# Patient Record
Sex: Male | Born: 1945 | Race: White | Hispanic: No | Marital: Married | Smoking: Former smoker
Health system: Southern US, Community
[De-identification: ages and names within clinical notes are randomized; demographics above are authoritative.]

## PROBLEM LIST (undated history)

## (undated) DIAGNOSIS — F419 Anxiety disorder, unspecified: Secondary | ICD-10-CM

## (undated) DIAGNOSIS — I2109 ST elevation (STEMI) myocardial infarction involving other coronary artery of anterior wall: Secondary | ICD-10-CM

## (undated) DIAGNOSIS — J45909 Unspecified asthma, uncomplicated: Secondary | ICD-10-CM

## (undated) DIAGNOSIS — K759 Inflammatory liver disease, unspecified: Secondary | ICD-10-CM

## (undated) DIAGNOSIS — I509 Heart failure, unspecified: Secondary | ICD-10-CM

## (undated) DIAGNOSIS — I4891 Unspecified atrial fibrillation: Secondary | ICD-10-CM

## (undated) DIAGNOSIS — E7800 Pure hypercholesterolemia, unspecified: Secondary | ICD-10-CM

## (undated) DIAGNOSIS — I429 Cardiomyopathy, unspecified: Secondary | ICD-10-CM

## (undated) DIAGNOSIS — I472 Ventricular tachycardia, unspecified: Secondary | ICD-10-CM

## (undated) DIAGNOSIS — E119 Type 2 diabetes mellitus without complications: Secondary | ICD-10-CM

## (undated) DIAGNOSIS — Z95810 Presence of automatic (implantable) cardiac defibrillator: Secondary | ICD-10-CM

## (undated) DIAGNOSIS — D649 Anemia, unspecified: Secondary | ICD-10-CM

## (undated) DIAGNOSIS — I2510 Atherosclerotic heart disease of native coronary artery without angina pectoris: Secondary | ICD-10-CM

## (undated) DIAGNOSIS — I442 Atrioventricular block, complete: Secondary | ICD-10-CM

## (undated) DIAGNOSIS — Z87442 Personal history of urinary calculi: Secondary | ICD-10-CM

## (undated) DIAGNOSIS — J189 Pneumonia, unspecified organism: Secondary | ICD-10-CM

## (undated) DIAGNOSIS — I10 Essential (primary) hypertension: Secondary | ICD-10-CM

## (undated) DIAGNOSIS — K819 Cholecystitis, unspecified: Secondary | ICD-10-CM

## (undated) HISTORY — DX: ST elevation (STEMI) myocardial infarction involving other coronary artery of anterior wall: I21.09

## (undated) HISTORY — DX: Anxiety disorder, unspecified: F41.9

## (undated) HISTORY — PX: CATARACT EXTRACTION W/ INTRAOCULAR LENS  IMPLANT, BILATERAL: SHX1307

## (undated) HISTORY — PX: CARDIAC CATHETERIZATION: SHX172

## (undated) HISTORY — PX: BI-VENTRICULAR IMPLANTABLE CARDIOVERTER DEFIBRILLATOR  (CRT-D): SHX5747

## (undated) HISTORY — PX: EYE SURGERY: SHX253

## (undated) HISTORY — PX: CORONARY ARTERY BYPASS GRAFT: SHX141

## (undated) HISTORY — DX: Atherosclerotic heart disease of native coronary artery without angina pectoris: I25.10

---

## 1999-12-27 ENCOUNTER — Encounter: Payer: Self-pay | Admitting: Cardiology

## 1999-12-27 ENCOUNTER — Ambulatory Visit (HOSPITAL_COMMUNITY): Admission: RE | Admit: 1999-12-27 | Discharge: 1999-12-27 | Payer: Self-pay | Admitting: Cardiology

## 2002-10-15 ENCOUNTER — Encounter (INDEPENDENT_AMBULATORY_CARE_PROVIDER_SITE_OTHER): Payer: Self-pay | Admitting: Cardiology

## 2002-10-15 ENCOUNTER — Ambulatory Visit (HOSPITAL_COMMUNITY): Admission: RE | Admit: 2002-10-15 | Discharge: 2002-10-15 | Payer: Self-pay | Admitting: Cardiology

## 2002-10-18 ENCOUNTER — Ambulatory Visit (HOSPITAL_COMMUNITY): Admission: RE | Admit: 2002-10-18 | Discharge: 2002-10-18 | Payer: Self-pay | Admitting: Cardiology

## 2002-10-18 ENCOUNTER — Encounter: Payer: Self-pay | Admitting: Cardiology

## 2003-01-07 ENCOUNTER — Ambulatory Visit (HOSPITAL_COMMUNITY): Admission: RE | Admit: 2003-01-07 | Discharge: 2003-01-07 | Payer: Self-pay | Admitting: Cardiology

## 2003-09-08 ENCOUNTER — Ambulatory Visit (HOSPITAL_COMMUNITY): Admission: RE | Admit: 2003-09-08 | Discharge: 2003-09-08 | Payer: Self-pay | Admitting: Cardiology

## 2003-09-26 ENCOUNTER — Ambulatory Visit (HOSPITAL_COMMUNITY): Admission: RE | Admit: 2003-09-26 | Discharge: 2003-09-26 | Payer: Self-pay | Admitting: Internal Medicine

## 2003-10-05 ENCOUNTER — Encounter: Admission: RE | Admit: 2003-10-05 | Discharge: 2003-10-05 | Payer: Self-pay | Admitting: Gastroenterology

## 2003-10-26 ENCOUNTER — Inpatient Hospital Stay (HOSPITAL_COMMUNITY): Admission: RE | Admit: 2003-10-26 | Discharge: 2003-10-27 | Payer: Self-pay | Admitting: Internal Medicine

## 2004-06-13 ENCOUNTER — Encounter: Admission: RE | Admit: 2004-06-13 | Discharge: 2004-06-13 | Payer: Self-pay | Admitting: Cardiology

## 2004-12-12 ENCOUNTER — Ambulatory Visit: Payer: Self-pay | Admitting: Internal Medicine

## 2005-12-03 ENCOUNTER — Ambulatory Visit: Payer: Self-pay | Admitting: Internal Medicine

## 2006-05-29 ENCOUNTER — Ambulatory Visit: Payer: Self-pay

## 2006-09-02 ENCOUNTER — Encounter: Admission: RE | Admit: 2006-09-02 | Discharge: 2006-09-02 | Payer: Self-pay | Admitting: Cardiology

## 2007-01-12 ENCOUNTER — Ambulatory Visit (HOSPITAL_COMMUNITY): Admission: RE | Admit: 2007-01-12 | Discharge: 2007-01-12 | Payer: Self-pay | Admitting: Cardiology

## 2007-03-24 ENCOUNTER — Encounter: Admission: RE | Admit: 2007-03-24 | Discharge: 2007-03-24 | Payer: Self-pay | Admitting: Gastroenterology

## 2007-03-26 ENCOUNTER — Ambulatory Visit: Payer: Self-pay | Admitting: Internal Medicine

## 2008-03-03 ENCOUNTER — Ambulatory Visit: Payer: Self-pay | Admitting: Internal Medicine

## 2008-03-09 ENCOUNTER — Ambulatory Visit: Payer: Self-pay | Admitting: Internal Medicine

## 2008-03-09 ENCOUNTER — Ambulatory Visit (HOSPITAL_COMMUNITY): Admission: RE | Admit: 2008-03-09 | Discharge: 2008-03-09 | Payer: Self-pay | Admitting: Internal Medicine

## 2008-03-11 ENCOUNTER — Ambulatory Visit: Payer: Self-pay | Admitting: Internal Medicine

## 2008-03-28 ENCOUNTER — Ambulatory Visit: Payer: Self-pay

## 2008-07-19 ENCOUNTER — Encounter: Payer: Self-pay | Admitting: Internal Medicine

## 2008-12-05 ENCOUNTER — Encounter: Admission: RE | Admit: 2008-12-05 | Discharge: 2008-12-05 | Payer: Self-pay | Admitting: Gastroenterology

## 2009-05-17 ENCOUNTER — Encounter (INDEPENDENT_AMBULATORY_CARE_PROVIDER_SITE_OTHER): Payer: Self-pay | Admitting: *Deleted

## 2009-10-02 ENCOUNTER — Encounter: Admission: RE | Admit: 2009-10-02 | Discharge: 2009-10-02 | Payer: Self-pay | Admitting: Gastroenterology

## 2010-01-24 ENCOUNTER — Encounter (INDEPENDENT_AMBULATORY_CARE_PROVIDER_SITE_OTHER): Payer: Self-pay | Admitting: *Deleted

## 2010-02-06 ENCOUNTER — Encounter: Payer: Self-pay | Admitting: Internal Medicine

## 2010-03-27 ENCOUNTER — Ambulatory Visit: Payer: Self-pay | Admitting: Internal Medicine

## 2010-03-28 DIAGNOSIS — I4891 Unspecified atrial fibrillation: Secondary | ICD-10-CM | POA: Insufficient documentation

## 2010-03-28 DIAGNOSIS — I5022 Chronic systolic (congestive) heart failure: Secondary | ICD-10-CM

## 2010-03-28 DIAGNOSIS — I10 Essential (primary) hypertension: Secondary | ICD-10-CM

## 2010-07-08 ENCOUNTER — Encounter: Payer: Self-pay | Admitting: Gastroenterology

## 2010-07-16 ENCOUNTER — Other Ambulatory Visit: Payer: Self-pay | Admitting: Cardiology

## 2010-07-16 DIAGNOSIS — I2510 Atherosclerotic heart disease of native coronary artery without angina pectoris: Secondary | ICD-10-CM

## 2010-07-17 NOTE — Cardiovascular Report (Signed)
Summary: Office Visit   Office Visit   Imported By: Roderic Ovens 03/30/2010 11:43:38  _____________________________________________________________________  External Attachment:    Type:   Image     Comment:   External Document

## 2010-07-17 NOTE — Assessment & Plan Note (Signed)
Summary: past due pacer ck   Visit Type:  Follow-up   History of Present Illness: Spencer Shelton returns today for followup.  He is a pleasant middle aged man with a h/o DCM, CHF, HTN and PAF.  He is s/p BiV ICD.  He returns today for followup.  He denies c/p.  He has class 2 CHF.  He denies peripheral edema. No intercurrent ICD therapies.  Current Medications (verified): 1)  Ursodiol 300 Mg Caps (Ursodiol) .... Two Times A Day 2)  Warfarin Sodium 6 Mg Tabs (Warfarin Sodium) .... Use As Directed By Anticoagulation Clinic 3)  Zetia 10 Mg Tabs (Ezetimibe) .... Take One Tablet By Mouth Daily. 4)  Digoxin 0.25 Mg Tabs (Digoxin) .... Take One Tablet By Mouth Daily 5)  Aspirin 81 Mg Tbec (Aspirin) .... Take One Tablet By Mouth Daily 6)  Benazepril Hcl 40 Mg Tabs (Benazepril Hcl) .... 1/2 Tab Two Times A Day 7)  Furosemide 80 Mg Tabs (Furosemide) .... Take One Tablet By Mouth Daily. 8)  Metoprolol Succinate 50 Mg Xr24h-Tab (Metoprolol Succinate) .... Take 1/2 Tablet By Mouth Two Times A Day 9)  Fish Oil   Oil (Fish Oil) .... Once Daily 10)  Diovan 160 Mg Tabs (Valsartan) .... Take One Tablet By Mouth Daily 11)  Multivitamins   Tabs (Multiple Vitamin) .... Once Daily  Allergies: 1)  ! * All Statins 2)  ! * Penicillin 3)  ! * Sulfa 4)  ! * Tricor  Past History:  Past Medical History: Last updated: 04/26/2009  Medtronic InSync device  Implant of Medtronic CONCERTO CRT-D system  Review of Systems       The patient complains of dyspnea on exertion.  The patient denies chest pain, syncope, and peripheral edema.    Vital Signs:  Patient profile:   65 year old male Height:      76 inches Weight:      197 pounds BMI:     24.07 Pulse rate:   68 / minute BP sitting:   110 / 70  (left arm)  Vitals Entered By: Laurance Flatten CMA (March 27, 2010 3:22 PM)  Physical Exam  General:  Well developed, well nourished, in no acute distress.  HEENT: normal Neck: supple. No JVD. Carotids 2+  bilaterally no bruits Cor: RRR no rubs, gallops or murmur.PMI is enlarged and laterally displaced.  Well healed ICD incision. Lungs: CTA Ab: soft, nontender. nondistended. No HSM. Good bowel sounds Ext: warm. no cyanosis, clubbing or edema Neuro: alert and oriented. Grossly nonfocal. affect pleasant     ICD Specifications ICD Vendor:  Medtronic     ICD Model Number:  [id number]     ICD Serial Number:  [id number] ICD DOI:  03/09/2008     ICD Implanting MD:  Lewayne Bunting, MD  Lead 1:    Location: RA     DOI: 10/26/2003     Model #: [id number]     Serial #: [id number]     Status: active Lead 2:    Location: RV     DOI: 10/26/2003     Model #: [id number]     Serial #: [id number]     Status: active Lead 3:    Location: LV     DOI: 10/26/2003     Model #: [id number]     Serial #: [id number]     Status: active  Indications::  ICM, AFIB, CHF   ICD Follow Up Battery Voltage:  3.10 V     Charge  Time:  10.2 seconds     Underlying rhythm:  SR   ICD Device Measurements Atrium:  Amplitude: 1.1 mV, Impedance: 456 ohms,  Right Ventricle:  Amplitude: 13.3 mV, Impedance: 496 ohms, Threshold: 1.0 V at 0.4 msec Left Ventricle:  Impedance: 416 ohms, Threshold: 1.0 V at 0.4 msec  Episodes MS Episodes:  1     Percent Mode Switch:  100%     Shock:  0     ATP:  0     Nonsustained:  1     Atrial Therapies:  0 Ventricular Pacing:  97.8%  Brady Parameters Mode VVIR     Lower Rate Limit:  70     Upper Rate Limit 120  Tachy Zones VF:  >188     VT:  162-188     Next Remote Date:  06/28/2010     Next Cardiology Appt Due:  03/18/2011 Tech Comments:  OPTIVOL INCREASE 03-07-10 THRU 03-13-10.  1 NST EPISODE.  PT IN AF 100% OF TIME. + COUMADIN NORMAL DEVICE FUNCTION.  CHANGED MAX LEAD IMPEDANCES FOR LIA AND TURNED 1:1 SVT ON. CARELINK 06-28-10 AND ROV IN 12 MTHS W/GT. Vella Kohler  March 27, 2010 3:41 PM MD Comments:  AGree with above.  Impression & Recommendations:  Problem # 1:  CHRONIC SYSTOLIC HEART FAILURE  (ICD-428.22) His symptoms remain class 2.  Continue a low sodium diet and medications as below. His updated medication list for this problem includes:    Warfarin Sodium 6 Mg Tabs (Warfarin sodium) ..... Use as directed by anticoagulation clinic    Digoxin 0.25 Mg Tabs (Digoxin) .[name]... Take one tablet by mouth daily    Aspirin 81 Mg Tbec (Aspirin) .[name]... Take one tablet by mouth daily    Benazepril Hcl 40 Mg Tabs (Benazepril hcl) .[name]... 1/2 tab two times a day    Furosemide 80 Mg Tabs (Furosemide) .[name]... Take one tablet by mouth daily.    Metoprolol Succinate 50 Mg Xr24h-tab (Metoprolol succinate) .[name]... Take 1/2 tablet by mouth two times a day    Diovan 160 Mg Tabs (Valsartan) .[name]... Take one tablet by mouth daily  Problem # 2:  ESSENTIAL HYPERTENSION, BENIGN (ICD-401.1) His blood pressure is well controlled.  Continue meds as below and maintain a low sodium diet.  His updated medication list for this problem includes:    Aspirin 81 Mg Tbec (Aspirin) .[name]... Take one tablet by mouth daily    Benazepril Hcl 40 Mg Tabs (Benazepril hcl) .[name]... 1/2 tab two times a day    Furosemide 80 Mg Tabs (Furosemide) .[name]... Take one tablet by mouth daily.    Metoprolol Succinate 50 Mg Xr24h-tab (Metoprolol succinate) .[name]... Take 1/2 tablet by mouth two times a day    Diovan 160 Mg Tabs (Valsartan) .[name]... Take one tablet by mouth daily  Patient Instructions: 1)  Your physician wants you to follow-up in: 12 months with Dr Court Joy will receive a reminder letter in the mail two months in advance. If you don't receive a letter, please call our office to schedule the follow-up appointment. 2)  [facility] 06/28/10

## 2010-07-17 NOTE — Letter (Signed)
Summary: Device-Delinquent Check  [facility], Main Office  [address]   [place], [place] [address]   Phone: [phone]  Fax: [phone]     January 24, 2010 MRN: [id number]   LANORRIS KALISZ [address] [place], [place]  [address]   Dear Mr. MOSS,  According to our records, you have not had your implanted device checked in the recommended period of time.  We are unable to determine appropriate device function without checking your device on a regular basis.  Please call our office to schedule an appointment, with Dr. Ladona Ridgel, as soon as possible.  If you are having your device checked by another physician, please call us so that we may update our records.  Thank you,  Altha Harm, LPN  January 24, 2010 1:51 PM  [facility]

## 2010-07-17 NOTE — Cardiovascular Report (Signed)
Summary: Certified Letter Delivered - Appt. made & Kept it  Certified Letter Delivered - Appt. made & Kept it   Imported By: Debby Freiberg 04/20/2010 10:01:46  _____________________________________________________________________  External Attachment:    Type:   Image     Comment:   External Document

## 2010-07-21 ENCOUNTER — Encounter: Payer: Self-pay | Admitting: Cardiology

## 2010-07-23 ENCOUNTER — Other Ambulatory Visit: Payer: Self-pay | Admitting: Cardiology

## 2010-07-23 ENCOUNTER — Ambulatory Visit
Admission: RE | Admit: 2010-07-23 | Discharge: 2010-07-23 | Disposition: A | Discharge status: Home / Self Care | Payer: MEDICARE | Source: Ambulatory Visit | Attending: Cardiology | Admitting: Cardiology

## 2010-07-23 ENCOUNTER — Other Ambulatory Visit: Payer: Self-pay

## 2010-07-23 DIAGNOSIS — I714 Abdominal aortic aneurysm, without rupture, unspecified: Secondary | ICD-10-CM

## 2010-07-23 DIAGNOSIS — I2510 Atherosclerotic heart disease of native coronary artery without angina pectoris: Secondary | ICD-10-CM

## 2010-10-30 NOTE — Op Note (Signed)
NAMEPIERO, MUSTARD NO.:  [id number]   MEDICAL RECORD NO.:  [id number]          PATIENT TYPE:  OIB   LOCATION:  2899                         FACILITY:  [facility]   PHYSICIAN:  Doylene Canning. Ladona Ridgel, MD    DATE OF BIRTH:  August 21, 1945   DATE OF PROCEDURE:  03/09/2008  DATE OF DISCHARGE:                               OPERATIVE REPORT   PROCEDURE PERFORMED:  Removal of previously implanted biventricular  implantable cardioverter-defibrillator, which is elective replacement  indicator and insertion of a new device with defibrillation threshold  testing.   INTRODUCTION:  The patient is a 62-year male with a longstanding  cardiomyopathy, severe LV dysfunction, congestive heart failure, and now  permanent AFib.  He is status post BiV ICD insertion approximately 4-1/2  years ago and now referred for ICD generator removal.  Of note, the  patient does have a [phone] Sprint Fidelis lead; however, it has functioned  normally, and after careful consideration of the options, it was deemed  most appropriate to remove the generator and insert a new one without  placing the new defibrillator lead or rate sensing lead, as his current  lead has been working satisfactorily.   PROCEDURE:  After informed consent was obtained, the patient was taken  to the Diagnostic EP Lab in a fasting state.  After usual preparation  and draping, intravenous midazolam was given for sedation.  A 30 mL of  the lidocaine was infiltrated over the old infraclavicular insertion  site.  A 7-cm incision was carried out over this region.  Electrocautery  was utilized to dissect down to the fascial plane.  Pocket was entered  without difficulty and electrocautery was utilized to free up the  generator from the pocket itself.  The generator was removed without  difficulty.  The leads were freed up from their fibrous adhesions, which  were fairly minimal.  The leads were evaluated.  The R-waves measured 25  millivolts and  the threshold was 0.6 volts at 0.5 milliseconds.  Pacing  impedance was 536 ohms and multiple impedances were obtained, they were  stable.  With demonstration of satisfactory pacing impedances, the old  Medtronic biventricular ICD was removed and the new Medtronic Concerto  BiV ICD serial number [id number] was connected to the atrial RV and LV  leads and placed back in the subcutaneous pocket.  Generator was secured  with silk suture.  The pocket was irrigated with copious amounts of  gentamicin irrigation.  Defibrillation threshold testing was carried  out.   After the patient was more deeply sedated with fentanyl and Versed, VF  was induced with a T-wave shock.  A 15-joule shock was delivered, which  terminated the VF and restored AFib.  At this point, no additional  defibrillation threshold testing was carried out and the incision was  closed with 2-0 Vicryl followed by a layer of 3-0 Vicryl.  Benzoin was  painted on the skin.  A pressure dressing was applied, and the patient  was returned to his room in satisfactory condition.   COMPLICATIONS:  There were no immediate procedure complications.  RESULTS:  Successful removal of previously implanted Medtronic BiV ICD,  which reached ERI and followed by successful insertion of a new device  without immediate procedure complication.      Doylene Canning. Ladona Ridgel, MD  Electronically Signed     GWT/MEDQ  D:  03/09/2008  T:  03/09/2008  Job:  [id number]   cc:   Georga Hacking, M.D.

## 2010-10-30 NOTE — Discharge Summary (Signed)
NAMESANUEL, LADNIER NO.:  [id number]   MEDICAL RECORD NO.:  [id number]          PATIENT TYPE:  OIB   LOCATION:  2899                         FACILITY:  [facility]   PHYSICIAN:  Doylene Canning. Ladona Ridgel, MD    DATE OF BIRTH:  May 08, 1946   DATE OF ADMISSION:  03/09/2008  DATE OF DISCHARGE:  03/09/2008                               DISCHARGE SUMMARY   ALLERGIES:  The patient has allergies to ALL STATINS, also to  PENICILLIN, SULFA, and TRICOR.   FINAL DIAGNOSES:  1. Explant of existing Medtronic InSync device, which is at elective      replacement indicator.  2. Implant of Medtronic CONCERTO CRT-D system, Dr. Lewayne Bunting.      Defibrillator threshold study, less than or equal to 15 joules.   SECONDARY DIAGNOSES:  1. Coronary artery disease.      a.     Coronary artery bypass grafting in 1985.      b.     Redo coronary artery bypass grafting in 1995.  2. Original implant of biventricular implantable cardioverter-      defibrillator in May 1995.  3. New York Heart Association class II congestive heart failure.  4. Ejection fraction 20% in April 2005.  5. Diabetes.  6. Dyslipidemia.  7. Atrial fibrillation, chronic Coumadin therapy.   PROCEDURE:  On March 09, 2008, cardioverter-defibrillator change  with explantation of the existing Medtronic InSync device and implant of  a Medtronic Concerto, which has cardiac resynchronization capability,  Dr. Lewayne Bunting.  Defibrillator threshold study was done, less than or  equal to 15 joules.   BRIEF HISTORY:  Mr. Granja is a 65 year old male.  He has a history of  premature atherosclerotic coronary artery disease.  His original  coronary artery bypass graft surgery was in 1985 after his first heart  attack.  He had a second heart attack in 1995 and had a redo bypass  surgery at that time.  A BiV ICD was implanted at that time as well in  May 2005.   The patient has ischemic cardiomyopathy, ejection fraction is 20%.  He  has a history of atrial fibrillation.  His New York Heart Association  class advances to III when he is in atrial fibrillation.  The patient  has a history of diabetes and dyslipidemia.   The patient awakened on Thursday, February 25, 2008.  He awakened  because there was beeping.  This has repeated itself every morning at  8:46 in the morning, this comes from his device.  The device was  interrogated on March 03, 2008, and found to be at elective  replacement indicator.  His 6949 right ventricular lead is intact on  interrogation.  The patient will present for ICD generator change by Dr.  Ladona Ridgel.   HOSPITAL COURSE:  The patient presented electively on March 09, 2008.  He had the existing cardioverter-defibrillator generator  explanted with implant of the new Medtronic Concerto device.  He has had  no postprocedural complication and was discharged on March 09, 2008.   He is asked to remove the  bandage on the morning of Thursday, March 10, 2008, and leave the incision open to the air.  He is asked to keep  his incision dry for the next 7 days and sponge bathe until Wednesday,  March 16, 2008.  He is to hold off taking Coumadin until Friday,  March 11, 2008, and then to restart it.  He has a new medication now  Keflex 500 mg tablets 1 tablet three times daily for the next 5 days.  He is also asked because of relative hypotension to hold off on taking  his afternoon Lopressor and Lotensin the afternoon of March 09, 2008.   Other medications are as follows:  1. He takes Coumadin 6 mg daily Tuesday through Sunday, but on Monday      9 mg.  2. Lanoxin 0.25 mg daily.  3. Lasix 40 mg twice daily.  4. Lopressor 50 mg tablets one-half tablet twice daily.  5. Lotensin 20 mg twice daily.  6. Aspirin 81 mg daily.  7. Zetia 10 mg daily.  8. Atacand 8 mg twice daily.  9. Omeprazole 20 mg daily.  10.Fish oil capsule 1250 mg daily.  11.Multivitamin daily.   12.Nitroglycerin 0.4 mg daily.   He has followup at [facility] on Thursday, March 31, 2008, at 9 o'clock.      Maple Mirza, PA      Doylene Canning. Ladona Ridgel, MD  Electronically Signed    GM/MEDQ  D:  03/09/2008  T:  03/10/2008  Job:  [id number]   cc:   Georga Hacking, M.D.

## 2010-10-30 NOTE — Cardiovascular Report (Signed)
NAMEMarland Kitchen  SAMER, DUTTON NO.:  [id number]   MEDICAL RECORD NO.:  [id number]          PATIENT TYPE:  OIB   LOCATION:  2852                         FACILITY:  [facility]   PHYSICIAN:  Georga Hacking, M.D.DATE OF BIRTH:  [birth year]   DATE OF PROCEDURE:  01/12/2007  DATE OF DISCHARGE:                            CARDIAC CATHETERIZATION   HISTORY:  65 year old male with previous bypass grafting in 1982 and  again in 1996.  He has had three hospital admissions with abdominal pain  and chest discomfort, and concern has arisen over an ischemic etiology  of this versus gallstones.  A previous Cardiolite scan has not shown  significant ischemia, but because of recurrent hospitalization, it was  recommended that he have catheterization to evaluate.   PROCEDURE:  Left heart catheterization with coronary angiograms, left  ventriculogram, saphenous vein graft angiograms x3, and internal mammary  artery angiogram.   PROCEDURE:  The right femoral artery was entered using a single anterior  needle wall stick.  The vein graft to obtuse marginal artery and to the  internal mammary graft were selected using a standard right catheter.  Right bypass catheter was used to select the right coronary artery and  diagonal branch.  The sheath was removed following the procedure in the  holding area.   HEMODYNAMIC DATA:  Aorta post-contrast was 110/53, LV post-contrast  110/213.   ANGIOGRAPHIC DATA:  Left ventriculogram:  Performed in the 30-degree RAO  projection.  Multiple defibrillator leads were noted from the  biventricular defibrillator.  The aortic valve was normal.  The mitral  valve is normal.  There was calcification of the aortic arch noted.  Left ventricular ejection fraction is estimated at about 35%.   Coronary arteries:  Arises to be normal and heavily calcified.  Left  main coronary artery occluded.  Right coronary artery occluded.   Saphenous vein graft to the obtuse  marginal artery is widely patent.  This is the graft that was placed in 1982.  The graft is widely patent  with the valve in the middle of it smooth and contains no significant  disease.  The vein graft to the diagonal branch arises off of the hood  of the graft to the obtuse marginal artery.  It is widely patent with  the valve in the midportion with no significant disease.  Saphenous vein  graft to the posterior descending artery is widely patent with patent  proximal and distal anastomotic site.  The vein was anastomosed to the  hood of a previously placed vein graft, where a nubbin of this is seen  that represents the old vein graft.  There are also two other nubbins  demonstrated that demonstrated the other nubbins of the vein grafts  placed in 1982.  The internal mammary graft to the LAD is widely patent.  The distal anastomotic site is widely patent.  The old sequential  saphenous vein graft to the diagonal and LAD is occluded at the aorta,  but the limb of the graft connecting the diagonal to the LAD retrograde  has a severe 99% stenosis and  fills retrograde after the insertion site  of the mammary arch to the LAD and fills another small diagonal branch.   IMPRESSION:  1. Occlusion of native coronary arteries.  2. Patent internal mammary graft to left anterior descending, patent      saphenous vein graft to diagonal, obtuse marginal, and posterior      descending artery.  3. Residual ischemia could exist in a small diagonal branch that fills      by the previous limb of the vein graft from the left anterior      descending to the diagonal, and in the proximal right coronary      system that fills retrograde from the previous bypass graft and has      a stenosis between the two.   RECOMMENDATIONS:  He will have PIPIDA scan done later on and will  continue to be treated medically.      Georga Hacking, M.D.  Electronically Signed     WST/MEDQ  D:  01/12/2007  T:   01/12/2007  Job:  [id number]   cc:   Reginia Forts, MD

## 2010-10-30 NOTE — Assessment & Plan Note (Signed)
[facility]                         ELECTROPHYSIOLOGY OFFICE NOTE   Spencer Shelton, Spencer Shelton                        MRN:          [id number]  DATE:03/03/2008                            DOB:          28-Aug-1945    ALLERGIES:  This patient has allergies to ALL STATINS, also to  PENICILLIN, SULFA, and TRICOR.   CARDIOLOGIST:  Georga Hacking, MD   ELECTROPHYSIOLOGIST:  Doylene Canning. Ladona Ridgel, MD   This is a 65 year old male.  He has a history of premature  atherosclerotic coronary artery disease.  He had an original heart  attack and subsequent coronary artery bypass graft surgery in 1985.  He  had a redo surgery after a second myocardial infarction in 1995.  He had  a BiV/ICD implant in May 2005 for ischemic cardiomyopathy and New York  Heart Association Class II congestive heart failure.  His ejection  fraction was 20%.  Catheterization was done in April 2005 just before  implant of his BiV/ICD.  The BiV/ICD is a Medtronic InSync device.  The  patient has a history of atrial fibrillation as well and when he is an  atrial fibrillation, he experiences New [place] Association Class is III.  He has diabetes and dyslipidemia.   The patient mentions that since his catheterization in April 2005, which  is on our chart, he has had other catheterizations, which showed that  his ejection fraction has improved.   Last Thursday, February 25, 2008, the patient awoke at 8:46 in the  morning, he is quite sure about that, because he was awakened by a  beeping sound.  This sound has repeated itself every morning at 8:46 and  it actually comes from his device.  This device was interrogated today  and it was shown to be at elective replacement indicator.  He has a 6949  right ventricular lead, which is intact at interrogation today.  The  patient presents for ICD generator change by Dr. Ladona Ridgel.   SOCIAL HISTORY:  The patient is married.  He is retired.  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  He does not smoke.  He does not take alcoholic beverages.  Interestingly enough, at the time  of his first heart attack, he weighed 326 pounds.  He lost 100 pounds in  40 days.  Currently, he weighs 203 pounds.  He is able to walk at will  and very active.   PHYSICAL EXAMINATION:  VITAL SIGNS:  Blood pressure is somewhat elevated  today at 155/80 and heart rate is 71.  This is concerning to him.  We  discussed the possibility of increasing his Atacand from 8 to 16 mg.  He  will take his blood pressure tonight when he gets back to [facility]  and he will be in contact with Dr. Donnie Aho regarding any changes in  changes in medications.  GENERAL:  Alert and oriented x3.  NECK:  Neck veins are relaxed.  No carotid bruits auscultated.  LUNGS:  Clear to auscultation bilaterally.  HEART:  Faintly regular rhythm.  ABDOMEN:  Soft and  nondistended.  Bowel sounds are present.  He shows no  lower extremity edema, and he has evidence of healing saphenous  venectomies.   MEDICATIONS:  1. Coumadin 6 mg daily, 9 mg on Monday.  2. Lanoxin 0.25 mg daily.  3. Latex and Lasix 40 mg twice daily.  4. Lopressor 50 mg one-and-a-half tablet twice daily.  5. Lotensin 20 mg twice daily.  6. Enteric-coated aspirin 81 mg daily.  7. Zetia 2 mg daily.  8. Atacand 8 mg daily.  9. Omeprazole 20 mg daily.  10.Fish oil daily.  11.Multivitamin daily.  12.He has nitroglycerin 0.4 mg sublingual as needed.   The patient will present at [facility] clock a.m. on March 09, 2008 for  pacemaker generator change.  If the patient needs a new lead to replace  the 6949, this will be decided by Dr. Ladona Ridgel.  In addition, the patient  is very concerned about his blood pressure today.  He notes his lunch at  [facility] seemed salty.  He will retake his blood pressure at home  tonight.  If it remains elevated he will call Dr York Spaniel office.  I  mentioned that he could easily increase his  atacand to l6 mg.      Maple Mirza, PA  Electronically Signed      Doylene Canning. Ladona Ridgel, MD  Electronically Signed   GM/MedQ  DD: 03/03/2008  DT: 03/04/2008  Job #: [id number]

## 2010-11-02 NOTE — Op Note (Signed)
   NAME:  Spencer Shelton, Spencer Shelton                           ACCOUNT NO.:  [id number]   MEDICAL RECORD NO.:  [id number]                   PATIENT TYPE:  OIB   LOCATION:  2899                                 FACILITY:  [facility]   PHYSICIAN:  Darden Palmer., M.D.         DATE OF BIRTH:  28-Jan-1946   DATE OF PROCEDURE:  01/07/2003  DATE OF DISCHARGE:                                 OPERATIVE REPORT   PROCEDURE:  Cardioversion.   INDICATIONS FOR PROCEDURE:  Atrial fibrillation. He has been on an increased  dose of amiodarone since the last attempt at cardioversion.   DESCRIPTION OF PROCEDURE:  The patient had anesthesia given by Dr. Arta Bruce in the short stay center with 350 mg of pentothal. Cardioversion was  done using AP patches with 3  sequential shocks of 300 to 360 and 360 mg. He  initially was not felt to have reverted to sinus rhythm, but subsequent EKG  showed the presence of small T-waves and a more regular rhythm which was  suggestive of sinus rhythm.   IMPRESSION:  Successful cardioversion of atrial fibrillation to sinus  rhythm.                                                Darden Palmer., M.D.    WST/MEDQ  D:  01/07/2003  T:  01/07/2003  Job:  [id number]

## 2010-11-02 NOTE — Op Note (Signed)
   NAME:  Spencer Shelton, Spencer Shelton                           ACCOUNT NO.:  [id number]   MEDICAL RECORD NO.:  [id number]                   PATIENT TYPE:  OIB   LOCATION:  2899                                 FACILITY:  [facility]   PHYSICIAN:  Darden Palmer., M.D.         DATE OF BIRTH:  1945/09/12   DATE OF PROCEDURE:  DATE OF DISCHARGE:  10/18/2002                                 OPERATIVE REPORT   PROCEDURE PERFORMED:  Electrical cardioversion.   INDICATIONS FOR PROCEDURE:  Atrial fibrillation.   This 65 year old male has had recurrent atrial fibrillation and  cardioversion three years ago.  He presented with recurrent atrial  fibrillation and has been on Coumadin with INR of 3.8 recently.  The patient  received anesthesia by Dr. Judie Petit with 450 mg of Pentothal.  Cardioversion was done using UP patches, 300W resulted in reversion to  atrial fibrillation but 360W successfully cardioverting to sinus rhythm.   IMPRESSION:  Successful cardioversion of atrial fibrillation.                                               Darden Palmer., M.D.    WST/MEDQ  D:  10/18/2002  T:  10/18/2002  Job:  [id number]

## 2010-11-02 NOTE — Procedures (Signed)
. [facility]  Patient:    Spencer Shelton, Spencer Shelton                        MRN: [id number] Proc. Date: 12/27/99 Adm. Date:  [id number] Attending:  Norman Clay                           Procedure Report  PROCEDURE:  Cardioversion.  HISTORY:  This 65 year old male with coronary artery disease and paroxysmal atrial fibrillation, developed CHF and worsening atrial fibrillation recently. He has been on amiodarone and Coumadin as an outpatient and is brought at this time for cardioversion.  DESCRIPTION OF PROCEDURE:  The patient received pentothal 250 mg under anesthetic monitoring by Janetta Hora. Gelene Mink, M.D.  Cardioversion was done using AP patches with 300 watts, resulting in reversion to sinus rhythm. There were occasional PVCs and junctional rhythm immediately after cardioversion.  He tolerated the procedure well.  IMPRESSION:  Successful cardioversion of atrial fibrillation. DD:  12/27/99 TD:  12/27/99 Job: [id number]

## 2010-11-02 NOTE — Cardiovascular Report (Signed)
NAME:  Spencer Shelton, Spencer Shelton                           ACCOUNT NO.:  [id number]   MEDICAL RECORD NO.:  [id number]                   PATIENT TYPE:  INP   LOCATION:  2899                                 FACILITY:  [facility]   PHYSICIAN:  W. Ashley Royalty., M.D.         DATE OF BIRTH:  [birth year]   DATE OF PROCEDURE:  09/26/2003  DATE OF DISCHARGE:                              CARDIAC CATHETERIZATION   HISTORY:  A 65 year old male with congestive heart failure and previous  bypass grafting nine years ago.  He has had worsening congestive heart  failure and some vague angina at times and study is done to evaluate for  graft patency prior to implantation of a permanent implantable defibrillator  and biventricular device.   PROCEDURE:  Left heart catheterization with coronary angiograms, left  ventriculogram, saphenous vein graft angiograms x3 and internal mammary  artery angiogram.   COMMENTS ABOUT PROCEDURE:  The patient tolerated the procedure well without  complications.   The vein graft to the right coronary artery and diagonal were selected using  a right coronary artery bypass catheter.  The internal mammary graft and the  graft to the OM were selected using a standard right catheter.  The patient  was due to have a defibrillator implanted later on today, but because of an  elevated white count and some sinusitis, it was opted to just take the  sheath out without closure because we did not want to leave a foreign body  in with possible infection and bring him back later on to do a defibrillator  implant.   HEMODYNAMIC DATA:  1. Aorta postcontrast:  136/60.  2. LV postcontrast:  136/16-26.   ANGIOGRAPHIC DATA:   LEFT VENTRICULOGRAM:  Performed in the 30 degree RAO projection.  The aortic  valve was normal.  The mitral valve appeared normal.  The left ventricle was  dilated.  There is severe global hypokinesis with an estimated ejection  fraction of 20%.  Coronary arteries are  heavily calcified.  The left main  coronary artery is occluded.  The right coronary artery is occluded  proximally.   Saphenous vein graft to the obtuse marginal branch:  This graft was  implanted in 1982 and is widely patent with a valve in the middle of it, but  it is smooth and contains no significant disease.   Saphenous vein graft to the diagonal branch is widely patent.  There is also  a valve in the mid portion with no significant disease.   Saphenous vein graft to the posterior descending artery:  The graft is  widely patent with a patent proximal and distal anastomotic site.  It  appears that this graft was anastomosed to the hood of the previously placed  vein graft whereas as also a nubbin of this is seen which represents the old  vein graft.   Left internal mammary graft to the LAD is widely patent.  The  distal  anastomotic site is widely patent.  The old sequential saphenous vein graft  to the diagonal and LAD is occluded at the aorta, but the limb of the graft  connecting the diagonal LAD is patent and fills retrograde after the  insertion site of the internal mammary to the LAD and fills another diagonal  branch.   Two other nubbins were demonstrated which represent the other nubbins of the  vein grafts placed in 1982.   IMPRESSION:  1. Occlusion of native coronary arteries.  2. Patent internal mammary graft to the left anterior descending and patent     saphenous vein grafts to the diagonal, obtuse marginal and posterior     descending artery.   RECOMMENDATIONS:  The patient will be observed following catheterization and  the sheath will be removed.  We will treat for sinusitis and also may need  to evaluate his elevated platelet count.  Following this, consider  implantation of a biventricular defibrillator.                                               Darden Palmer., M.D.    WST/MEDQ  D:  09/26/2003  T:  09/26/2003  Job:  [id number]

## 2010-11-02 NOTE — H&P (Signed)
[facility]  Patient:    Spencer Shelton, Spencer Shelton                       MRN: [id number] Adm. Date:  12/27/99 Attending:  Darden Palmer., M.D.                         History and Physical  REASON FOR ADMISSION:  For cardioversion.  HISTORY:  The patient is a 65 year old male with known history of coronary artery disease, congestive heart failure, and atrial fibrillation.  He had redo coronary artery bypass grafting in 1996 by Dr. Particia Lather, with a internal mammary graft to the LAD, a vein graft to the posterior descending, and a vein graft to the diagonal branch.  He had some atrial fibrillation at that time, and has been on chronic Coumadin since that time and has been on quinidine.  He had been getting along fairly well since that time, but presented to the office on Nov 02, 1999 with atrial fibrillation.  Lasix was increased at that time and he was noted to have significant edema.  An echocardiogram showed a dilated left ventricle with an EF of 30%.  He had distal anterior septal and apical and distal inferior hypokinesis.  The right ventricle was dilated, and there was thought to be severe tricuspid regurgitation and significant pulmonary hypertension.  Tonys quinidine was stopped and he was started on amiodarone.  He has now been anticoagulated, and has been on amiodarone now for over a month.  His dyspnea and breathing have improved significantly.  He developed fever about a week ago and had a headache and earache, and was placed on Zithromax and Claritin D.  He was later changed to plain Claritin and was also given a course of Augmentin.  He is feeling well at this time, but he is brought in at this time to treat atrial fibrillation with cardioversion.  PAST MEDICAL HISTORY:  Previous anterior infarctions.  He has had previous coronary artery disease with known occlusions of all of his native coronary arteries.  He has significant  situational stress and anxiety.  He has no history of diabetes or ulcers, no kidney stones.  PREVIOUS SURGICAL HISTORY:  Coronary artery bypass grafting in 1982 and again in 1996.  He had a wound infection of his right knee.  ALLERGIES:  SULFA and PENICILLIN.  CURRENT MEDICATIONS: 1. Lopressor 25 b.i.d. 2. Lotensin 10 mg b.i.d. 3. Lanoxin 0.25 mg daily. 4. Coumadin 5 mg Monday/Wednesday/Friday, 2.5 mg four days a week. 5. Multivitamins daily. 6. Lasix 40 mg b.i.d. 7. Amiodarone 200 mg b.i.d. 8. Tricor, which he stopped because of rash.  FAMILY AND SOCIAL HISTORY:  Are recorded in the old records.  REVIEW OF SYSTEMS:  He has had recent rash due to Tricor, for which he stopped it.  He has also complained of some fever and some nausea recently, which appears to have resolved.  He has not had cough productive of sputum. He has no diarrhea, constipation, or other GI problems.  PHYSICAL EXAMINATION:  GENERAL:  He is a pleasant male.  VITAL SIGNS:  Weight 204.  Blood pressure 120/73 and 120/77, pulse is 64 and irregular.  SKIN:  Warm and dry.  NECK:  There is no JVD noted today, no carotid bruits.  LUNGS:  Clear.  CARDIAC:  Irregular rhythm, no S3.  There is a 1-2/6 murmur.  ABDOMEN:  Soft,  nontender.  EXTREMITIES:  There are changes of chronic venous stasis noted with 1+ edema involving the left leg.  NEUROLOGIC:  Grossly normal.  GENITOURINARY AND RECTAL:  Deferred.  LABORATORY DATA:  A 12-lead ECG shows atrial fibrillation, right axis deviation, right bundle-branch block, old anteroseptal MI.  IMPRESSION: 1. Atrial fibrillation, for cardioversion, on amiodarone. 2. Congestive heart failure due to atrial fibrillation and previous MIs. 3. 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  Previous    coronary artery bypass grafting x 2. 4. Recent fever. 5. Chronic Coumadin anticoagulation.  RECOMMENDATIONS:  The patient is brought in at this time  for elective cardioversion.  The procedure was discussed with the patient fully, including risks of MI, death, or CVA, and he was willing to proceed. DD:  12/26/99 TD:  12/26/99 Job: [id number]

## 2010-11-02 NOTE — Op Note (Signed)
   NAME:  Spencer Shelton, Spencer Shelton                           ACCOUNT NO.:  [id number]   MEDICAL RECORD NO.:  [id number]                   PATIENT TYPE:  OIB   LOCATION:  2899                                 FACILITY:  [facility]   PHYSICIAN:  Darden Palmer., M.D.         DATE OF BIRTH:  1946-04-23   DATE OF PROCEDURE:  01/07/2003  DATE OF DISCHARGE:                                 OPERATIVE REPORT   PROCEDURE:  Cardioversion.   INDICATIONS FOR PROCEDURE:  Atrial fibrillation.   HISTORY OF PRESENT ILLNESS:  The patient is a 65 year old male who has been  in atrial fibrillation. His amiodarone dose was increased  and he was  brought back for another attempt at cardioversion.   DESCRIPTION OF PROCEDURE:  The patient underwent anesthesia by Dr. Arta Bruce in the short stay center with 350 mg of pentothal. Cardioversion was  done using AP patches with  sequential shocks of 300-360 and 360 watts. He  failed to revert to sinus rhythm with any of the shocks.   IMPRESSION:  Unsuccessful cardioversion for atrial fibrillation.                                                Darden Palmer., M.D.    WST/MEDQ  D:  01/07/2003  T:  01/07/2003  Job:  [id number]

## 2010-11-02 NOTE — Op Note (Signed)
NAMEMarland Kitchen  TELFORD, ARCHAMBEAU                           ACCOUNT NO.:  [id number]   MEDICAL RECORD NO.:  [id number]                   PATIENT TYPE:  INP   LOCATION:  2003                                 FACILITY:  [facility]   PHYSICIAN:  Doylene Canning. Ladona Ridgel, M.D.               DATE OF BIRTH:  1945-07-13   DATE OF PROCEDURE:  10/26/2003  DATE OF DISCHARGE:                                 OPERATIVE REPORT   PROCEDURE PERFORMED:  Implantation of a biventricular implantable  cardioverter-defibrillator.   INDICATIONS:  Ischemic cardiomyopathy, right bundle branch block, with left  posterior fascicular block, and class III heart failure, with persistent  atrial fibrillation.   INTRODUCTION:  The patient is a 65 year old male with a history of coronary  artery disease, status post bypass surgery now 20 years ago.  He has patent  grafts.  He has an EF of 25%.  He has class III heart failure with right  bundle branch block and left posterior fascicular block and is now referred  for bi-V ICD insertion.   PROCEDURE:  After informed consent was obtained, the patient was taken to  the diagnostic EP lab in a fasted state.  After the usual  preparation and  draping, intravenous fentanyl and midazolam were given for sedation.  Lidocaine 30 mL was infiltrated into the left infraclavicular region.  A 9  cm incision was carried out over this region and electrocautery utilized to  dissect down to the fascial plane.  The left subclavian vein was punctured  x3 with the Medtronic model 6949 65-cm active-fixation defibrillation lead,  serial number [id number], advanced into the right ventricle.  The Medtronic  model 5076 52-cm active-fixation lead, serial number [id number], advanced  into the right atrium.  Initially mapping was carried out in the right  ventricle and at the final site, the R-waves measured 12 mV with a pacing  impedance of 595 Ohms and a pacing threshold of 0.5 V at 0.5 msec. once the  lead was  actively fixed.  Ten-volt pacing did not stimulate the diaphragm.  At this point attention was turned to the atrial lead.  The patient was in  atrial fibrillation and it was placed in the lateral wall of the right  atrium, where fibrillation waves measured approximately 1 mV but varied  between 0.6 and 2 mV.  AOO pacing did not stimulate the diaphragm, and the  pacing impedance was 500 msec.  With both atrial and defibrillation leads in  a satisfactory position, attention was then turned to placement of the left  ventricular lead.  The coronary sinus was catheterized with a guiding  catheter and a CS EP catheter.  Venography of the coronary sinus was carried  out in the LAO and RAO projection.  This demonstrated a fairly moderate-  sized middle cardiac vein, very small posterior and posterolateral veins  unacceptable for bi-V pacing, and a slightly larger  lateral vein.  The Luge  angioplasty wire was then advanced by way of the guiding catheter into this  lateral vein.  The Medtronic model 4193 88-cm passive-fixation left  ventricular pacing lead, serial number [id number], was then advanced over  the guidewire into the lateral vein.  In this location, which was  approximately 2 o'clock on the mitral annulus, the pacing threshold was 1  volt at 0.5 msec with a pacing impedance of 448 Ohms.  Ten-volt pacing did  not stimulate the diaphragm at this location.  With the left ventricular and  right atrial and defibrillation leads in satisfactory position, they were  secured to the subpectoralis fascia with a figure-of-eight silk suture.  The  sewing sleeves were also secured with silk suture.  Electrocautery was then  utilized to make a subcutaneous pocket.  The Medtronic InSync Sentry model  [id number] biventricular defibrillator, serial number [id number], was then  connected to the atrial, LV, and defibrillation leads and placed back in the  subcutaneous pocket.  The generator was secured with a silk  suture and  defibrillation threshold testing carried out.   After the patient was more deeply sedated with fentanyl and Versed, VF was  induced with a T-wave shock.  Initially a 15-joule shock was delivered,  terminating both ventricular fibrillation and atrial fibrillation, and  restoring sinus rhythm.  At this point pacing thresholds were carried out in  the right atrium and found to be 1 V at 0.3 msec.  The P-waves measured 2.4  mV in the atrium in sinus rhythm.  Five minutes was allowed to elapse and a  second DFT test carried out.  Again VF was induced with a T-wave shock and a  10-joule shock then delivered, terminating ventricular fibrillation and  restoring sinus rhythm.  At this point additional kanamycin was utilized to  irrigate the incision and the incision closed with a layer of 2-0 Vicryl,  followed by a layer of 3-0 Vicryl, followed by a layer of 4-0 Vicryl.  Benzoin was painted on the skin and Steri-Strips were applied and a pressure  dressing placed, and the patient returned to his room in satisfactory  condition.   COMPLICATIONS:  There were no immediate procedural complications.   RESULTS:  This demonstrates successful implantation of a Medtronic  biventricular implantable cardioverter-defibrillator in a patient with class  III heart failure, ischemic cardiomyopathy, and bundle branch block with a  QRS duration of 158 msec.  The defibrillation threshold was less than or  equal to 10 joules.                                               Doylene Canning. Ladona Ridgel, M.D.    GWT/MEDQ  D:  10/26/2003  T:  10/27/2003  Job:  [id number]   cc:   W. Ashley Royalty., M.D.  [address]  [place]  [place] [address]  Fax: [phone]

## 2010-11-02 NOTE — Discharge Summary (Signed)
NAMEMarland Shelton  Spencer, Shelton                           ACCOUNT NO.:  [id number]   MEDICAL RECORD NO.:  [id number]                   PATIENT TYPE:  INP   LOCATION:  2003                                 FACILITY:  [facility]   PHYSICIAN:  Doylene Canning. Ladona Ridgel, M.D.               DATE OF BIRTH:  04/10/1946   DATE OF ADMISSION:  10/26/2003  DATE OF DISCHARGE:  10/27/2003                                 DISCHARGE SUMMARY   HISTORY OF PRESENT ILLNESS:  This is a 65 year old gentleman with a past  medical history of coronary artery disease, status post myocardial  infarction 20 years ago.  He had a CABG in 1985 with a redo in 1995 in the  setting of a second MI and developed postoperative atrial fibrillation.  He  has class II CHF but class III when he is atrial fibrillation and right  bundle branch block.  History of syncope and presyncope.  He is admitted for  placement of a biventricular ICD.   The patient underwent placement of Medtronics biventricular ICD and  tolerated the procedure well.  He had no immediate postoperative  complications and was discharged to home the following day in stable  condition.   DISCHARGE MEDICATIONS:  1. He was discharged on all of his previous medications amiodarone 200 mg     b.i.d.  2. Digoxin 0.25 mg q.d.  3. Lasix 40 mg b.i.d.  4. Lopressor 25 mg b.i.d.  5. Coated aspirin 81 mg q.d.  6. Lotensin 20 mg b.i.d.  7. Coumadin 3 mg nightly.  8. Tylenol 1-2 tablets q.4-6h. p.r.n. pain.   ACTIVITY:  Per the discharge sheet.  No driving for 10 days.   WOUND CARE:  Per the discharge sheet.   DIET:  He was to have a low fat, low salt, low cholesterol diet.   FOLLOW UP:  He is to be seen at the [facility] on Nov 11, 2003 at 9 a.m., Dr. Doylene Canning. Ladona Ridgel February 01, 2004 at 10:40 a.m.      Chinita Pester, C.R.N.P. [facility]                 Doylene Canning. Ladona Ridgel, M.D.    DS/MEDQ  D:  10/27/2003  T:  10/27/2003  Job:  [id number]   cc:   Doylene Canning. Ladona Ridgel, M.D.   Darden Palmer., M.D.  [address], [address]  [place]  [place] [address]  Fax: [phone]

## 2010-11-02 NOTE — Op Note (Signed)
NAME:  Spencer Shelton, Spencer Shelton                           ACCOUNT NO.:  [id number]   MEDICAL RECORD NO.:  [id number]                   PATIENT TYPE:  OIB   LOCATION:  2857                                 FACILITY:  [facility]   PHYSICIAN:  Darden Palmer., M.D.         DATE OF BIRTH:  1946-04-23   DATE OF PROCEDURE:  09/08/2003  DATE OF DISCHARGE:                                 OPERATIVE REPORT   PROCEDURE:  Cardioversion.   INDICATIONS FOR PROCEDURE:  Recurrent atrial fibrillation.   The patient was brought to the short stay center and was n.p.o. after  midnight.  After 200 mg of Pentothal was given by Dr. Diamantina Monks,  cardioversion was done using AP patches with 200 watts of biphasic  defibrillation using the Zoll defibrillator.  He converted to sinus rhythm  with a marked first degree AV block.  He tolerated this well and had no  complications.   IMPRESSION:  Successful cardioversion of atrial fibrillation.                                               Darden Palmer., M.D.    WST/MEDQ  D:  09/08/2003  T:  09/08/2003  Job:  [id number]

## 2010-11-02 NOTE — Consult Note (Signed)
NAME:  Spencer Shelton, Spencer Shelton                           ACCOUNT NO.:  [id number]   MEDICAL RECORD NO.:  [id number]                   PATIENT TYPE:  INP   LOCATION:  2899                                 FACILITY:  [facility]   PHYSICIAN:  Bernette Redbird, M.D.                DATE OF BIRTH:  03/04/1946   DATE OF CONSULTATION:  09/26/2003  DATE OF DISCHARGE:  09/26/2003                                   CONSULTATION   Dr. Donnie Aho asked me to see this 65 year old gentleman because of possible GI  diseases.   Spencer Shelton had an outpatient catheterization today with the intent of  placement of an implantable defibrillator. The main purpose for the  defibrillator, incidentally, was not any sort of malignant ventricular  arrhythmias but rather persistent atrial fibrillation.   It was noted on his preprocedural lab work that the patient had a white  count of 20,100 (no differential) and thrombocytosis with a platelet count  of 720,000. Because of the possibility of underlying GI malignancy being  reflected in this abnormal labs, it was elected to cancel the defibrillator  placement.   The patient does not have any recognized history of GI tract illnesses;  however, he has had a significant decreasing appetite over the past six  weeks. There is also been roughly 7-pound weight loss, but this is difficult  to access because of fluid retention in the abdomen with associated  diuresis. The patient's abdominal girth seems to have increased, but his  muscular size seems to have decreased.   On top of this, the patient has a six month history of hoarseness and  soreness in the back of his throat which leads to pain on swallowing. He  does not have esophageal odynophagia but rather the soreness is localized to  the back of the throat itself. There is no problem with dysphagia. There is  no problem with heartburn or reflux symptoms. No problem with eructation or  flatus. No problem with abdominal pain, melena, or  hematochezia. He does  tend toward constipation which is helped by the use of a couple of stool  softeners.   There is no family history of GI tract illnesses.   ALLERGIES:  PENICILLIN, SULFA, LIPITOR, and TRICOR.   OUTPATIENT MEDICATIONS:  1. Amiodarone.  2. Lopressor.  3. Lotensin.  4. Lanoxin.  5. Lasix.  6. Inspra.  7. Coumadin.  8. Daily baby aspirin.   OPERATION:  CABG x2.   MEDICAL ILLNESSES:  1. Coronary artery disease status post MI x2 (age of 83 and, I believe, 61).  2. Type 2 diabetes not requiring insulin, diet controlled, roughly 10 years     duration.  3. Hypercholesterolemia.  4. Atrial fibrillation.  5. Congestive heart failure with an ejection fraction of 20 to 25%.  6. Heart failure category 2 to 3.   HABITS:  Nonsmoker, but he formally smoked four packs per  day and was  substantially overweight at 321 pounds, now roughly 100 pounds less.   FAMILY HISTORY:  Negative for GI illnesses but positive for diabetes.   SOCIAL HISTORY:  The patient is married and is medically disabled. 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   REVIEW OF SYSTEMS:  Please see HPI. Note that the patient says the food  basically has no taste and is unappealing to him with about the only thing  that he can taste fairly well being ice cream.   PHYSICAL EXAMINATION:  GENERAL:  Basically unrevealing. Spencer Shelton is very  pleasant, lean but not frankly cachectic, Caucasian male with a pasty  complexion.  HEENT:  The oropharynx is benign. There is no scleral icterus or  conjunctival pallor. I do not appreciate any cervical adenopathy or  supraclavicular adenopathy.  CHEST:  Clear to auscultation.  HEART:  Slow rate around 60, soft holosystolic murmur along the left sternal  border with a S3 gallop.  ABDOMEN:  Liver and spleen nonpalpable. No guarding, mass, or tenderness. No  frank fluid wave or overt protuberance, but there is bilateral flank  dullness, and I think he probably  does have some ascites. No obvious  umbilical hernia.  RECTAL:  Not performed.  EXTREMITIES:  No peripheral edema. There is hyperpigmentation of the lower  shins.   LABORATORY DATA:  BUN 14, creatinine 1.2. albumin 2.5, bilirubin 1.5, AST  81, ALT 68, INR 1.9, white count 20,100 with no differential available.  Hemoglobin 12.4 with a MCV of 79 and a RDW of 16.2, platelets 720,000.   IMPRESSION:  1. Anorexia and weight loss.  2. Pharyngeal soreness and hoarseness.  3. Elevated liver chemistries and low albumin.  4. Leukocytosis and thrombocytosis.  5. Microcytic anemia, on long term aspirin and Coumadin.   DISCUSSION AND RECOMMENDATIONS:  The overall picture is nonspecific. The  patient clearly is having failure to thrive with his poor appetite, and it  is unclear whether his hoarseness and sore throat could be playing into  this. Possibilities which come to mind would include possible ENT  malignancy, reflux laryngitis, cardiac cirrhosis, cryptogenic cirrhosis, or  even hepatitis C possibly as a consequence of transfusions from his previous  CABG surgery. There is also the specter of occult malignancy. Finally, he  has some mild constipation which does not sound worrisome and is probably  related to his multiple medications.   PLAN:  1. Okay to resume Coumadin.  2. I have made arrangements for the patient to see me back in the office in     two days to do the following:     A. Iron studies and hepatitis serologies.     B. Home Hemoccult testing.     C. ENT referral.     D. Arrange CT scan of abdomen and pelvis.     E. Initiate MiraLax for laxation.     F. Consider upper GI series.   I appreciate the opportunity to have seen this patient in consultation with  you.                                               Bernette Redbird, M.D.    RB/MEDQ  D:  09/26/2003  T:  09/27/2003  Job:  [id number]   cc:   W. Ashley Royalty., M.D. [address]  [place]  [place]  [address]  Fax: [phone]   Loretta Plume, M.D.  [facility]

## 2011-01-07 ENCOUNTER — Telehealth: Payer: Self-pay | Admitting: Internal Medicine

## 2011-01-07 NOTE — Telephone Encounter (Signed)
Patient aware that pt is not in the office on July 30 th. Pt. Will keep his previous appointment date and time.

## 2011-01-07 NOTE — Telephone Encounter (Signed)
Left a message to call back.

## 2011-01-07 NOTE — Telephone Encounter (Signed)
Pt wants to move his appt up he is coming up on the 30th of July to see Dr. Donnie Aho and wants to come in that sameday to see Dr. Ladona Ridgel because he will be gone the whole month of Oct.

## 2011-03-18 LAB — PROTIME-INR: INR: 3.4 — ABNORMAL HIGH

## 2011-03-18 LAB — BASIC METABOLIC PANEL
Calcium: 9.4
Chloride: 104
Creatinine, Ser: 1.02
GFR calc Af Amer: >60
GFR calc non Af Amer: >60
Glucose, Bld: 146 — ABNORMAL HIGH
Potassium: 3.6

## 2011-03-18 LAB — APTT: aPTT: 59 — ABNORMAL HIGH

## 2011-03-18 LAB — CBC
MCHC: 34.2
MCV: 86.5
RBC: 4.92
WBC: 12.6 — ABNORMAL HIGH

## 2011-03-18 LAB — GLUCOSE, CAPILLARY: Glucose-Capillary: 180 — ABNORMAL HIGH

## 2011-04-01 LAB — BASIC METABOLIC PANEL
Calcium: 9.4
Chloride: 103
GFR calc Af Amer: >60
Glucose, Bld: 128 — ABNORMAL HIGH
Sodium: 139

## 2011-04-01 LAB — CBC
Hemoglobin: 13.7
MCHC: 33.7
MCV: 86.1
Platelets: 159
WBC: 9.1

## 2011-04-01 LAB — APTT: aPTT: 37

## 2011-04-01 LAB — PROTIME-INR: Prothrombin Time: 16.5 — ABNORMAL HIGH

## 2011-04-16 ENCOUNTER — Encounter: Payer: Self-pay | Admitting: Internal Medicine

## 2011-04-16 ENCOUNTER — Ambulatory Visit (INDEPENDENT_AMBULATORY_CARE_PROVIDER_SITE_OTHER): Payer: Medicare Other | Admitting: Internal Medicine

## 2011-04-16 DIAGNOSIS — I5022 Chronic systolic (congestive) heart failure: Secondary | ICD-10-CM

## 2011-04-16 DIAGNOSIS — I428 Other cardiomyopathies: Secondary | ICD-10-CM

## 2011-04-16 DIAGNOSIS — Z95810 Presence of automatic (implantable) cardiac defibrillator: Secondary | ICD-10-CM

## 2011-04-16 DIAGNOSIS — I4891 Unspecified atrial fibrillation: Secondary | ICD-10-CM

## 2011-04-16 LAB — ICD DEVICE OBSERVATION
AL IMPEDENCE ICD: 456 Ohm
BATTERY VOLTAGE: 3.04 V
LV LEAD IMPEDENCE ICD: 416 Ohm
LV LEAD THRESHOLD: 1 V

## 2011-04-16 NOTE — Assessment & Plan Note (Signed)
His symptoms remain class II. He admits to dietary indiscretion with sodium. Today we discussed the importance of obtaining daily weights and documenting them. Also we discussed what to do if the patient has an increase in weight of over 3 or 4 pounds. I asked that he double up or triple up on his Lasix dose if this should be the case. He will continue his current medical therapy at the present time. Of note the patient has been intolerant of beta blockers in the past.

## 2011-04-16 NOTE — Assessment & Plan Note (Signed)
His device is working normally. Battery is normal. We'll plan to recheck in several months.

## 2011-04-16 NOTE — Assessment & Plan Note (Signed)
His ventricular rate appears to be well-controlled. He will continue his current medical therapy. 

## 2011-04-16 NOTE — Progress Notes (Signed)
HPI [name] returns today for followup. He is a pleasant 65 year old man with a nonischemic cardiomyopathy, chronic systolic heart failure, chronic atrial fibrillation, status post biventricular ICD implantation. Since I saw him last a year ago he has done well. 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 He denies chest pain, shortness of breath, or syncope. He admits to dietary indiscretion with sodium and also notes occasional peripheral edema, particularly if he eats too much salt.  Allergies  Allergen Reactions  . Fenofibrate   . Penicillins   . Statins   . Sulfonamide Derivatives      Current Outpatient Prescriptions  Medication Sig Dispense Refill  . aspirin 81 MG tablet Take 81 mg by mouth daily.        . benazepril (LOTENSIN) 40 MG tablet Take 40 mg by mouth daily.        . Cinnamon 500 MG capsule Take 500 mg by mouth 2 (two) times daily.        . Cyanocobalamin (B-12 DOTS) 500 MCG TBDP Take by mouth daily.        . digoxin (LANOXIN) 0.25 MG tablet Take 250 mcg by mouth daily.        [name] ezetimibe (ZETIA) 10 MG tablet Take 10 mg by mouth daily.        . fish oil-omega-3 fatty acids 1000 MG capsule Take 2 g by mouth daily.        . furosemide (LASIX) 40 MG tablet Take 40 mg by mouth 2 (two) times daily.        . furosemide (LASIX) 80 MG tablet Take 40 mg by mouth 2 (two) times daily.        [name] glucosamine-chondroitin 500-400 MG tablet Take 1 tablet by mouth 2 (two) times daily.        . methazolamide (NEPTAZANE) 50 MG tablet Take 25 mg by mouth 2 (two) times daily.        . Multiple Vitamins-Minerals (MULTIVITAMIN WITH MINERALS) tablet Take 1 tablet by mouth daily.        . ursodiol (ACTIGALL) 300 MG capsule Take 300 mg by mouth 2 (two) times daily.        . valsartan (DIOVAN) 160 MG tablet Take 80 mg by mouth 2 (two) times daily.        [name] warfarin (COUMADIN) 6 MG tablet Take 6 mg by mouth. As directed           Past Medical History  Diagnosis Date  . Anterior myocardial  infarction   . Coronary artery disease     with known occlusions of all of his native coronary  . Anxiety     ROS:   All systems reviewed and negative except as noted in the HPI.   Past Surgical History  Procedure Date  . Coronary artery bypass grafting 1982/1996  . Implant of medtronic     CONCERTO CRT-D system     No family history on file.   History   Social History  . Marital Status: Married    Spouse Name: N/A    Number of Children: N/A  . Years of Education: N/A   Occupational History  . Retired    Social History Main Topics  . Smoking status: Never Smoker   . Smokeless tobacco: Not on file  . Alcohol Use: No  . Drug Use: No  . Sexually Active: Not on file   Other Topics Concern  . Not on file   Social History  Narrative  . No narrative on file     BP 132/62  Pulse 70  Ht 6\' 4"  (1.93 m)  Wt 199 lb (90.266 kg)  BMI 24.22 kg/m2  Physical Exam:  Well appearing middle aged man, NAD HEENT: Unremarkable Neck:  No JVD, no thyromegally Lymphatics:  No adenopathy Back:  No CVA tenderness Lungs:  Clear with no wheezes, rales, or rhonchi HEART:  Regular rate rhythm, no murmurs, no rubs, no clicks. PMI is enlarged. Abd:  soft, positive bowel sounds, no organomegally, no rebound, no guarding Ext:  2 plus pulses, no edema, no cyanosis, no clubbing Skin:  No rashes no nodules Neuro:  CN II through XII intact, motor grossly intact  DEVICE  Normal device function.  See PaceArt for details.   Assess/Plan:

## 2011-04-16 NOTE — Patient Instructions (Signed)
Your physician wants you to follow-up in: 6 months with Dr Taylor You will receive a reminder letter in the mail two months in advance. If you don't receive a letter, please call our office to schedule the follow-up appointment.  

## 2011-09-11 ENCOUNTER — Other Ambulatory Visit: Payer: Self-pay | Admitting: Gastroenterology

## 2011-09-11 DIAGNOSIS — K7460 Unspecified cirrhosis of liver: Secondary | ICD-10-CM

## 2011-09-24 ENCOUNTER — Ambulatory Visit
Admission: RE | Admit: 2011-09-24 | Discharge: 2011-09-24 | Disposition: A | Discharge status: Home / Self Care | Payer: Medicare Other | Source: Ambulatory Visit | Attending: Gastroenterology | Admitting: Gastroenterology

## 2011-09-24 DIAGNOSIS — K7460 Unspecified cirrhosis of liver: Secondary | ICD-10-CM

## 2012-03-18 ENCOUNTER — Encounter: Payer: Medicare Other | Admitting: Internal Medicine

## 2012-04-24 ENCOUNTER — Encounter: Payer: Medicare Other | Admitting: Internal Medicine

## 2012-04-29 ENCOUNTER — Encounter: Payer: Self-pay | Admitting: Cardiology

## 2012-04-29 ENCOUNTER — Ambulatory Visit (INDEPENDENT_AMBULATORY_CARE_PROVIDER_SITE_OTHER): Payer: Medicare Other | Admitting: Cardiology

## 2012-04-29 VITALS — BP 124/70 | Ht 76.0 in | Wt 199.0 lb

## 2012-04-29 DIAGNOSIS — I428 Other cardiomyopathies: Secondary | ICD-10-CM

## 2012-04-29 DIAGNOSIS — I4891 Unspecified atrial fibrillation: Secondary | ICD-10-CM

## 2012-04-29 DIAGNOSIS — Z95810 Presence of automatic (implantable) cardiac defibrillator: Secondary | ICD-10-CM

## 2012-04-29 NOTE — Progress Notes (Signed)
ELECTROPHYSIOLOGY OFFICE NOTE  Patient ID: [name] MRN: [id number], DOB/AGE: Jan 01, 1946   Date of Visit: 04/29/2012  Primary Physician: PCP in [place], [place] Primary Cardiologist: Viann Fish, MD Reason for Visit: Device follow-up  History of Present Illness Spencer Shelton is a pleasant 66 year old gentleman with ICM, chronic systolic HF s/p BiV ICD implant, permanent AFib and CAD who presents today for routine EP/device follow-up. He reports he is feeling well and has no complaints. He denies CP, SOB, palpitations, dizziness or syncope. He denies LE swelling, orthopnea or PND. He denies ICD shocks. He reports compliance with his medications.  Past Medical History  Diagnosis Date  . Anterior myocardial infarction   . Coronary artery disease     with known occlusions of all of his native coronary  . Anxiety     Past Surgical History  Procedure Date  . Coronary artery bypass grafting 1982/1996  . Implant of medtronic     CONCERTO CRT-D system     Allergies/Intolerances Allergies  Allergen Reactions  . Fenofibrate   . Penicillins   . Statins   . Sulfonamide Derivatives     Current Home Medications Current Outpatient Prescriptions  Medication Sig Dispense Refill  . aspirin 81 MG tablet Take 81 mg by mouth daily.        . benazepril (LOTENSIN) 40 MG tablet Take 40 mg by mouth daily.        . Cyanocobalamin (B-12 DOTS) 500 MCG TBDP Take by mouth daily.        . digoxin (LANOXIN) 0.25 MG tablet Take 250 mcg by mouth daily.        [name] ezetimibe (ZETIA) 10 MG tablet Take 10 mg by mouth daily.        . furosemide (LASIX) 40 MG tablet Take 40 mg by mouth 2 (two) times daily.       . metoprolol (LOPRESSOR) 50 MG tablet Take 25 mg by mouth 2 (two) times daily.      . Multiple Vitamins-Minerals (MULTIVITAMIN WITH MINERALS) tablet Take 1 tablet by mouth daily.        . ursodiol (ACTIGALL) 300 MG capsule Take 300 mg by mouth 2 (two) times daily.        . valsartan (DIOVAN)  160 MG tablet Take 80 mg by mouth 2 (two) times daily.        [name] warfarin (COUMADIN) 6 MG tablet Take 6 mg by mouth. As directed        . Cinnamon 500 MG capsule Take 500 mg by mouth 2 (two) times daily.        . fish oil-omega-3 fatty acids 1000 MG capsule Take 2 g by mouth daily.        [name] glucosamine-chondroitin 500-400 MG tablet Take 1 tablet by mouth 2 (two) times daily.        . methazolamide (NEPTAZANE) 50 MG tablet Take 25 mg by mouth 2 (two) times daily.          Social History Social History  . Marital Status: Married   Occupational History  . Retired; now lives in [place], [place]    Social History Main Topics  . Smoking status: Never Smoker   . Smokeless tobacco: Not on file  . Alcohol Use: No  . Drug Use: No   Review of Systems General: No chills, fever, night sweats or weight changes Cardiovascular: No chest pain, dyspnea on exertion, edema, orthopnea, palpitations, paroxysmal nocturnal dyspnea Dermatological: No  rash, lesions or masses Respiratory: No cough, dyspnea Urologic: No hematuria, dysuria Abdominal: No nausea, vomiting, diarrhea, bright red blood per rectum, melena, or hematemesis Neurologic: No visual changes, weakness, changes in mental status All other systems reviewed and are otherwise negative except as noted above.  Physical Exam Blood pressure 124/70, height 6\' 4"  (1.93 m), weight 199 lb (90.266 kg).  General: Well developed, well appearing 66 year old male in no acute distress. HEENT: Normocephalic, atraumatic. EOMs intact. Sclera nonicteric. Oropharynx clear.  Neck: Supple. No JVD. Lungs: Respirations regular and unlabored, CTA bilaterally. No wheezes, rales or rhonchi. Heart: Regular. S1, S2 present. No murmurs, rub, S3 or S4. Abdomen: Soft, non-distended.  Extremities: No clubbing, cyanosis or edema.  Psych: Normal affect. Neuro: Alert and oriented X 3. Moves all extremities spontaneously. Musculoskeletal: No kyphosis.    Diagnostics Device interrogation performed by me today shows normal BiV ICD function with good battery status and stable lead measurements/parameters; BiV pacing 98.8% of the time; in AF 100% of the time; underlying rhythm is AFib with a ventricular rate in the 40s; programmed VVIR; no VT/VF episodes; OptiVol was within normal range; histograms appropriate; no programming changes made today; see PaceArt report  Assessment and Plan 1. ICM with chronic systolic HF, BiV ICD in place Normal device function. Mr. Ryce is BiV pacing >98% of the time and his HF status remains stable. He appears euvolemic by exam. His AFib is rate controlled. He is anticoagulated. No programming changes were made. Continue current regimen. He is followed regularly by Dr. Donnie Aho. He will return to clinic for follow-up with Dr. Ladona Ridgel in one year unless needed sooner.  Signed, Rick Duff, PA-C 04/29/2012, 2:13 PM

## 2012-05-04 ENCOUNTER — Ambulatory Visit (INDEPENDENT_AMBULATORY_CARE_PROVIDER_SITE_OTHER): Payer: Medicare Other | Admitting: *Deleted

## 2012-05-04 DIAGNOSIS — R0989 Other specified symptoms and signs involving the circulatory and respiratory systems: Secondary | ICD-10-CM

## 2012-09-11 ENCOUNTER — Other Ambulatory Visit: Payer: Self-pay | Admitting: Gastroenterology

## 2012-09-11 DIAGNOSIS — K7460 Unspecified cirrhosis of liver: Secondary | ICD-10-CM

## 2012-09-21 ENCOUNTER — Ambulatory Visit
Admission: RE | Admit: 2012-09-21 | Discharge: 2012-09-21 | Disposition: A | Discharge status: Home / Self Care | Payer: Medicare Other | Source: Ambulatory Visit | Attending: Gastroenterology | Admitting: Gastroenterology

## 2012-09-21 DIAGNOSIS — K7460 Unspecified cirrhosis of liver: Secondary | ICD-10-CM

## 2013-09-06 ENCOUNTER — Other Ambulatory Visit: Payer: Self-pay | Admitting: Gastroenterology

## 2013-09-06 DIAGNOSIS — K7460 Unspecified cirrhosis of liver: Secondary | ICD-10-CM

## 2013-09-16 ENCOUNTER — Encounter: Payer: Self-pay | Admitting: *Deleted

## 2013-09-16 ENCOUNTER — Encounter: Payer: Self-pay | Admitting: Internal Medicine

## 2013-09-16 ENCOUNTER — Ambulatory Visit (INDEPENDENT_AMBULATORY_CARE_PROVIDER_SITE_OTHER): Payer: Medicare Other | Admitting: Internal Medicine

## 2013-09-16 ENCOUNTER — Ambulatory Visit
Admission: RE | Admit: 2013-09-16 | Discharge: 2013-09-16 | Disposition: A | Discharge status: Home / Self Care | Payer: Medicare Other | Source: Ambulatory Visit | Attending: Gastroenterology | Admitting: Gastroenterology

## 2013-09-16 ENCOUNTER — Telehealth: Payer: Self-pay | Admitting: Internal Medicine

## 2013-09-16 VITALS — BP 134/69 | HR 74 | Ht 76.0 in | Wt 194.0 lb

## 2013-09-16 DIAGNOSIS — I5022 Chronic systolic (congestive) heart failure: Secondary | ICD-10-CM

## 2013-09-16 DIAGNOSIS — I4891 Unspecified atrial fibrillation: Secondary | ICD-10-CM

## 2013-09-16 DIAGNOSIS — I4729 Other ventricular tachycardia: Secondary | ICD-10-CM

## 2013-09-16 DIAGNOSIS — K7460 Unspecified cirrhosis of liver: Secondary | ICD-10-CM

## 2013-09-16 DIAGNOSIS — Z95810 Presence of automatic (implantable) cardiac defibrillator: Secondary | ICD-10-CM

## 2013-09-16 DIAGNOSIS — I472 Ventricular tachycardia: Secondary | ICD-10-CM

## 2013-09-16 LAB — BASIC METABOLIC PANEL
BUN: 20 mg/dL (ref 6–23)
CALCIUM: 9.6 mg/dL (ref 8.4–10.5)
CO2: 30 meq/L (ref 19–32)
CREATININE: 1 mg/dL (ref 0.4–1.5)
Chloride: 101 mEq/L (ref 96–112)
GFR: 78.04 mL/min (ref >60.00)
Glucose, Bld: 147 mg/dL — ABNORMAL HIGH (ref 70–99)
Potassium: 4 mEq/L (ref 3.5–5.1)
SODIUM: 139 meq/L (ref 135–145)

## 2013-09-16 LAB — CBC WITH DIFFERENTIAL/PLATELET
BASOS ABS: 0 10*3/uL (ref 0.0–0.1)
Basophils Relative: 0.3 % (ref 0.0–3.0)
EOS ABS: 0.3 10*3/uL (ref 0.0–0.7)
Eosinophils Relative: 4.1 % (ref 0.0–5.0)
HCT: 41.5 % (ref 39.0–52.0)
Hemoglobin: 13.6 g/dL (ref 13.0–17.0)
LYMPHS PCT: 26.5 % (ref 12.0–46.0)
Lymphs Abs: 2.2 10*3/uL (ref 0.7–4.0)
MCHC: 32.9 g/dL (ref 30.0–36.0)
MCV: 89.1 fl (ref 78.0–100.0)
MONOS PCT: 11.3 % (ref 3.0–12.0)
Monocytes Absolute: 0.9 10*3/uL (ref 0.1–1.0)
NEUTROS PCT: 57.8 % (ref 43.0–77.0)
Neutro Abs: 4.9 10*3/uL (ref 1.4–7.7)
Platelets: 172 10*3/uL (ref 150.0–400.0)
RBC: 4.65 Mil/uL (ref 4.22–5.81)
RDW: 15 % — AB (ref 11.5–14.6)
WBC: 8.4 10*3/uL (ref 4.5–10.5)

## 2013-09-16 LAB — PROTIME-INR
INR: 1.7 ratio — ABNORMAL HIGH (ref 0.8–1.0)
Prothrombin Time: 18 s — ABNORMAL HIGH (ref 10.2–12.4)

## 2013-09-16 NOTE — Telephone Encounter (Signed)
Left message for patient that he may call me back to schedule but I'm off until Tues.  He can call me back then

## 2013-09-16 NOTE — Progress Notes (Signed)
HPI Spencer Shelton returns today for followup. He is a pleasant 68 yo man with chronic systolic heart failure, CHB, atrial fib, s/p BiV ICD implant. He has reached device ERI. He denies chest pain or sob. No edema. He has not had an ICD shock. He has a Medtronic [phone] ICD lead in place.  Allergies  Allergen Reactions  . Fenofibrate   . Penicillins   . Statins   . Sulfonamide Derivatives      Current Outpatient Prescriptions  Medication Sig Dispense Refill  . aspirin 81 MG tablet Take 81 mg by mouth daily.        . benazepril (LOTENSIN) 40 MG tablet Take 40 mg by mouth daily. 1/2 pill daily      . Cyanocobalamin (B-12 DOTS) 500 MCG TBDP Take by mouth daily.        . digoxin (LANOXIN) 0.25 MG tablet Take 250 mcg by mouth daily.        [name]. ezetimibe (ZETIA) 10 MG tablet Take 10 mg by mouth daily.        . furosemide (LASIX) 40 MG tablet Take 40 mg by mouth 2 (two) times daily.       . metoprolol (LOPRESSOR) 50 MG tablet Take 25 mg by mouth 2 (two) times daily.      . Multiple Vitamins-Minerals (MULTIVITAMIN WITH MINERALS) tablet Take 1 tablet by mouth daily.        [name]. omeprazole (PRILOSEC) 20 MG capsule       . pioglitazone (ACTOS) 15 MG tablet       . TRAVATAN Z 0.004 % SOLN ophthalmic solution       . ursodiol (ACTIGALL) 300 MG capsule Take 300 mg by mouth 2 (two) times daily.        . valsartan (DIOVAN) 160 MG tablet Take 80 mg by mouth daily.       [name]. warfarin (COUMADIN) 6 MG tablet Take 6 mg by mouth. As directed        . ALPHAGAN P 0.1 % SOLN        No current facility-administered medications for this visit.     Past Medical History  Diagnosis Date  . Anterior myocardial infarction   . Coronary artery disease     with known occlusions of all of his native coronary  . Anxiety     ROS:   All systems reviewed and negative except as noted in the HPI.   Past Surgical History  Procedure Laterality Date  . Coronary artery bypass grafting  1982/1996  . Implant of  medtronic      CONCERTO CRT-D system     No family history on file.   History   Social History  . Marital Status: Married    Spouse Name: N/A    Number of Children: N/A  . Years of Education: N/A   Occupational History  . Retired    Social History Main Topics  . Smoking status: Never Smoker   . Smokeless tobacco: Not on file  . Alcohol Use: No  . Drug Use: No  . Sexual Activity: Not on file   Other Topics Concern  . Not on file   Social History Narrative  . No narrative on file     BP 134/69  Pulse 74  Ht 6\' 4"  (1.93 m)  Wt 194 lb (87.998 kg)  BMI 23.62 kg/m2  Physical Exam:  Well appearing 68 yo man, NAD HEENT: Unremarkable Neck:  No JVD,  no thyromegally Back:  No CVA tenderness Lungs:  Clear with no wheezes HEART:  Regular rate rhythm, no murmurs, no rubs, no clicks Abd:  soft, positive bowel sounds, no organomegally, no rebound, no guarding Ext:  2 plus pulses, no edema, no cyanosis, no clubbing Skin:  No rashes no nodules Neuro:  CN II through XII intact, motor grossly intact  EKG - atrial fib with ventricular pacing  DEVICE  Normal device function.  See PaceArt for details. Device at [employer].   Assess/Plan:

## 2013-09-16 NOTE — Patient Instructions (Signed)
See instruction sheet for procedure 

## 2013-09-16 NOTE — Assessment & Plan Note (Signed)
His ventricular rate is well controlled. No change in meds. Will hold his warfarin for a single day before implant.

## 2013-09-16 NOTE — Assessment & Plan Note (Signed)
His symptoms are class 2. He will continue his current meds.  

## 2013-09-16 NOTE — Assessment & Plan Note (Signed)
His VT has been quiet. The last episode has been 5 years previously.

## 2013-09-16 NOTE — Telephone Encounter (Signed)
Patient needs to reschedule procedure the date doesn't work for him. Please call and advise.

## 2013-09-16 NOTE — Assessment & Plan Note (Signed)
His device has reached ERI. I have recommended we replace his device. He has a medtronic [id number] lead in place. If his subclavian vein is open, will place a new single coil ICD lead. If the vein is occluded, will change out can, and await any evidence for lead dysfunction before doing an extraction.

## 2013-09-21 ENCOUNTER — Telehealth: Payer: Self-pay | Admitting: Internal Medicine

## 2013-09-21 NOTE — Telephone Encounter (Signed)
Changed date to 10/08/13 and patient aware.  He will be at the hospital at 7:30am for a 9:30am case.

## 2013-09-21 NOTE — Telephone Encounter (Signed)
Explained to him again that Dr Ladona Ridgel only requested one day prior to procedure.  He is aware his last dose will be on the night of 10/06/13

## 2013-09-21 NOTE — Telephone Encounter (Signed)
New message    Confused about when to stop coumadin before his [employer]

## 2013-09-23 ENCOUNTER — Encounter: Payer: Self-pay | Admitting: Internal Medicine

## 2013-09-24 ENCOUNTER — Encounter (HOSPITAL_COMMUNITY): Payer: Self-pay | Admitting: Pharmacy Technician

## 2013-10-07 MED ORDER — VANCOMYCIN HCL IN DEXTROSE 1-5 GM/200ML-% IV SOLN
1000.0000 mg | INTRAVENOUS | Status: DC
  Filled 2013-10-07 (×2): qty 200

## 2013-10-07 MED ORDER — SODIUM CHLORIDE 0.9 % IR SOLN
80.0000 mg | Status: DC
  Filled 2013-10-07: qty 2

## 2013-10-08 ENCOUNTER — Ambulatory Visit (HOSPITAL_COMMUNITY)
Admission: RE | Admit: 2013-10-08 | Discharge: 2013-10-09 | Disposition: A | Discharge status: Home / Self Care | Payer: Medicare Other | Source: Ambulatory Visit | Attending: Internal Medicine | Admitting: Internal Medicine

## 2013-10-08 ENCOUNTER — Encounter (HOSPITAL_COMMUNITY)
Admission: RE | Disposition: A | Discharge status: Home / Self Care | Payer: Medicare Other | Source: Ambulatory Visit | Attending: Internal Medicine

## 2013-10-08 ENCOUNTER — Encounter (HOSPITAL_COMMUNITY): Payer: Self-pay | Admitting: *Deleted

## 2013-10-08 DIAGNOSIS — I2510 Atherosclerotic heart disease of native coronary artery without angina pectoris: Secondary | ICD-10-CM | POA: Insufficient documentation

## 2013-10-08 DIAGNOSIS — F411 Generalized anxiety disorder: Secondary | ICD-10-CM | POA: Insufficient documentation

## 2013-10-08 DIAGNOSIS — I4892 Unspecified atrial flutter: Secondary | ICD-10-CM | POA: Insufficient documentation

## 2013-10-08 DIAGNOSIS — I428 Other cardiomyopathies: Secondary | ICD-10-CM | POA: Insufficient documentation

## 2013-10-08 DIAGNOSIS — I472 Ventricular tachycardia, unspecified: Secondary | ICD-10-CM | POA: Insufficient documentation

## 2013-10-08 DIAGNOSIS — Z7901 Long term (current) use of anticoagulants: Secondary | ICD-10-CM | POA: Insufficient documentation

## 2013-10-08 DIAGNOSIS — Z4502 Encounter for adjustment and management of automatic implantable cardiac defibrillator: Secondary | ICD-10-CM | POA: Diagnosis present

## 2013-10-08 DIAGNOSIS — I252 Old myocardial infarction: Secondary | ICD-10-CM | POA: Diagnosis not present

## 2013-10-08 DIAGNOSIS — Z79899 Other long term (current) drug therapy: Secondary | ICD-10-CM | POA: Insufficient documentation

## 2013-10-08 DIAGNOSIS — I4729 Other ventricular tachycardia: Secondary | ICD-10-CM | POA: Insufficient documentation

## 2013-10-08 DIAGNOSIS — Z951 Presence of aortocoronary bypass graft: Secondary | ICD-10-CM | POA: Insufficient documentation

## 2013-10-08 DIAGNOSIS — I509 Heart failure, unspecified: Secondary | ICD-10-CM | POA: Insufficient documentation

## 2013-10-08 DIAGNOSIS — Z7982 Long term (current) use of aspirin: Secondary | ICD-10-CM | POA: Diagnosis not present

## 2013-10-08 DIAGNOSIS — Z95810 Presence of automatic (implantable) cardiac defibrillator: Secondary | ICD-10-CM

## 2013-10-08 DIAGNOSIS — I442 Atrioventricular block, complete: Secondary | ICD-10-CM | POA: Diagnosis not present

## 2013-10-08 DIAGNOSIS — I4891 Unspecified atrial fibrillation: Secondary | ICD-10-CM

## 2013-10-08 DIAGNOSIS — I5022 Chronic systolic (congestive) heart failure: Secondary | ICD-10-CM | POA: Insufficient documentation

## 2013-10-08 HISTORY — DX: Heart failure, unspecified: I50.9

## 2013-10-08 HISTORY — DX: Pneumonia, unspecified organism: J18.9

## 2013-10-08 HISTORY — PX: BIV ICD GENERTAOR CHANGE OUT: SHX5745

## 2013-10-08 HISTORY — DX: Essential (primary) hypertension: I10

## 2013-10-08 HISTORY — DX: Unspecified asthma, uncomplicated: J45.909

## 2013-10-08 HISTORY — DX: Pure hypercholesterolemia, unspecified: E78.00

## 2013-10-08 HISTORY — DX: Ventricular tachycardia, unspecified: I47.20

## 2013-10-08 HISTORY — DX: Ventricular tachycardia: I47.2

## 2013-10-08 HISTORY — DX: Cardiomyopathy, unspecified: I42.9

## 2013-10-08 HISTORY — DX: Atrioventricular block, complete: I44.2

## 2013-10-08 HISTORY — DX: Type 2 diabetes mellitus without complications: E11.9

## 2013-10-08 HISTORY — DX: Unspecified atrial fibrillation: I48.91

## 2013-10-08 HISTORY — PX: LEAD REVISION: SHX5945

## 2013-10-08 LAB — SURGICAL PCR SCREEN
MRSA, PCR: NEGATIVE
STAPHYLOCOCCUS AUREUS: NEGATIVE

## 2013-10-08 LAB — GLUCOSE, CAPILLARY
GLUCOSE-CAPILLARY: 151 mg/dL — AB (ref 70–99)
Glucose-Capillary: 128 mg/dL — ABNORMAL HIGH (ref 70–99)

## 2013-10-08 LAB — PROTIME-INR
INR: 2 — ABNORMAL HIGH (ref 0.00–1.49)
Prothrombin Time: 22.1 seconds — ABNORMAL HIGH (ref 11.6–15.2)

## 2013-10-08 SURGERY — BIV ICD GENERTAOR CHANGE OUT
Anesthesia: LOCAL

## 2013-10-08 MED ORDER — FENTANYL CITRATE 0.05 MG/ML IJ SOLN
INTRAMUSCULAR | Status: AC
  Filled 2013-10-08: qty 2

## 2013-10-08 MED ORDER — FUROSEMIDE 40 MG PO TABS
40.0000 mg | ORAL_TABLET | Freq: Two times a day (BID) | ORAL | Status: DC
  Administered 2013-10-08 – 2013-10-09 (×2): 40 mg via ORAL
  Filled 2013-10-08 (×4): qty 1

## 2013-10-08 MED ORDER — WARFARIN - PHYSICIAN DOSING INPATIENT: Freq: Every day | Status: DC

## 2013-10-08 MED ORDER — METOPROLOL TARTRATE 50 MG PO TABS
50.0000 mg | ORAL_TABLET | Freq: Two times a day (BID) | ORAL | Status: DC
Start: 2013-10-08 — End: 2013-10-09
  Administered 2013-10-08 – 2013-10-09 (×2): 50 mg via ORAL
  Filled 2013-10-08 (×3): qty 1

## 2013-10-08 MED ORDER — ASPIRIN EC 81 MG PO TBEC
81.0000 mg | DELAYED_RELEASE_TABLET | Freq: Every day | ORAL | Status: DC
  Administered 2013-10-09: 81 mg via ORAL
  Filled 2013-10-08: qty 1

## 2013-10-08 MED ORDER — HEPARIN (PORCINE) IN NACL 2-0.9 UNIT/ML-% IJ SOLN
INTRAMUSCULAR | Status: AC
  Filled 2013-10-08: qty 500

## 2013-10-08 MED ORDER — VANCOMYCIN HCL IN DEXTROSE 1-5 GM/200ML-% IV SOLN
1000.0000 mg | Freq: Two times a day (BID) | INTRAVENOUS | Status: AC
  Administered 2013-10-08: 1000 mg via INTRAVENOUS
  Filled 2013-10-08: qty 200

## 2013-10-08 MED ORDER — LIDOCAINE HCL (PF) 1 % IJ SOLN
INTRAMUSCULAR | Status: AC
  Filled 2013-10-08: qty 60

## 2013-10-08 MED ORDER — MIDAZOLAM HCL 5 MG/5ML IJ SOLN
INTRAMUSCULAR | Status: AC
  Filled 2013-10-08: qty 5

## 2013-10-08 MED ORDER — ACETAMINOPHEN 325 MG PO TABS
325.0000 mg | ORAL_TABLET | ORAL | Status: DC | PRN
  Administered 2013-10-08: 650 mg via ORAL
  Filled 2013-10-08: qty 2

## 2013-10-08 MED ORDER — DIGOXIN 250 MCG PO TABS
250.0000 ug | ORAL_TABLET | Freq: Every day | ORAL | Status: DC
  Administered 2013-10-09: 250 ug via ORAL
  Filled 2013-10-08: qty 1

## 2013-10-08 MED ORDER — MIDAZOLAM HCL 5 MG/5ML IJ SOLN
INTRAMUSCULAR | Status: AC
Start: 2013-10-08 — End: 2013-10-08
  Filled 2013-10-08: qty 5

## 2013-10-08 MED ORDER — PIOGLITAZONE HCL 15 MG PO TABS
15.0000 mg | ORAL_TABLET | Freq: Every day | ORAL | Status: DC
  Administered 2013-10-09: 15 mg via ORAL
  Filled 2013-10-08 (×2): qty 1

## 2013-10-08 MED ORDER — EZETIMIBE 10 MG PO TABS
10.0000 mg | ORAL_TABLET | Freq: Every day | ORAL | Status: DC
  Administered 2013-10-09: 10 mg via ORAL
  Filled 2013-10-08: qty 1

## 2013-10-08 MED ORDER — SODIUM CHLORIDE 0.9 % IV SOLN
INTRAVENOUS | Status: DC
  Administered 2013-10-08: 08:00:00 via INTRAVENOUS

## 2013-10-08 MED ORDER — BENAZEPRIL HCL 20 MG PO TABS
20.0000 mg | ORAL_TABLET | Freq: Two times a day (BID) | ORAL | Status: DC
  Administered 2013-10-08 – 2013-10-09 (×2): 20 mg via ORAL
  Filled 2013-10-08 (×3): qty 1

## 2013-10-08 MED ORDER — WARFARIN SODIUM 5 MG PO TABS
5.0000 mg | ORAL_TABLET | Freq: Every day | ORAL | Status: DC
  Administered 2013-10-08: 5 mg via ORAL
  Filled 2013-10-08 (×2): qty 1

## 2013-10-08 MED ORDER — IRBESARTAN 75 MG PO TABS
75.0000 mg | ORAL_TABLET | Freq: Every day | ORAL | Status: DC
  Administered 2013-10-09: 75 mg via ORAL
  Filled 2013-10-08: qty 1

## 2013-10-08 MED ORDER — MUPIROCIN 2 % EX OINT
TOPICAL_OINTMENT | Freq: Two times a day (BID) | CUTANEOUS | Status: DC
  Administered 2013-10-08: 1 via NASAL
  Filled 2013-10-08 (×2): qty 22

## 2013-10-08 MED ORDER — URSODIOL 300 MG PO CAPS
300.0000 mg | ORAL_CAPSULE | Freq: Two times a day (BID) | ORAL | Status: DC
  Administered 2013-10-08 – 2013-10-09 (×2): 300 mg via ORAL
  Filled 2013-10-08 (×3): qty 1

## 2013-10-08 MED ORDER — ONDANSETRON HCL 4 MG/2ML IJ SOLN
4.0000 mg | Freq: Four times a day (QID) | INTRAMUSCULAR | Status: DC | PRN
  Administered 2013-10-09: 4 mg via INTRAVENOUS
  Filled 2013-10-08: qty 2

## 2013-10-08 NOTE — Progress Notes (Signed)
Orthopedic Tech Progress Note Patient Details:  Spencer Shelton Sr. 08/20/1945 [id number]  Ortho Devices Type of Ortho Device: Arm sling Ortho Device/Splint Interventions: Application   Mickie Bail Cammer 10/08/2013, 2:28 PM

## 2013-10-08 NOTE — CV Procedure (Signed)
Electrophysiology procedure note  Procedure: Insertion of a new ICD lead, removal of a previous implanted biventricular ICD which had reached elective replacement, insertion of a new biventricular ICD, ICD pocket revision, and defibrillation threshold testing.  Indication: Long-standing nonischemic cardiomyopathy, chronic systolic heart failure, class II, ejection fraction 30%, complete heart block, and ventricular tachycardia  Description of procedure: After informed consent was obtained, the patient was taken to the diagnostic electrophysiology laboratory in the fasting state. After the usual preparation and draping, intravenous fentanyl and Versed were given for sedation. 30 cc of lidocaine was infiltrated into the left infraclavicular region. A 6 cm incision was carried out and electrocautery utilized to dissect down to the fascial plane. The left subclavian vein was punctured, but a guidewire could not be advanced as the vein was occluded. A glide wire was then utilized, and after approximately 20 minutes, to subtotal occlusions (there were 2 of them) of the subclavian vein and innominate vein were crossed, and the glide wire was advanced into the central circulation. An 8 [place] dilator was inserted over the Glidewire. At 035 Amplatz superstiff guidewire was advanced through the dilator and into the inferior vena cava. A 9 French long peel-away sheath was inserted over the guidewire. A Medtronic defibrillator lead, was advanced through the sheath and into the right ventricle. Initial mapping was carried out but R waves were quite small. There was difficulty in reaching the right ventricular apical septum. With manipulation of the catheter, ventricular tachycardia was induced, resulting in hemodynamic instability. The patient was defibrillated. Additional attempts to manipulate Medtronic defibrillator lead were unsuccessful, after approximately 20 minutes. At this point the [facility]. Jude active fixation 8  [place] defibrillator lead (Serial number [id number]) was inserted and through this sheath and into the right ventricle. The smaller St. Jude lead was successful in reaching the right ventricular apical septum where the R waves measured 9 mV. The patient threshold was a volt at 0.5 ms. The pacing impedance was 680 ohms. There is a large injury current with active fixation of the lead. With these satisfactory parameters, and after over an hour of work, the peel-away sheath was removed, and the lead was secured to the fascial plane with silk suture. The sewing sleeve was secured with silk suture. The pocket was irrigated with antibiotic irrigation. Electrocautery was utilized to assure hemostasis. The pocket was revised to accommodate the additional hardware and new device. After removal of the old ICD which had reached elective replacement, the new Medtronic biventricular ICD, (serial [id number]) was connected to the newly placed ICD, the old atrial lead, and the old left ventricular lead. The revised pocket was irrigated and the incision was closed with 2 layers of Vicryl suture. At this point I scrubbed out of the case and supervised defibrillation threshold testing.  After the patient was more deeply sedated under my direct supervision with additional Versed and fentanyl, ventricular fibrillation was induced with a T-wave shock. A 20 J shock was delivered terminated ventricular fibrillation and restored atrial fibrillation. No additional defibrillation threshold testing was carried out. Benzoin and Steri-Strips were pain on the skin, and the patient was returned to his room in satisfactory condition. This procedure was very difficult.  Complications: None immediately  Conclusion: Successful ICD lead revision, removal of a previously planted ICD, and insertion of a new ICD, with ICD testing and pocket revision.  Leonia Reeves.D.

## 2013-10-08 NOTE — Progress Notes (Signed)
UR Completed Aj Crunkleton Graves-Bigelow, RN,BSN [phone]  

## 2013-10-08 NOTE — Interval H&P Note (Signed)
History and Physical Interval Note:  10/08/2013 9:38 AM  [name]  has presented today for surgery, with the diagnosis of elective replacement  The various methods of treatment have been discussed with the patient and family. After consideration of risks, benefits and other options for treatment, the patient has consented to  Procedure(s): BIV ICD GENERTAOR CHANGE OUT (N/A) LEAD REVISION (N/A) as a surgical intervention .  The patient's history has been reviewed, patient examined, no change in status, stable for surgery.  I have reviewed the patient's chart and labs.  Questions were answered to the patient's satisfaction.     Joesph July.D.

## 2013-10-08 NOTE — H&P (Signed)
  ICD Criteria  Current LVEF:30% ;Obtained > 6 months ago.   NYHA Functional Classification: Class II  Heart Failure History:  Yes, Duration of heart failure since onset is > 9 months  Non-Ischemic Dilated Cardiomyopathy History:  Yes, timeframe is > 9 months  Atrial Fibrillation/Atrial Flutter:  Yes, A-Fib/A-Flutter type: Permanent (>1 year).  Ventricular Tachycardia History:  No.  Cardiac Arrest History:  No  History of Syndromes with Risk of Sudden Death:  No.  Previous ICD:  Yes, ICD Type:  CRT-D, Reason for ICD:  Primary prevention.  30%  Electrophysiology Study: No.  Prior MI: No.  PPM: No.  OSA:  No  Patient Life Expectancy of >=1 year: Yes.  Anticoagulation Therapy:  Patient is on anticoagulation therapy, anticoagulation was held prior to procedure.   Beta Blocker Therapy:  Yes.   Ace Inhibitor/ARB Therapy:  Yes.

## 2013-10-08 NOTE — Discharge Summary (Signed)
ELECTROPHYSIOLOGY PROCEDURE DISCHARGE SUMMARY    Patient ID: Spencer Houghony R Rietz Sr.,  MRN: [id number], DOB/AGE: 68/10/1945 68 y.o.  Admit date: 10/08/2013 Discharge date: 10/09/2013  Primary Care Physician: Pcp Not In System Primary Cardiologist: Donnie Ahoilley Electrophysiologist: Ladona Ridgelaylor  Primary Discharge Diagnosis:  Cardiomyopathy and congestive heart failure with previously implanted 6949 lead on manufacturer recall and CRTD at ERI, s/p RV lead revision and generator change this admission  Secondary Discharge Diagnosis:  1.  Permanent atrial fibrillation 2.  Complete heart block 3.  Ventricular tachycardia 4.  CAD - s/p CABG X2 with known occlusion of all native coronary arteries  Allergies  Allergen Reactions  . Fenofibrate   . Penicillins   . Statins   . Sulfonamide Derivatives      Procedures This Admission:  1.  Implantation of a MDT CRTD on 10-08-2013 by Dr Ladona Ridgelaylor.  The previously implanted 6949 RV lead was capped and a new STJ 7122 RV lead was placed.  The previously implanted RA and LV leads were used.  DFT's were successful at 20 J.  There were no immediate post procedure complications. 2.  CXR on 10-08-13 demonstrated no pneumothorax status post device implantation.   Brief HPI: Spencer Houghony R Koerner Sr. is a 68 y.o. male with a past medical history as listed above.  He previously underwent CRTD implantation in 2009.  His RV 6949 lead is on manufacturer recall and his device had reached elective replacement indicator.    Risks, benefits, and alternatives to ICD generator change and RV lead revision were reviewed with the patient who wished to proceed.   Hospital Course:  The patient was admitted and underwent implantation of a MDT CRTD with details as outlined above.   He was monitored on telemetry overnight which demonstrated atrial fibrillation with ventricular pacing.  Left chest was without hematoma or ecchymosis.  The device was interrogated and found to be functioning normally.   CXR was obtained and demonstrated no pneumothorax status post device implantation.  Wound care, arm mobility, and restrictions were reviewed with the patient.  Dr Johney FrameAllred examined the patient and considered them stable for discharge to home.   The patient's discharge medications include an ACE-I (Benazepril) and beta blocker (Metoprolol).   Discharge Vitals: Blood pressure 103/63, pulse 72, temperature 98.2 F (36.8 C), temperature source Oral, resp. rate 18, height 6\' 4"  (1.93 m), weight 187 lb (84.823 kg), SpO2 95.00%.  Physical Exam: Filed Vitals:   10/08/13 1330 10/08/13 1400 10/08/13 2120 10/09/13 0446  BP: 128/64 141/67 105/55 103/63  Pulse: 80 87 70 72  Temp:   97.7 F (36.5 C) 98.2 F (36.8 C)  TempSrc:   Oral Oral  Resp:      Height:      Weight:      SpO2:   96% 95%    GEN- The patient is well appearing, alert and oriented x 3 today.   Head- normocephalic, atraumatic Eyes-  Sclera clear, conjunctiva pink Ears- hearing intact Oropharynx- clear Neck- supple, no JVP Lymph- no cervical lymphadenopathy Lungs- Clear to ausculation bilaterally, normal work of breathing Heart- Regular rate and rhythm, no murmurs, rubs or gallops, PMI not laterally displaced GI- soft, NT, ND, + BS Extremities- no clubbing, cyanosis, or edema ICD  Pocket is without hematoma Neuro- strength and sensation are intact  Labs:   Lab Results  Component Value Date   WBC 8.4 09/16/2013   HGB 13.6 09/16/2013   HCT 41.5 09/16/2013   MCV 89.1 09/16/2013  PLT 172.0 09/16/2013   No results found for this basename: NA, K, CL, CO2, BUN, CREATININE, CALCIUM, LABALBU, PROT, BILITOT, ALKPHOS, ALT, AST, GLUCOSE,  in the last 168 hours No results found for this basename: CKTOTAL,  CKMB,  CKMBINDEX,  TROPONINI      Discharge Medications:    Medication List    ASK your doctor about these medications       ALPHAGAN P 0.1 % Soln  Generic drug:  brimonidine  Place 1 drop into both eyes 2 (two) times daily.      aspirin 81 MG tablet  Take 81 mg by mouth daily.     B-12 DOTS 500 MCG Tbdp  Generic drug:  Cyanocobalamin  Take 500 mg by mouth daily.     benazepril 40 MG tablet  Commonly known as:  LOTENSIN  Take 20 mg by mouth 2 (two) times daily.     digoxin 0.25 MG tablet  Commonly known as:  LANOXIN  Take 250 mcg by mouth daily.     ezetimibe 10 MG tablet  Commonly known as:  ZETIA  Take 10 mg by mouth daily.     furosemide 40 MG tablet  Commonly known as:  LASIX  Take 40 mg by mouth 2 (two) times daily.     metoprolol 50 MG tablet  Commonly known as:  LOPRESSOR  Take 50 mg by mouth 2 (two) times daily.     multivitamin with minerals tablet  Take 1 tablet by mouth daily.     pioglitazone 15 MG tablet  Commonly known as:  ACTOS  Take 15 mg by mouth daily.     TRAVATAN Z 0.004 % Soln ophthalmic solution  Generic drug:  Travoprost (BAK Free)  Place 1 drop into both eyes at bedtime.     ursodiol 300 MG capsule  Commonly known as:  ACTIGALL  Take 300 mg by mouth 2 (two) times daily.     valsartan 160 MG tablet  Commonly known as:  DIOVAN  Take 80 mg by mouth 2 (two) times daily.     warfarin 5 MG tablet  Commonly known as:  COUMADIN  Take 5 mg by mouth daily.        Disposition:   Future Appointments Provider Department Dept Phone   10/20/2013 4:30 PM [facility] [place] Office [phone]   01/20/2014 10:00 AM Marinus Maw, MD [facility] Office [phone]       Duration of Discharge Encounter: Greater than 30 minutes including physician time.  Signed,  Hillis Range MD

## 2013-10-08 NOTE — H&P (View-Only) (Signed)
    HPI Spencer Shelton returns today for followup. He is a pleasant 68 yo man with chronic systolic heart failure, CHB, atrial fib, s/p BiV ICD implant. He has reached device ERI. He denies chest pain or sob. No edema. He has not had an ICD shock. He has a Medtronic 6949 ICD lead in place.  Allergies  Allergen Reactions  . Fenofibrate   . Penicillins   . Statins   . Sulfonamide Derivatives      Current Outpatient Prescriptions  Medication Sig Dispense Refill  . aspirin 81 MG tablet Take 81 mg by mouth daily.        . benazepril (LOTENSIN) 40 MG tablet Take 40 mg by mouth daily. 1/2 pill daily      . Cyanocobalamin (B-12 DOTS) 500 MCG TBDP Take by mouth daily.        . digoxin (LANOXIN) 0.25 MG tablet Take 250 mcg by mouth daily.        . ezetimibe (ZETIA) 10 MG tablet Take 10 mg by mouth daily.        . furosemide (LASIX) 40 MG tablet Take 40 mg by mouth 2 (two) times daily.       . metoprolol (LOPRESSOR) 50 MG tablet Take 25 mg by mouth 2 (two) times daily.      . Multiple Vitamins-Minerals (MULTIVITAMIN WITH MINERALS) tablet Take 1 tablet by mouth daily.        . omeprazole (PRILOSEC) 20 MG capsule       . pioglitazone (ACTOS) 15 MG tablet       . TRAVATAN Z 0.004 % SOLN ophthalmic solution       . ursodiol (ACTIGALL) 300 MG capsule Take 300 mg by mouth 2 (two) times daily.        . valsartan (DIOVAN) 160 MG tablet Take 80 mg by mouth daily.       . warfarin (COUMADIN) 6 MG tablet Take 6 mg by mouth. As directed        . ALPHAGAN P 0.1 % SOLN        No current facility-administered medications for this visit.     Past Medical History  Diagnosis Date  . Anterior myocardial infarction   . Coronary artery disease     with known occlusions of all of his native coronary  . Anxiety     ROS:   All systems reviewed and negative except as noted in the HPI.   Past Surgical History  Procedure Laterality Date  . Coronary artery bypass grafting  1982/1996  . Implant of  medtronic      CONCERTO CRT-D system     No family history on file.   History   Social History  . Marital Status: Married    Spouse Name: N/A    Number of Children: N/A  . Years of Education: N/A   Occupational History  . Retired    Social History Main Topics  . Smoking status: Never Smoker   . Smokeless tobacco: Not on file  . Alcohol Use: No  . Drug Use: No  . Sexual Activity: Not on file   Other Topics Concern  . Not on file   Social History Narrative  . No narrative on file     BP 134/69  Pulse 74  Ht 6' 4" (1.93 m)  Wt 194 lb (87.998 kg)  BMI 23.62 kg/m2  Physical Exam:  Well appearing 68 yo man, NAD HEENT: Unremarkable Neck:  No JVD,   no thyromegally Back:  No CVA tenderness Lungs:  Clear with no wheezes HEART:  Regular rate rhythm, no murmurs, no rubs, no clicks Abd:  soft, positive bowel sounds, no organomegally, no rebound, no guarding Ext:  2 plus pulses, no edema, no cyanosis, no clubbing Skin:  No rashes no nodules Neuro:  CN II through XII intact, motor grossly intact  EKG - atrial fib with ventricular pacing  DEVICE  Normal device function.  See PaceArt for details. Device at [employer].   Assess/Plan:

## 2013-10-09 ENCOUNTER — Ambulatory Visit (HOSPITAL_COMMUNITY): Payer: Medicare Other

## 2013-10-09 DIAGNOSIS — I5022 Chronic systolic (congestive) heart failure: Secondary | ICD-10-CM

## 2013-10-09 DIAGNOSIS — Z4502 Encounter for adjustment and management of automatic implantable cardiac defibrillator: Secondary | ICD-10-CM | POA: Diagnosis not present

## 2013-10-09 DIAGNOSIS — Z95810 Presence of automatic (implantable) cardiac defibrillator: Secondary | ICD-10-CM

## 2013-10-09 DIAGNOSIS — I4891 Unspecified atrial fibrillation: Secondary | ICD-10-CM

## 2013-10-09 NOTE — Discharge Instructions (Signed)
° ° °  Supplemental Discharge Instructions for  Pacemaker/Defibrillator Patients  Activity No heavy lifting or vigorous activity with your left/right arm for 6 to 8 weeks.  Do not raise your left/right arm above your head for one week.  Gradually raise your affected arm as drawn below.           __  NO DRIVING for 1 week  WOUND CARE   Keep the wound area clean and dry.  Do not get this area wet for one week.     The tape/steri-strips on your wound will fall off; do not pull them off.  No bandage is needed on the site.  DO  NOT apply any creams, oils, or ointments to the wound area.   If you notice any drainage or discharge from the wound, any swelling or bruising at the site, or you develop a fever > 101? F after you are discharged home, call the office at once.  Special Instructions   You are still able to use cellular telephones; use the ear opposite the side where you have your pacemaker/defibrillator.  Avoid carrying your cellular phone near your device.   When traveling through airports, show security personnel your identification card to avoid being screened in the metal detectors.  Ask the security personnel to use the hand wand.   Avoid arc welding equipment, MRI testing (magnetic resonance imaging), TENS units (transcutaneous nerve stimulators).  Call the office for questions about other devices.   Avoid electrical appliances that are in poor condition or are not properly grounded.   Microwave ovens are safe to be near or to operate.  Additional information for defibrillator patients should your device go off:   If your device goes off ONCE and you feel fine afterward, notify the device clinic nurses.   If your device goes off ONCE and you do not feel well afterward, call 911.   If your device goes off TWICE, call 911.   If your device goes off THREE times in one day, call 911.  DO NOT DRIVE YOURSELF OR A FAMILY MEMBER WITH A DEFIBRILLATOR TO THE HOSPITAL--CALL 911.

## 2013-10-10 ENCOUNTER — Encounter (HOSPITAL_COMMUNITY): Payer: Self-pay | Admitting: *Deleted

## 2013-10-15 HISTORY — PX: OTHER SURGICAL HISTORY: SHX169

## 2013-10-20 ENCOUNTER — Ambulatory Visit: Payer: Medicare Other

## 2013-10-20 ENCOUNTER — Telehealth: Payer: Self-pay | Admitting: Internal Medicine

## 2013-10-20 NOTE — Telephone Encounter (Signed)
New Message:  Pt's wife called and states her husband is in the hospital at [facility].... They are unable to make his appt today.[name] He has pneumonia. Wife wants to know what they need to do with his wound care. Can they have his stitches taken out? She is requesting a call back from the nurse. She states the best number to reach her is on her cell  - [phone].

## 2013-10-20 NOTE — Telephone Encounter (Signed)
Follow up     Have not heard back from anyone regarding pt.  He had to cancel his wound check appt because he has pheumonia.  What should he do as far as his wound care.  He is not in the hospital but they live in [facility] and he is too sick to travel here.  Please call today.

## 2013-10-21 NOTE — Telephone Encounter (Signed)
Local MD will remove bandages due to pt's pneumonia. Pt understands per his local MD's opinion on incision he can then shower. Pt understands arm movements & lifting restrictions.  ROV w/ device clinic for wound check on 10/25/13.

## 2013-10-25 ENCOUNTER — Encounter (HOSPITAL_COMMUNITY): Payer: Self-pay | Admitting: Emergency Medicine

## 2013-10-25 ENCOUNTER — Ambulatory Visit: Payer: Medicare Other

## 2013-10-25 DIAGNOSIS — F411 Generalized anxiety disorder: Secondary | ICD-10-CM | POA: Diagnosis present

## 2013-10-25 DIAGNOSIS — K7460 Unspecified cirrhosis of liver: Secondary | ICD-10-CM | POA: Diagnosis present

## 2013-10-25 DIAGNOSIS — N179 Acute kidney failure, unspecified: Secondary | ICD-10-CM | POA: Diagnosis present

## 2013-10-25 DIAGNOSIS — I472 Ventricular tachycardia, unspecified: Secondary | ICD-10-CM | POA: Diagnosis present

## 2013-10-25 DIAGNOSIS — I5022 Chronic systolic (congestive) heart failure: Secondary | ICD-10-CM | POA: Diagnosis present

## 2013-10-25 DIAGNOSIS — Z7901 Long term (current) use of anticoagulants: Secondary | ICD-10-CM

## 2013-10-25 DIAGNOSIS — K8062 Calculus of gallbladder and bile duct with acute cholecystitis without obstruction: Secondary | ICD-10-CM | POA: Diagnosis present

## 2013-10-25 DIAGNOSIS — Z961 Presence of intraocular lens: Secondary | ICD-10-CM

## 2013-10-25 DIAGNOSIS — Z79899 Other long term (current) drug therapy: Secondary | ICD-10-CM

## 2013-10-25 DIAGNOSIS — E119 Type 2 diabetes mellitus without complications: Secondary | ICD-10-CM | POA: Diagnosis present

## 2013-10-25 DIAGNOSIS — Z87891 Personal history of nicotine dependence: Secondary | ICD-10-CM

## 2013-10-25 DIAGNOSIS — I2510 Atherosclerotic heart disease of native coronary artery without angina pectoris: Secondary | ICD-10-CM | POA: Diagnosis present

## 2013-10-25 DIAGNOSIS — D72829 Elevated white blood cell count, unspecified: Secondary | ICD-10-CM | POA: Diagnosis present

## 2013-10-25 DIAGNOSIS — E860 Dehydration: Secondary | ICD-10-CM | POA: Diagnosis present

## 2013-10-25 DIAGNOSIS — A419 Sepsis, unspecified organism: Principal | ICD-10-CM | POA: Diagnosis present

## 2013-10-25 DIAGNOSIS — Z951 Presence of aortocoronary bypass graft: Secondary | ICD-10-CM

## 2013-10-25 DIAGNOSIS — I4891 Unspecified atrial fibrillation: Secondary | ICD-10-CM | POA: Diagnosis present

## 2013-10-25 DIAGNOSIS — Z95810 Presence of automatic (implantable) cardiac defibrillator: Secondary | ICD-10-CM

## 2013-10-25 DIAGNOSIS — I2589 Other forms of chronic ischemic heart disease: Secondary | ICD-10-CM | POA: Diagnosis present

## 2013-10-25 DIAGNOSIS — I509 Heart failure, unspecified: Secondary | ICD-10-CM | POA: Diagnosis present

## 2013-10-25 DIAGNOSIS — J189 Pneumonia, unspecified organism: Secondary | ICD-10-CM | POA: Diagnosis present

## 2013-10-25 DIAGNOSIS — R791 Abnormal coagulation profile: Secondary | ICD-10-CM | POA: Diagnosis present

## 2013-10-25 DIAGNOSIS — Z7982 Long term (current) use of aspirin: Secondary | ICD-10-CM

## 2013-10-25 DIAGNOSIS — E785 Hyperlipidemia, unspecified: Secondary | ICD-10-CM | POA: Diagnosis present

## 2013-10-25 DIAGNOSIS — Z9849 Cataract extraction status, unspecified eye: Secondary | ICD-10-CM

## 2013-10-25 DIAGNOSIS — I252 Old myocardial infarction: Secondary | ICD-10-CM

## 2013-10-25 DIAGNOSIS — I10 Essential (primary) hypertension: Secondary | ICD-10-CM | POA: Diagnosis present

## 2013-10-25 DIAGNOSIS — I4729 Other ventricular tachycardia: Secondary | ICD-10-CM | POA: Diagnosis present

## 2013-10-25 DIAGNOSIS — Z880 Allergy status to penicillin: Secondary | ICD-10-CM

## 2013-10-25 LAB — CBC WITH DIFFERENTIAL/PLATELET
BLASTS: 0 %
Band Neutrophils: 8 % (ref 0–10)
Basophils Absolute: 0 10*3/uL (ref 0.0–0.1)
Basophils Relative: 0 % (ref 0–1)
Eosinophils Absolute: 0 10*3/uL (ref 0.0–0.7)
Eosinophils Relative: 0 % (ref 0–5)
HCT: 31.9 % — ABNORMAL LOW (ref 39.0–52.0)
Hemoglobin: 11 g/dL — ABNORMAL LOW (ref 13.0–17.0)
Lymphocytes Relative: 3 % — ABNORMAL LOW (ref 12–46)
Lymphs Abs: 0.9 10*3/uL (ref 0.7–4.0)
MCH: 28.9 pg (ref 26.0–34.0)
MCHC: 34.5 g/dL (ref 30.0–36.0)
MCV: 83.7 fL (ref 78.0–100.0)
METAMYELOCYTES PCT: 1 %
MONOS PCT: 4 % (ref 3–12)
Monocytes Absolute: 1.2 10*3/uL — ABNORMAL HIGH (ref 0.1–1.0)
Myelocytes: 0 %
NRBC: 0 /100{WBCs}
Neutro Abs: 26.8 10*3/uL — ABNORMAL HIGH (ref 1.7–7.7)
Neutrophils Relative %: 84 % — ABNORMAL HIGH (ref 43–77)
PLATELETS: 338 10*3/uL (ref 150–400)
PROMYELOCYTES ABS: 0 %
RBC: 3.81 MIL/uL — ABNORMAL LOW (ref 4.22–5.81)
RDW: 15.6 % — AB (ref 11.5–15.5)
WBC: 28.9 10*3/uL — AB (ref 4.0–10.5)

## 2013-10-25 LAB — COMPREHENSIVE METABOLIC PANEL
ALT: 26 U/L (ref 0–53)
AST: 39 U/L — AB (ref 0–37)
Albumin: 2.6 g/dL — ABNORMAL LOW (ref 3.5–5.2)
Alkaline Phosphatase: 105 U/L (ref 39–117)
BUN: 67 mg/dL — ABNORMAL HIGH (ref 6–23)
CALCIUM: 8.6 mg/dL (ref 8.4–10.5)
CO2: 26 meq/L (ref 19–32)
Chloride: 90 mEq/L — ABNORMAL LOW (ref 96–112)
Creatinine, Ser: 1.66 mg/dL — ABNORMAL HIGH (ref 0.50–1.35)
GFR calc Af Amer: 47 mL/min — ABNORMAL LOW (ref >90)
GFR, EST NON AFRICAN AMERICAN: 41 mL/min — AB (ref >90)
Glucose, Bld: 209 mg/dL — ABNORMAL HIGH (ref 70–99)
POTASSIUM: 4.6 meq/L (ref 3.7–5.3)
SODIUM: 127 meq/L — AB (ref 137–147)
Total Bilirubin: 0.5 mg/dL (ref 0.3–1.2)
Total Protein: 6.7 g/dL (ref 6.0–8.3)

## 2013-10-25 NOTE — ED Notes (Signed)
Had a CT test done today which showed gall stones-pt c/o abdominal pain-recently dx with PNA and had a pacemaker placed. Called Dr. Donnie Aho and was told to come here and be admitted for gall stones. Pt is pale, slightly sallow.

## 2013-10-26 ENCOUNTER — Inpatient Hospital Stay (HOSPITAL_COMMUNITY)
Admission: EM | Admit: 2013-10-26 | Discharge: 2013-10-31 | DRG: 871 | Disposition: A | Discharge status: Home / Self Care | Payer: Medicare Other | Attending: Internal Medicine | Admitting: Internal Medicine

## 2013-10-26 ENCOUNTER — Emergency Department (HOSPITAL_COMMUNITY): Payer: Medicare Other

## 2013-10-26 ENCOUNTER — Encounter (HOSPITAL_COMMUNITY): Payer: Self-pay | Admitting: General Practice

## 2013-10-26 DIAGNOSIS — R1011 Right upper quadrant pain: Secondary | ICD-10-CM

## 2013-10-26 DIAGNOSIS — J189 Pneumonia, unspecified organism: Secondary | ICD-10-CM | POA: Diagnosis present

## 2013-10-26 DIAGNOSIS — I4729 Other ventricular tachycardia: Secondary | ICD-10-CM

## 2013-10-26 DIAGNOSIS — A419 Sepsis, unspecified organism: Secondary | ICD-10-CM | POA: Diagnosis present

## 2013-10-26 DIAGNOSIS — Z95810 Presence of automatic (implantable) cardiac defibrillator: Secondary | ICD-10-CM

## 2013-10-26 DIAGNOSIS — N179 Acute kidney failure, unspecified: Secondary | ICD-10-CM

## 2013-10-26 DIAGNOSIS — R52 Pain, unspecified: Secondary | ICD-10-CM

## 2013-10-26 DIAGNOSIS — E785 Hyperlipidemia, unspecified: Secondary | ICD-10-CM | POA: Insufficient documentation

## 2013-10-26 DIAGNOSIS — K7460 Unspecified cirrhosis of liver: Secondary | ICD-10-CM | POA: Diagnosis present

## 2013-10-26 DIAGNOSIS — I4891 Unspecified atrial fibrillation: Secondary | ICD-10-CM

## 2013-10-26 DIAGNOSIS — I5022 Chronic systolic (congestive) heart failure: Secondary | ICD-10-CM

## 2013-10-26 DIAGNOSIS — I10 Essential (primary) hypertension: Secondary | ICD-10-CM

## 2013-10-26 DIAGNOSIS — I2510 Atherosclerotic heart disease of native coronary artery without angina pectoris: Secondary | ICD-10-CM

## 2013-10-26 DIAGNOSIS — K8020 Calculus of gallbladder without cholecystitis without obstruction: Secondary | ICD-10-CM

## 2013-10-26 DIAGNOSIS — I472 Ventricular tachycardia: Secondary | ICD-10-CM

## 2013-10-26 DIAGNOSIS — E860 Dehydration: Secondary | ICD-10-CM | POA: Diagnosis present

## 2013-10-26 DIAGNOSIS — K7689 Other specified diseases of liver: Secondary | ICD-10-CM

## 2013-10-26 DIAGNOSIS — K810 Acute cholecystitis: Secondary | ICD-10-CM

## 2013-10-26 DIAGNOSIS — K819 Cholecystitis, unspecified: Secondary | ICD-10-CM

## 2013-10-26 DIAGNOSIS — K8000 Calculus of gallbladder with acute cholecystitis without obstruction: Secondary | ICD-10-CM | POA: Insufficient documentation

## 2013-10-26 HISTORY — DX: Presence of automatic (implantable) cardiac defibrillator: Z95.810

## 2013-10-26 HISTORY — DX: Cholecystitis, unspecified: K81.9

## 2013-10-26 LAB — GLUCOSE, CAPILLARY
GLUCOSE-CAPILLARY: 167 mg/dL — AB (ref 70–99)
GLUCOSE-CAPILLARY: 114 mg/dL — AB (ref 70–99)
Glucose-Capillary: 134 mg/dL — ABNORMAL HIGH (ref 70–99)
Glucose-Capillary: 153 mg/dL — ABNORMAL HIGH (ref 70–99)

## 2013-10-26 LAB — PROTIME-INR
INR: 7.82 — AB (ref 0.00–1.49)
INR: 2.09 — AB (ref 0.00–1.49)
PROTHROMBIN TIME: 22.8 s — AB (ref 11.6–15.2)
Prothrombin Time: 62.5 seconds — ABNORMAL HIGH (ref 11.6–15.2)

## 2013-10-26 LAB — DIGOXIN LEVEL: Digoxin Level: 1.7 ng/mL (ref 0.8–2.0)

## 2013-10-26 LAB — HEMOGLOBIN A1C
HEMOGLOBIN A1C: 7.4 % — AB (ref <5.7)
Mean Plasma Glucose: 166 mg/dL — ABNORMAL HIGH (ref <117)

## 2013-10-26 LAB — LIPASE, BLOOD: Lipase: 226 U/L — ABNORMAL HIGH (ref 11–59)

## 2013-10-26 LAB — CBG MONITORING, ED: GLUCOSE-CAPILLARY: 164 mg/dL — AB (ref 70–99)

## 2013-10-26 MED ORDER — VANCOMYCIN HCL IN DEXTROSE 1-5 GM/200ML-% IV SOLN
1000.0000 mg | Freq: Once | INTRAVENOUS | Status: AC
  Administered 2013-10-26: 1000 mg via INTRAVENOUS
  Filled 2013-10-26: qty 200

## 2013-10-26 MED ORDER — SODIUM CHLORIDE 0.9 % IV SOLN
INTRAVENOUS | Status: AC
  Administered 2013-10-26: 100 mL/h via INTRAVENOUS

## 2013-10-26 MED ORDER — VANCOMYCIN HCL IN DEXTROSE 750-5 MG/150ML-% IV SOLN
750.0000 mg | Freq: Two times a day (BID) | INTRAVENOUS | Status: DC
  Administered 2013-10-26 – 2013-10-28 (×5): 750 mg via INTRAVENOUS
  Filled 2013-10-26 (×7): qty 150

## 2013-10-26 MED ORDER — BIOTENE DRY MOUTH MT LIQD
15.0000 mL | Freq: Two times a day (BID) | OROMUCOSAL | Status: DC
  Administered 2013-10-26 – 2013-10-31 (×10): 15 mL via OROMUCOSAL

## 2013-10-26 MED ORDER — ONDANSETRON HCL 4 MG PO TABS: 4.0000 mg | ORAL_TABLET | Freq: Four times a day (QID) | ORAL | Status: DC | PRN

## 2013-10-26 MED ORDER — ONDANSETRON HCL 4 MG/2ML IJ SOLN: 4.0000 mg | Freq: Four times a day (QID) | INTRAMUSCULAR | Status: DC | PRN

## 2013-10-26 MED ORDER — PIPERACILLIN-TAZOBACTAM 3.375 G IVPB
3.3750 g | Freq: Three times a day (TID) | INTRAVENOUS | Status: DC
  Administered 2013-10-26 – 2013-10-31 (×15): 3.375 g via INTRAVENOUS
  Filled 2013-10-26 (×20): qty 50

## 2013-10-26 MED ORDER — IOHEXOL 300 MG/ML  SOLN: 80.0000 mL | Freq: Once | INTRAMUSCULAR | Status: AC | PRN

## 2013-10-26 MED ORDER — VITAMIN K1 10 MG/ML IJ SOLN
2.5000 mg | Freq: Once | INTRAVENOUS | Status: AC
  Administered 2013-10-26: 2.5 mg via INTRAVENOUS
  Filled 2013-10-26: qty 0.25

## 2013-10-26 MED ORDER — MORPHINE SULFATE 4 MG/ML IJ SOLN
6.0000 mg | Freq: Once | INTRAMUSCULAR | Status: AC
  Administered 2013-10-26: 6 mg via INTRAVENOUS
  Filled 2013-10-26: qty 2

## 2013-10-26 MED ORDER — DEXTROSE 5 % IV SOLN
1.0000 mg | Freq: Once | INTRAVENOUS | Status: AC
  Administered 2013-10-26: 1 mg via INTRAVENOUS
  Filled 2013-10-26: qty 0.1

## 2013-10-26 MED ORDER — CHLORHEXIDINE GLUCONATE 0.12 % MT SOLN
15.0000 mL | Freq: Two times a day (BID) | OROMUCOSAL | Status: DC
  Administered 2013-10-26 – 2013-10-31 (×10): 15 mL via OROMUCOSAL
  Filled 2013-10-26 (×12): qty 15

## 2013-10-26 MED ORDER — HYDROMORPHONE HCL PF 1 MG/ML IJ SOLN
1.0000 mg | INTRAMUSCULAR | Status: DC | PRN
  Administered 2013-10-26 – 2013-10-28 (×5): 1 mg via INTRAVENOUS
  Filled 2013-10-26 (×5): qty 1

## 2013-10-26 MED ORDER — INSULIN ASPART 100 UNIT/ML ~~LOC~~ SOLN
0.0000 [IU] | SUBCUTANEOUS | Status: DC
  Administered 2013-10-26: 1 [IU] via SUBCUTANEOUS
  Administered 2013-10-26: 2 [IU] via SUBCUTANEOUS
  Administered 2013-10-27: 1 [IU] via SUBCUTANEOUS
  Administered 2013-10-27: 2 [IU] via SUBCUTANEOUS
  Administered 2013-10-27 (×2): 1 [IU] via SUBCUTANEOUS
  Administered 2013-10-28: 2 [IU] via SUBCUTANEOUS
  Administered 2013-10-28 (×2): 1 [IU] via SUBCUTANEOUS
  Administered 2013-10-28 (×2): 2 [IU] via SUBCUTANEOUS

## 2013-10-26 MED ORDER — PIPERACILLIN-TAZOBACTAM 3.375 G IVPB
3.3750 g | Freq: Once | INTRAVENOUS | Status: AC
  Administered 2013-10-26: 3.375 g via INTRAVENOUS
  Filled 2013-10-26: qty 50

## 2013-10-26 NOTE — Progress Notes (Signed)
Patient arrived to [address] and oriented to nursing unit. Explained falls protocol. Patient uses a cane at times and it is present at bedside.

## 2013-10-26 NOTE — ED Notes (Signed)
Pt back from radiology 

## 2013-10-26 NOTE — Plan of Care (Signed)
Problem: Phase I Progression Outcomes Goal: Initial discharge plan identified Outcome: Completed/Met Date Met:  10/26/13 To return home with wife

## 2013-10-26 NOTE — ED Notes (Signed)
MD at the bedside  

## 2013-10-26 NOTE — Progress Notes (Signed)
Agree with perc drain of gb and possible liver abscess, abx

## 2013-10-26 NOTE — Consult Note (Signed)
Reason for Consult:cholecystitis Referring Physician: Dr Jola Schmidt MD  Spencer Burnet Sr. is an 68 y.o. male.  HPI: 62 DAY HX OF EPIGASTRIC AND RUQ ABDOMINAL PAIN.  [place] AND DID NOT WANT TO SEEK CARE THERE. PAIN SHARP LOCATION EPIGASTRIUM SEVERE BUT BETTER THAN 10 DAYS AGO.  NO VOMITING. HAD PACEMAKER BATTERY CHANGE HERE LATE April. U/S SHOWS THICKENED GB WALL AND STONES  Past Medical History  Diagnosis Date  . Anxiety   . Complete heart block   . Ventricular tachycardia   . Cardiomyopathy   . Atrial fibrillation   . Kidney stones     "once; passed on it's own" (2013/10/17)  . Hypertension   . High cholesterol   . CHF (congestive heart failure)   . Coronary artery disease     with known occlusions of all of his native coronary  . Anterior myocardial infarction 1982; 1995  . Asthma     "when I was a boy"  . Pneumonia     "couple times" (Oct 17, 2013)  . Type II diabetes mellitus     Past Surgical History  Procedure Laterality Date  . Bi-ventricular implantable cardioverter defibrillator  (crt-d)  02/2008; 2013/10/17    MDT CRTD implanted by Dr Lovena Le for primary prevention and CHF 2009 with 6949 lead; 09/2013 - gen change and RV lead revision by Dr Lovena Le  . Cataract extraction w/ intraocular lens  implant, bilateral Bilateral ?2007  . Cardiac catheterization      "several"  . Coronary artery bypass graft  1982/1996    "CABG X5; CABG X4"     History reviewed. No pertinent family history.  Social History:  reports that he has quit smoking. His smoking use included Cigarettes. He has a 48 pack-year smoking history. He has never used smokeless tobacco. He reports that he does not drink alcohol or use illicit drugs.  Allergies:  Allergies  Allergen Reactions  . Statins Other (See Comments)    "hurt all over"  . Fenofibrate Rash  . Penicillins Rash  . Sulfonamide Derivatives Rash    Medications: I have reviewed the patient's current  medications.  Results for orders placed during the hospital encounter of 10/26/13 (from the past 48 hour(s))  CBC WITH DIFFERENTIAL     Status: Abnormal   Collection Time    10/25/13 10:24 PM      Result Value Ref Range   WBC 28.9 (*) 4.0 - 10.5 K/uL   RBC 3.81 (*) 4.22 - 5.81 MIL/uL   Hemoglobin 11.0 (*) 13.0 - 17.0 g/dL   HCT 31.9 (*) 39.0 - 52.0 %   MCV 83.7  78.0 - 100.0 fL   MCH 28.9  26.0 - 34.0 pg   MCHC 34.5  30.0 - 36.0 g/dL   RDW 15.6 (*) 11.5 - 15.5 %   Platelets 338  150 - 400 K/uL   Neutrophils Relative % 84 (*) 43 - 77 %   Lymphocytes Relative 3 (*) 12 - 46 %   Monocytes Relative 4  3 - 12 %   Eosinophils Relative 0  0 - 5 %   Basophils Relative 0  0 - 1 %   Band Neutrophils 8  0 - 10 %   Metamyelocytes Relative 1     Myelocytes 0     Promyelocytes Absolute 0     Blasts 0     nRBC 0  0 /100 WBC   Neutro Abs 26.8 (*) 1.7 -  7.7 K/uL   Lymphs Abs 0.9  0.7 - 4.0 K/uL   Monocytes Absolute 1.2 (*) 0.1 - 1.0 K/uL   Eosinophils Absolute 0.0  0.0 - 0.7 K/uL   Basophils Absolute 0.0  0.0 - 0.1 K/uL   RBC Morphology BASOPHILIC STIPPLING     Comment: POLYCHROMASIA PRESENT   WBC Morphology MILD LEFT SHIFT (1-5% METAS, OCC MYELO, OCC BANDS)     Comment: TOXIC GRANULATION  COMPREHENSIVE METABOLIC PANEL     Status: Abnormal   Collection Time    10/25/13 10:24 PM      Result Value Ref Range   Sodium 127 (*) 137 - 147 mEq/L   Potassium 4.6  3.7 - 5.3 mEq/L   Chloride 90 (*) 96 - 112 mEq/L   CO2 26  19 - 32 mEq/L   Glucose, Bld 209 (*) 70 - 99 mg/dL   BUN 67 (*) 6 - 23 mg/dL   Creatinine, Ser 1.66 (*) 0.50 - 1.35 mg/dL   Calcium 8.6  8.4 - 10.5 mg/dL   Total Protein 6.7  6.0 - 8.3 g/dL   Albumin 2.6 (*) 3.5 - 5.2 g/dL   AST 39 (*) 0 - 37 U/L   ALT 26  0 - 53 U/L   Alkaline Phosphatase 105  39 - 117 U/L   Total Bilirubin 0.5  0.3 - 1.2 mg/dL   GFR calc non Af Amer 41 (*) >90 mL/min   GFR calc Af Amer 47 (*) >90 mL/min   Comment: (NOTE)     The eGFR has been  calculated using the CKD EPI equation.     This calculation has not been validated in all clinical situations.     eGFR's persistently <90 mL/min signify possible Chronic Kidney     Disease.  LIPASE, BLOOD     Status: Abnormal   Collection Time    10/25/13 10:24 PM      Result Value Ref Range   Lipase 226 (*) 11 - 59 U/L    Dg Chest 2 View  10/26/2013   CLINICAL DATA:  Recent pneumonia  EXAM: CHEST  2 VIEW  COMPARISON:  10/09/2013  FINDINGS: Cardiac shadow is stable. A pacing device is again noted. Chronic left pleural effusion is again noted. Scarring is seen in the left lung base. Some new right basilar and density is noted which projects in the right middle lobe. No other focal abnormality is noted.  IMPRESSION: Chronic changes in the left base.  New right basilar infiltrate projecting in the right middle lobe on the lateral projection.   Electronically Signed   By: Inez Catalina M.D.   On: 10/26/2013 01:41   US Abdomen Complete  10/26/2013   CLINICAL DATA:  Abdominal pain  EXAM: ULTRASOUND ABDOMEN COMPLETE  COMPARISON:  09/16/2013  FINDINGS: Gallbladder:  Multiple gallstones are again identified. The wall is thickened 8.6 mm although no sonographic Percell Miller sign is seen.  Common bile duct:  Diameter: 6 mm  Liver:  There is a septated lesion identified within the right lobe of the liver. This was not well appreciated on the prior exam no more on a prior CT from 2008.  IVC:  No abnormality visualized.  Pancreas:  Visualized portion unremarkable.  Spleen:  Size and appearance within normal limits.  Right Kidney:  Length: 12.6 cm. Echogenicity within normal limits. No mass or hydronephrosis visualized.  Left Kidney:  Length: 12.3 cm.  2.9 cm cyst within the lower pole of left kidney.  Abdominal aorta:  No aneurysm visualized.  Other findings:  None.  IMPRESSION: Cholelithiasis with gallbladder wall thickening.  Septated lesion within the right lobe of the liver which was not well appreciated on the prior  study. This measures approximately 5 cm in greatest dimension. This may simply represent a septated cyst. Further evaluation is recommended.   Electronically Signed   By: Inez Catalina M.D.   On: 10/26/2013 02:59    Review of Systems  Constitutional: Positive for malaise/fatigue. Negative for fever and chills.  HENT: Negative.   Eyes: Negative.   Respiratory: Negative.   Cardiovascular: Negative.   Gastrointestinal: Positive for abdominal pain.  Genitourinary: Negative.   Musculoskeletal: Negative.   Skin: Negative.   Neurological: Negative.   Endo/Heme/Allergies: Bruises/bleeds easily.  Psychiatric/Behavioral: Negative.    Blood pressure 117/53, pulse 70, temperature 97.9 F (36.6 C), temperature source Oral, resp. rate 25, SpO2 98.00%. Physical Exam  Constitutional: He is oriented to person, place, and time. He appears cachectic. He has a sickly appearance.  HENT:  Head: Normocephalic.  Mouth/Throat: No oropharyngeal exudate.  Eyes: Pupils are equal, round, and reactive to light. No scleral icterus.  Neck: Normal range of motion.  Cardiovascular: A regularly irregular rhythm present.  Respiratory: Effort normal and breath sounds normal.  GI: There is tenderness in the right upper quadrant. There is positive Murphy's sign.  Musculoskeletal: Normal range of motion.  Neurological: He is alert and oriented to person, place, and time.  Skin: Skin is warm and dry.  Psychiatric: He has a normal mood and affect. His behavior is normal. Judgment and thought content normal.    Assessment/Plan: Gallstone pancreatitis Acute cholecystitis Patient Active Problem List   Diagnosis Date Noted  . Abdominal pain, acute, right upper quadrant 10/26/2013  . Acute cholecystitis 10/26/2013  . Cholecystitis 10/26/2013  . ICD (implantable cardioverter-defibrillator), biventricular, in situ 09/16/2013  . Ventricular tachycardia (paroxysmal) 09/16/2013  . Biventricular ICD (implantable cardiac  defibrillator) in place 04/16/2011  . Essential hypertension, benign 03/28/2010  . Atrial fibrillation 03/28/2010  . Chronic systolic heart failure 96/88/6484  recommend medicine admission and cardiology evaluation May need perc drain vs cholecystectomy Will follow HOLD COUMADIN Spencer Shelton 10/26/2013, 3:31 AM

## 2013-10-26 NOTE — Progress Notes (Signed)
   [facility]                                                                              Patient Demographics  Spencer Shelton, is a 68 y.o. male, DOB - 1946-03-01, PZW:[id number]  Admit date - 10/26/2013   Admitting Physician No admitting provider for patient encounter.  Outpatient Primary MD for the patient is Pcp Not In System  LOS - 0   Chief Complaint  Patient presents with  . Abdominal Pain      HPI:  68 yo male with 10days of worsening abdominal pain. He lives in [place], for past 17 years. Has seen several doctors there, was dx with pna last week and put on levaquin and cipro. His abd pain cont to worsen. Then yesterday they did a ct scan and he was called today and told he had a bad gallbladder. Pt called dr Donnie Aho whom he has seen for over 30 years, who instructed him to drive to [place] to have his surgery. He has not eaten in days. He feels awful. No n/v. Chills, subj fevers. No swelling in his legs.   Assessment & Plan  Patient admitted early this morning by Dr. Onalee Hua.  Sepsis secondary to acute cholecystitis -Treatment plan below -Patient had leukocytosis as well as tachypnea tach admission -Continue IV fluids, vancomycin, Zosyn  Abdominal pain secondary to acute cholecystitis -Abdominal ultrasound shows cholelithiasis with gallbladder wall thickening, or a lesion of the right lobe of the liver -Patient has had a CT scan that was done yesterday at [facility], [place] which showed hepatic fluid collection -General surgery consulted -Interventional radiology consulted for percutaneous drainage -Continue IV fluid, n.p.o. status, vancomycin and Zosyn for a period coverage -Cardiology also consulted for possible surgery clearance  Atrial fibrillation with supratherapeutic INR -Currently on Coumadin -Have the ICD implanted -INR 7.8, will give vitamin K to reverse. -Will check INR later this evening, patient may need further reversal -Digoxin  currently held, patient pending dig level  Chronic systolic heart failure -Last documented EF was in 2004, 40% -Dr. Donnie Aho consulted -Continue daily weights, monitoring of intake and output  Type 2 diabetes mellitus  -Actos held -Will and on insulin sliding with CBG monitoring every 4 hours -Currently patient is n.p.o.  Hypertension -Losartan, Lasix, benazepril held -Currently hypotensive -Will continue IV fluids  Code Status: Full  Family Communication: None at bedside  Disposition Plan: Admitted  Time Spent in minutes   30 minutes  Procedures  None  Consults   Surgery Cardiology Gastroenterology  DVT Prophylaxis  Supratherapeutic INR, coumadin  Kandee Escalante D.O. on 10/26/2013 at 7:33 AM  Between 7am to 7pm - Pager - [phone]  After 7pm go to [email or web address] - password [email or web address]  And look for the night coverage person covering for me after hours  [facility] Office  [phone]

## 2013-10-26 NOTE — Progress Notes (Signed)
Report received from [facility], [place] will await pt. Arrival to [phone].  Forbes Cellar, RN

## 2013-10-26 NOTE — Consult Note (Signed)
Cardiology Consult Note  Admit date: 10/26/2013 Name: Spencer GLAVE Sr. 68 y.o.  male DOB:  03-18-1946 MRN:  [id number]  Today's date:  10/26/2013  Referring Physician:    [facility]  Reason for Consultation:   Cardiac evaluation  IMPRESSIONS: 1. Probable acute cholecystitis 2. Ischemic cardiomyopathy with ejection fraction of 35% previously the echocardiogram 3. Recent defibrillator change out 4. Right middle lobe pneumonia that may have a complication of the previous defibrillator change out 5. Chronic atrial fibrillation 6. Over anticoagulation with warfarin likely due to recent antibiotics 7. 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 Acute renal failure 9. Hyperlipidemia 10. Diabetes mellitus  RECOMMENDATION: He is currently being managed for both pneumonia and will have percutaneous drainage of his gallbladder.  From a cardiovascular viewpoint he would be able to have surgery as his cardiac status has been stable. He has not had any recent decompensation of his heart failure. His risk would be somewhat increased due to his other comorbidities as well as the cirrhosis.  I think that electrophysiology needs to take a look at him while he is in the hospital  I will be away for the next 3 days and [facility] will see the patient in my absence.  Discontinue digoxin.  HISTORY: This 68 year old male has a history of coronary artery disease with previous anterior infarction previous redo bypass grafting in 1996. He had patent grafts noted at catheterization in July of 2008. His native coronary arteries were occluded. His ventricular function has shown an EF of around 35%.  His defibrillator had recently reached elective replacement and he underwent change out of this on 03-May-2024with placement of a new right ventricular lead to replace an old 6949-lead by Dr. Ladona Ridgel. He required anesthesia and testing of the device during that admission and  went home the next day. He did not feel well on Sunday and a friend who is a physician came over and thought that he might have pneumonia and placed him on antibiotics. He then the next day developed severe abdominal pain that persisted through the week and eventually had multiple tests showing possible acute cholecystitis as well as a fluid collection in his liver. He was told he might need surgery and decided to come to [facility] for further care where he presented to the emergency room this morning.  He has not had any decompensation of heart failure but is felt poorly the past couple of days and has not been able to eat and has not been able to really take much of his medications. He was markedly over anticoagulated earlier today. He currently denies angina and does not currently have edema. Prior to his defibrillator changed out he was in good condition able to do reasonable amounts of work without dyspnea or symptoms of congestive heart failure.  Past Medical History  Diagnosis Date  . Anxiety   . Complete heart block   . Ventricular tachycardia   . Cardiomyopathy   . Atrial fibrillation   . Kidney stones     "once; passed on it's own" (17-Oct-2013)  . Hypertension   . High cholesterol   . CHF (congestive heart failure)   . Coronary artery disease     with known occlusions of all of his native coronary  . Anterior myocardial infarction 1982; 1995  . Asthma     "when I was a boy"  . Pneumonia     "couple times" (10-17-2013)  . Type II diabetes mellitus   .  Cholecystitis   . Automatic implantable cardioverter-defibrillator in situ     BI VENTRICULAR      Past Surgical History  Procedure Laterality Date  . Bi-ventricular implantable cardioverter defibrillator  (crt-d)  02/2008; 10/08/2013    MDT CRTD implanted by Dr Ladona Ridgelaylor for primary prevention and CHF 2009 with 6949 lead; 09/2013 - gen change and RV lead revision by Dr Ladona Ridgelaylor  . Cataract extraction w/ intraocular lens  implant,  bilateral Bilateral ?2007  . Cardiac catheterization      "several"  . Coronary artery bypass graft  1982/1996    "CABG X5; CABG X4"      Allergies:  is allergic to statins; fenofibrate; penicillins; and sulfonamide derivatives.   Medications: Prior to Admission medications   Medication Sig Start Date End Date Taking? Authorizing Provider  ALPHAGAN P 0.1 % SOLN Place 1 drop into both eyes 2 (two) times daily.  09/09/13  Yes Historical Provider, MD  aspirin 81 MG tablet Take 81 mg by mouth daily.     Yes Historical Provider, MD  benazepril (LOTENSIN) 40 MG tablet Take 20 mg by mouth 2 (two) times daily.    Yes Historical Provider, MD  ciprofloxacin (CIPRO) 500 MG tablet Take 500 mg by mouth 2 (two) times daily.   Yes Historical Provider, MD  Coenzyme Q10 (COQ10) 100 MG CAPS Take 1 capsule by mouth daily.   Yes Historical Provider, MD  Cyanocobalamin (B-12 DOTS) 500 MCG TBDP Take 500 mg by mouth daily.    Yes Historical Provider, MD  digoxin (LANOXIN) 0.25 MG tablet Take 250 mcg by mouth daily.     Yes Historical Provider, MD  ezetimibe (ZETIA) 10 MG tablet Take 10 mg by mouth daily.     Yes Historical Provider, MD  furosemide (LASIX) 40 MG tablet Take 40 mg by mouth 2 (two) times daily.  04/16/11  Yes Marinus MawGregg W Taylor, MD  metoprolol (LOPRESSOR) 50 MG tablet Take 50 mg by mouth 2 (two) times daily.    Yes Historical Provider, MD  Multiple Vitamins-Minerals (MULTIVITAMIN WITH MINERALS) tablet Take 1 tablet by mouth daily.     Yes Historical Provider, MD  pioglitazone (ACTOS) 15 MG tablet Take 15 mg by mouth daily.   Yes Historical Provider, MD  TRAVATAN Z 0.004 % SOLN ophthalmic solution Place 1 drop into both eyes at bedtime.  09/09/13  Yes Historical Provider, MD  ursodiol (ACTIGALL) 300 MG capsule Take 300 mg by mouth 2 (two) times daily.     Yes Historical Provider, MD  valsartan (DIOVAN) 160 MG tablet Take 80 mg by mouth 2 (two) times daily.    Yes Historical Provider, MD  warfarin  (COUMADIN) 5 MG tablet Take 5 mg by mouth daily.   Yes Historical Provider, MD    Family History: Family Status  Relation Status Death Age  . Father Deceased     diabetes  . Mother Deceased     CVA  . Brother Alive   . Sister Alive   . Brother Alive     Social History:   reports that he has quit smoking. His smoking use included Cigarettes. He has a 48 pack-year smoking history. He has never used smokeless tobacco. He reports that he does not drink alcohol or use illicit drugs.   History   Social History Narrative  . No narrative on file    Review of Systems: He has significant gallop and also has recurrent sinusitis. He has not had any recent bleeding. Other than  as noted above the remainder of the review of systems is unremarkable.  Physical Exam: BP 115/63  Pulse 70  Temp(Src) 98.2 F (36.8 C) (Oral)  Resp 25  Ht 6' 3.98" (1.93 m)  Wt 84.8 kg (186 lb 15.2 oz)  BMI 22.77 kg/m2  SpO2 98% General appearance: Thin, pale-appearing white male Head: Normocephalic, without obvious abnormality, atraumatic Neck: no adenopathy, no carotid bruit, no JVD and supple, symmetrical, trachea midline Lungs: Mild rales heard over right anterior lung field previous generator is without infection or hematoma or erythema, moderate ecchymoses over left chest wall, healed median sternotomy scar Heart: regular rate and rhythm, S1, S2 normal, no murmur, click, rub or gallop Abdomen: Significant tenderness the right upper quadrant without rebound, bowel sounds present Extremities: extremities normal, atraumatic, no cyanosis or edema Pulses: 2+ and symmetric Skin: Skin color, texture, turgor normal. No rashes or lesions Neurologic: Grossly normal  Labs: CBC  Recent Labs  10/25/13 2224  WBC 28.9*  RBC 3.81*  HGB 11.0*  HCT 31.9*  PLT 338  MCV 83.7  MCH 28.9  MCHC 34.5  RDW 15.6*  LYMPHSABS 0.9  MONOABS 1.2*  EOSABS 0.0  BASOSABS 0.0   CMP   Recent Labs  10/25/13 2224   NA 127*  K 4.6  CL 90*  CO2 26  GLUCOSE 209*  BUN 67*  CREATININE 1.66*  CALCIUM 8.6  PROT 6.7  ALBUMIN 2.6*  AST 39*  ALT 26  ALKPHOS 105  BILITOT 0.5  GFRNONAA 41*  GFRAA 47*   Radiology: Cardiomegaly, new right middle lobe infiltrate  EKG: Underlying rhythm is atrial fibrillation with ventricular paced,  Signed:  W. Ashley Royalty MD [facility]   Cardiology Consultant  10/26/2013, 6:27 PM

## 2013-10-26 NOTE — Progress Notes (Addendum)
ANTIBIOTIC CONSULT NOTE - INITIAL  Pharmacy Consult for Vancocin and Zosyn Indication: rule out sepsis and intra-abdominal infection  Allergies  Allergen Reactions  . Statins Other (See Comments)    "hurt all over"  . Fenofibrate Rash  . Penicillins Rash  . Sulfonamide Derivatives Rash    Patient Measurements: Height: 6' 3.98" (193 cm) Weight: 186 lb 15.2 oz (84.8 kg) IBW/kg (Calculated) : 86.76  Vital Signs: Temp: 97.9 F (36.6 C) (05/11 2213) Temp src: Oral (05/11 2213) BP: 101/47 mmHg (05/12 0600) Pulse Rate: 66 (05/12 0600)  Labs:  Recent Labs  10/25/13 2224  WBC 28.9*  HGB 11.0*  PLT 338  CREATININE 1.66*   Estimated Creatinine Clearance: 51.1 ml/min (by C-G formula based on Cr of 1.66).   Microbiology: Recent Results (from the past 720 hour(s))  SURGICAL PCR SCREEN     Status: None   Collection Time    Oct 20, 2013  7:59 AM      Result Value Ref Range Status   MRSA, PCR NEGATIVE  NEGATIVE Final   Staphylococcus aureus NEGATIVE  NEGATIVE Final   Comment:            The Xpert SA Assay (FDA     approved for NASAL specimens     in patients over 22 years of age),     is one component of     a comprehensive surveillance     program.  Test performance has     been validated by [employer] for patients greater     than or equal to 68 year old.     It is not intended     to diagnose infection nor to     guide or monitor treatment.    Medical History: Past Medical History  Diagnosis Date  . Anxiety   . Complete heart block   . Ventricular tachycardia   . Cardiomyopathy   . Atrial fibrillation   . Kidney stones     "once; passed on it's own" (2013/10/20)  . Hypertension   . High cholesterol   . CHF (congestive heart failure)   . Coronary artery disease     with known occlusions of all of his native coronary  . Anterior myocardial infarction 1982; 1995  . Asthma     "when I was a boy"  . Pneumonia     "couple times" (10-20-2013)  . Type II  diabetes mellitus     Assessment: 68yo male was dx'd w/ PNA last week and was started on Levaquin and Cipro, CT revealed "bad gallbladder", pt [facility] to [place] for surgery, to begin IV ABX.  Goal of Therapy:  Vancomycin trough level 15-20 mcg/ml  Plan:  Pt rec'd vanc 1g and Zosyn 3.375g in ED; will continue with vancomycin 750mg  IV Q12H and Zosyn 3.375g IV Q8H and monitor CBC, Cx, levels prn.   NOTE: Pt has PCN listed as allergy but tolerated dose of Zosyn in ED; will monitor for allergic reaction.  Vernard Gambles, PharmD, BCPS  10/26/2013,7:10 AM

## 2013-10-26 NOTE — ED Notes (Signed)
Admitting MD at the bedside.  

## 2013-10-26 NOTE — ED Notes (Addendum)
Pt states that his PCP told him to come to ED for gallbladder removal. States he recently had a battery and lead changed in his pacer/defibrillator by Dr Ladona Ridgel. Pt states he is currently on antibiotics for PNA. Denies nausea/vomitting

## 2013-10-26 NOTE — Consult Note (Signed)
Referring Provider: Dr. Catha Gosselin Primary Care Physician:  Pcp Not In System Primary Gastroenterologist:  Dr. Matthias Hughs  Reason for Consultation:  Cholecystitis; Pancreatitis  HPI: Spencer Shelton. is a 68 y.o. male who has a history of gallstones and has been on Ursodiol BID for 3 years for them. He reports being diagnosed with pneumonia last week and placed on 2 antibiotics for that (Cipro and Levaquin). He reports that he developed excruciating sharp epigastric abdominal pain that became more diffuse shortly after starting the antibiotics. He lives at [facility] now and had a CT scan there that reportedly showed cholecystitis. He did not want to have surgery there so came back to [facility] to be treated. +N without vomiting. BMs regular. Denies dizziness. Denies previous abdominal pain like he has been experiencing over the past 2 weeks. No appetite. Question of cirrhosis on U/S in March and CT in 2008. Has been a long-term patient of Dr. Matthias Hughs and Dr. Donnie Aho. U/S showed multiple gallstones and a thickened gallbladder wall. No CBD dilation. On chronic Coumadin and INR 7.8 on admit.  Results for Spencer, Shelton (MRN [id number]) as of 10/26/2013 14:43  Ref. Range 10/25/2013 22:24  Alkaline Phosphatase Latest Range: 39-117 U/L 105  Albumin Latest Range: 3.5-5.2 g/dL 2.6 (L)  Lipase Latest Range: 11-59 U/L 226 (H)  AST Latest Range: 0-37 U/L 39 (H)  ALT Latest Range: 0-53 U/L 26  Total Protein Latest Range: 6.0-8.3 g/dL 6.7  Total Bilirubin Latest Range: 0.3-1.2 mg/dL 0.5      Past Medical History  Diagnosis Date  . Anxiety   . Complete heart block   . Ventricular tachycardia   . Cardiomyopathy   . Atrial fibrillation   . Kidney stones     "once; passed on it's own" (11-01-13)  . Hypertension   . High cholesterol   . CHF (congestive heart failure)   . Coronary artery disease     with known occlusions of all of his native coronary  . Anterior myocardial infarction 1982; 1995  .  Asthma     "when I was a boy"  . Pneumonia     "couple times" (11-01-13)  . Type II diabetes mellitus     Past Surgical History  Procedure Laterality Date  . Bi-ventricular implantable cardioverter defibrillator  (crt-d)  02/2008; 01-Nov-2013    MDT CRTD implanted by Dr Ladona Ridgel for primary prevention and CHF 2009 with 6949 lead; 09/2013 - gen change and RV lead revision by Dr Ladona Ridgel  . Cataract extraction w/ intraocular lens  implant, bilateral Bilateral ?2007  . Cardiac catheterization      "several"  . Coronary artery bypass graft  1982/1996    "CABG X5; CABG X4"     Prior to Admission medications   Medication Sig Start Date End Date Taking? Authorizing Provider  ALPHAGAN P 0.1 % SOLN Place 1 drop into both eyes 2 (two) times daily.  09/09/13  Yes Historical Provider, MD  aspirin 81 MG tablet Take 81 mg by mouth daily.     Yes Historical Provider, MD  benazepril (LOTENSIN) 40 MG tablet Take 20 mg by mouth 2 (two) times daily.    Yes Historical Provider, MD  ciprofloxacin (CIPRO) 500 MG tablet Take 500 mg by mouth 2 (two) times daily.   Yes Historical Provider, MD  Coenzyme Q10 (COQ10) 100 MG CAPS Take 1 capsule by mouth daily.   Yes Historical Provider, MD  Cyanocobalamin (B-12 DOTS) 500 MCG TBDP Take 500 mg by  mouth daily.    Yes Historical Provider, MD  digoxin (LANOXIN) 0.25 MG tablet Take 250 mcg by mouth daily.     Yes Historical Provider, MD  ezetimibe (ZETIA) 10 MG tablet Take 10 mg by mouth daily.     Yes Historical Provider, MD  furosemide (LASIX) 40 MG tablet Take 40 mg by mouth 2 (two) times daily.  04/16/11  Yes Marinus Maw, MD  metoprolol (LOPRESSOR) 50 MG tablet Take 50 mg by mouth 2 (two) times daily.    Yes Historical Provider, MD  Multiple Vitamins-Minerals (MULTIVITAMIN WITH MINERALS) tablet Take 1 tablet by mouth daily.     Yes Historical Provider, MD  pioglitazone (ACTOS) 15 MG tablet Take 15 mg by mouth daily.   Yes Historical Provider, MD  TRAVATAN Z 0.004 % SOLN  ophthalmic solution Place 1 drop into both eyes at bedtime.  09/09/13  Yes Historical Provider, MD  ursodiol (ACTIGALL) 300 MG capsule Take 300 mg by mouth 2 (two) times daily.     Yes Historical Provider, MD  valsartan (DIOVAN) 160 MG tablet Take 80 mg by mouth 2 (two) times daily.    Yes Historical Provider, MD  warfarin (COUMADIN) 5 MG tablet Take 5 mg by mouth daily.   Yes Historical Provider, MD    Scheduled Meds: . antiseptic oral rinse  15 mL Mouth Rinse q12n4p  . chlorhexidine  15 mL Mouth Rinse BID  . insulin aspart  0-9 Units Subcutaneous 6 times per day  . piperacillin-tazobactam (ZOSYN)  IV  3.375 g Intravenous Q8H  . vancomycin  750 mg Intravenous Q12H   Continuous Infusions: . sodium chloride 100 mL/hr (10/26/13 0643)   PRN Meds:.HYDROmorphone (DILAUDID) injection, iohexol, ondansetron (ZOFRAN) IV, ondansetron  Allergies as of 10/25/2013 - Review Complete 10/25/2013  Allergen Reaction Noted  . Fenofibrate    . Penicillins    . Statins    . Sulfonamide derivatives      History reviewed. No pertinent family history.  History   Social History  . Marital Status: Married    Spouse Name: N/A    Number of Children: N/A  . Years of Education: N/A   Occupational History  . Retired    Social History Main Topics  . Smoking status: Former Smoker -- 4.00 packs/day for 12 years    Types: Cigarettes  . Smokeless tobacco: Never Used     Comment: "quit smoking cigarettes in 1975  . Alcohol Use: No  . Drug Use: No  . Sexual Activity: No   Other Topics Concern  . Not on file   Social History Narrative  . No narrative on file    Review of Systems: All negative from GI standpoint except as stated above in HPI.  Physical Exam: Vital signs: Filed Vitals:   10/26/13 1251  BP: 94/43  Pulse: 70  Temp: 98.2 F (36.8 C)  Resp: 25   Last BM Date: 10/25/13 General:   Well-developed, well-nourished, pleasant and cooperative in NAD HEENT: anicteric Lungs:  Clear  throughout to auscultation.   No wheezes, crackles, or rhonchi. No acute distress. Heart:  Regular rate and rhythm; no murmurs, clicks, rubs,  or gallops. Abdomen: diffuse tenderness (greatest in epigastric); mainly in upper quadrants, soft, mildly distended, +BS  Rectal:  Deferred Ext: no edema Neuro: alert, oriented  GI:  Lab Results:  Recent Labs  10/25/13 2224  WBC 28.9*  HGB 11.0*  HCT 31.9*  PLT 338   BMET  Recent Labs  10/25/13 2224  NA 127*  K 4.6  CL 90*  CO2 26  GLUCOSE 209*  BUN 67*  CREATININE 1.66*  CALCIUM 8.6   LFT  Recent Labs  10/25/13 2224  PROT 6.7  ALBUMIN 2.6*  AST 39*  ALT 26  ALKPHOS 105  BILITOT 0.5   PT/INR  Recent Labs  10/26/13 0347  LABPROT 62.5*  INR 7.82*     Studies/Results: Dg Chest 2 View  10/26/2013   CLINICAL DATA:  Recent pneumonia  EXAM: CHEST  2 VIEW  COMPARISON:  10/09/2013  FINDINGS: Cardiac shadow is stable. A pacing device is again noted. Chronic left pleural effusion is again noted. Scarring is seen in the left lung base. Some new right basilar and density is noted which projects in the right middle lobe. No other focal abnormality is noted.  IMPRESSION: Chronic changes in the left base.  New right basilar infiltrate projecting in the right middle lobe on the lateral projection.   Electronically Signed   By: Alcide CleverMark  Lukens M.D.   On: 10/26/2013 01:41   [place] Abdomen Complete  10/26/2013   CLINICAL DATA:  Abdominal pain  EXAM: ULTRASOUND ABDOMEN COMPLETE  COMPARISON:  09/16/2013  FINDINGS: Gallbladder:  Multiple gallstones are again identified. The wall is thickened 8.6 mm although no sonographic Eulah PontMurphy sign is seen.  Common bile duct:  Diameter: 6 mm  Liver:  There is a septated lesion identified within the right lobe of the liver. This was not well appreciated on the prior exam no more on a prior CT from 2008.  IVC:  No abnormality visualized.  Pancreas:  Visualized portion unremarkable.  Spleen:  Size and appearance  within normal limits.  Right Kidney:  Length: 12.6 cm. Echogenicity within normal limits. No mass or hydronephrosis visualized.  Left Kidney:  Length: 12.3 cm.  2.9 cm cyst within the lower pole of left kidney.  Abdominal aorta:  No aneurysm visualized.  Other findings:  None.  IMPRESSION: Cholelithiasis with gallbladder wall thickening.  Septated lesion within the right lobe of the liver which was not well appreciated on the prior study. This measures approximately 5 cm in greatest dimension. This may simply represent a septated cyst. Further evaluation is recommended.   Electronically Signed   By: Alcide CleverMark  Lukens M.D.   On: 10/26/2013 02:59    Impression/Plan: 68 yo with acute cholecystitis and gallstone pancreatitis who has significant cardiac comorbidities and has a supratherapeutic INR of 7.82. Question of cirrhosis on U/S in March and CT in 2008. Hepatic fluid collection on imaging, which IR will hopefully aspirate. Surgery recommended percutaneous cholecystostomy tube to be done by IR. On broad spectrum Abx. No ERCP needed at this time. Strict NPO. IVFs. Supportive care. Agree with surgery recs. Will follow.     LOS: 0 days   Shirley FriarVincent C. Nyriah Coote  10/26/2013, 2:32 PM

## 2013-10-26 NOTE — Progress Notes (Signed)
Subjective: The patient has been treated for "pneumonia" for the last 10 days.  He received some unknown doses of cipro and levoflox.    Objective: Vital signs in last 24 hours: Temp:  [97.9 F (36.6 C)] 97.9 F (36.6 C) (05/11 2213) Pulse Rate:  [64-78] 71 (05/12 1100) Resp:  [12-29] 29 (05/12 1100) BP: (90-118)/(40-56) 106/47 mmHg (05/12 1100) SpO2:  [95 %-100 %] 96 % (05/12 1100) Weight:  [186 lb 15.2 oz (84.8 kg)] 186 lb 15.2 oz (84.8 kg) (05/12 0600)    Intake/Output from previous day:   Intake/Output this shift:    PE: Gen:  Alert, NAD, pleasant Abd: Soft, mild distension, quite tender in the RUQ, +BS, no HSM   Lab Results:   Recent Labs  10/25/13 2224  WBC 28.9*  HGB 11.0*  HCT 31.9*  PLT 338   BMET  Recent Labs  10/25/13 2224  NA 127*  K 4.6  CL 90*  CO2 26  GLUCOSE 209*  BUN 67*  CREATININE 1.66*  CALCIUM 8.6   PT/INR  Recent Labs  10/26/13 0347  LABPROT 62.5*  INR 7.82*   CMP     Component Value Date/Time   NA 127* 10/25/2013 2224   K 4.6 10/25/2013 2224   CL 90* 10/25/2013 2224   CO2 26 10/25/2013 2224   GLUCOSE 209* 10/25/2013 2224   BUN 67* 10/25/2013 2224   CREATININE 1.66* 10/25/2013 2224   CALCIUM 8.6 10/25/2013 2224   PROT 6.7 10/25/2013 2224   ALBUMIN 2.6* 10/25/2013 2224   AST 39* 10/25/2013 2224   ALT 26 10/25/2013 2224   ALKPHOS 105 10/25/2013 2224   BILITOT 0.5 10/25/2013 2224   GFRNONAA 41* 10/25/2013 2224   GFRAA 47* 10/25/2013 2224   Lipase     Component Value Date/Time   LIPASE 226* 10/25/2013 2224       Studies/Results: Dg Chest 2 View  10/26/2013   CLINICAL DATA:  Recent pneumonia  EXAM: CHEST  2 VIEW  COMPARISON:  10/09/2013  FINDINGS: Cardiac shadow is stable. A pacing device is again noted. Chronic left pleural effusion is again noted. Scarring is seen in the left lung base. Some new right basilar and density is noted which projects in the right middle lobe. No other focal abnormality is noted.  IMPRESSION:  Chronic changes in the left base.  New right basilar infiltrate projecting in the right middle lobe on the lateral projection.   Electronically Signed   By: Alcide CleverMark  Lukens M.D.   On: 10/26/2013 01:41   [place] Abdomen Complete  10/26/2013   CLINICAL DATA:  Abdominal pain  EXAM: ULTRASOUND ABDOMEN COMPLETE  COMPARISON:  09/16/2013  FINDINGS: Gallbladder:  Multiple gallstones are again identified. The wall is thickened 8.6 mm although no sonographic Eulah PontMurphy sign is seen.  Common bile duct:  Diameter: 6 mm  Liver:  There is a septated lesion identified within the right lobe of the liver. This was not well appreciated on the prior exam no more on a prior CT from 2008.  IVC:  No abnormality visualized.  Pancreas:  Visualized portion unremarkable.  Spleen:  Size and appearance within normal limits.  Right Kidney:  Length: 12.6 cm. Echogenicity within normal limits. No mass or hydronephrosis visualized.  Left Kidney:  Length: 12.3 cm.  2.9 cm cyst within the lower pole of left kidney.  Abdominal aorta:  No aneurysm visualized.  Other findings:  None.  IMPRESSION: Cholelithiasis with gallbladder wall thickening.  Septated lesion within the right lobe  of the liver which was not well appreciated on the prior study. This measures approximately 5 cm in greatest dimension. This may simply represent a septated cyst. Further evaluation is recommended.   Electronically Signed   By: Alcide Clever M.D.   On: 10/26/2013 02:59    Anti-infectives: Anti-infectives   Start     Dose/Rate Route Frequency Ordered Stop   10/26/13 2000  vancomycin (VANCOCIN) IVPB 750 mg/150 ml premix     750 mg 150 mL/hr over 60 Minutes Intravenous Every 12 hours 10/26/13 0709     10/26/13 0715  vancomycin (VANCOCIN) IVPB 1000 mg/200 mL premix     1,000 mg 200 mL/hr over 60 Minutes Intravenous  Once 10/26/13 0709 10/26/13 1117   10/26/13 0715  piperacillin-tazobactam (ZOSYN) IVPB 3.375 g     3.375 g 12.5 mL/hr over 240 Minutes Intravenous Every 8 hours  10/26/13 0709     10/26/13 0315  piperacillin-tazobactam (ZOSYN) IVPB 3.375 g     3.375 g 12.5 mL/hr over 240 Minutes Intravenous  Once 10/26/13 0303 10/26/13 0837       Assessment/Plan Subacute Cholecystitis ? Perforated Perihepatic fluid collection Leukocytosis - 28.9   Plan: 1.  Discussed with Dr. Dwain Sarna, would recommend perc drainage as opposed to cholecystectomy at this time, maybe tomorrow once INR is corrected. Discussed this with Dr. Catha Gosselin who is agreeable.  Placed consult to IR.  They may also be able to aspirate vs drain the hepatic fluid collection which may be an abscess.   2.  Talked with IR.  The patient had a CT scan done yesterday in [facility].  These images were reviewed with Dr. Lowella Dandy who would like to attempt perc drain of the gallbladder and at least aspiration of the hepatic fluid colelction 3.  On Vanc/Zosyn 4.  Okay to start DVT proph and should hold after midnight 5.  Continue to hold coumadin and and continue reversal.       LOS: 0 days    Aris Georgia 10/26/2013, 11:30 AM Pager: [phone]

## 2013-10-26 NOTE — ED Notes (Signed)
CBG 164. Patient being taken upstairs.

## 2013-10-26 NOTE — ED Notes (Signed)
Pt taken to xray 

## 2013-10-26 NOTE — ED Notes (Signed)
Patient remains in room. CT Order cancelled.

## 2013-10-26 NOTE — ED Notes (Signed)
Informed Dr Patria Mane of Critical lab values of PT 62.5 INR 7.82

## 2013-10-26 NOTE — H&P (Signed)
Chief Complaint:  abd pain  HPI: 68 yo male with 10days of worsening abdominal pain.  He lives in [place], for past 17 years.  Has seen several doctors there, was dx with pna last week and put on levaquin and cipro.  His abd pain cont to worsen.  Then yesterday they did a ct scan and he was called today and told he had a bad gallbladder.  Pt called dr Donnie Aho whom he has seen for over 30 years, who instructed him to drive to [place] to have his surgery.  He has not eaten in days.  He feels awful.  No n/v.  Chills, subj fevers.  No swelling in his legs.  Review of Systems:  Positive and negative as per HPI otherwise all other systems are negative  Past Medical History: Past Medical History  Diagnosis Date  . Anxiety   . Complete heart block   . Ventricular tachycardia   . Cardiomyopathy   . Atrial fibrillation   . Kidney stones     "once; passed on it's own" (Oct 18, 2013)  . Hypertension   . High cholesterol   . CHF (congestive heart failure)   . Coronary artery disease     with known occlusions of all of his native coronary  . Anterior myocardial infarction 1982; 1995  . Asthma     "when I was a boy"  . Pneumonia     "couple times" (2013/10/18)  . Type II diabetes mellitus    Past Surgical History  Procedure Laterality Date  . Bi-ventricular implantable cardioverter defibrillator  (crt-d)  02/2008; 10/18/13    MDT CRTD implanted by Dr Ladona Ridgel for primary prevention and CHF 2009 with 6949 lead; 09/2013 - gen change and RV lead revision by Dr Ladona Ridgel  . Cataract extraction w/ intraocular lens  implant, bilateral Bilateral ?2007  . Cardiac catheterization      "several"  . Coronary artery bypass graft  1982/1996    "CABG X5; CABG X4"     Medications: Prior to Admission medications   Medication Sig Start Date End Date Taking? Authorizing Provider  ALPHAGAN P 0.1 % SOLN Place 1 drop into both eyes 2 (two) times daily.  09/09/13  Yes Historical Provider, MD  aspirin 81 MG  tablet Take 81 mg by mouth daily.     Yes Historical Provider, MD  benazepril (LOTENSIN) 40 MG tablet Take 20 mg by mouth 2 (two) times daily.    Yes Historical Provider, MD  ciprofloxacin (CIPRO) 500 MG tablet Take 500 mg by mouth 2 (two) times daily.   Yes Historical Provider, MD  Coenzyme Q10 (COQ10) 100 MG CAPS Take 1 capsule by mouth daily.   Yes Historical Provider, MD  Cyanocobalamin (B-12 DOTS) 500 MCG TBDP Take 500 mg by mouth daily.    Yes Historical Provider, MD  digoxin (LANOXIN) 0.25 MG tablet Take 250 mcg by mouth daily.     Yes Historical Provider, MD  ezetimibe (ZETIA) 10 MG tablet Take 10 mg by mouth daily.     Yes Historical Provider, MD  furosemide (LASIX) 40 MG tablet Take 40 mg by mouth 2 (two) times daily.  04/16/11  Yes Marinus Maw, MD  metoprolol (LOPRESSOR) 50 MG tablet Take 50 mg by mouth 2 (two) times daily.    Yes Historical Provider, MD  Multiple Vitamins-Minerals (MULTIVITAMIN WITH MINERALS) tablet Take 1 tablet by mouth daily.     Yes Historical Provider, MD  pioglitazone (ACTOS) 15 MG tablet Take 15 mg  by mouth daily.   Yes Historical Provider, MD  TRAVATAN Z 0.004 % SOLN ophthalmic solution Place 1 drop into both eyes at bedtime.  09/09/13  Yes Historical Provider, MD  ursodiol (ACTIGALL) 300 MG capsule Take 300 mg by mouth 2 (two) times daily.     Yes Historical Provider, MD  valsartan (DIOVAN) 160 MG tablet Take 80 mg by mouth 2 (two) times daily.    Yes Historical Provider, MD  warfarin (COUMADIN) 5 MG tablet Take 5 mg by mouth daily.   Yes Historical Provider, MD    Allergies:   Allergies  Allergen Reactions  . Statins Other (See Comments)    "hurt all over"  . Fenofibrate Rash  . Penicillins Rash  . Sulfonamide Derivatives Rash    Social History:  reports that he has quit smoking. His smoking use included Cigarettes. He has a 48 pack-year smoking history. He has never used smokeless tobacco. He reports that he does not drink alcohol or use illicit  drugs.  Family History: History reviewed. No pertinent family history.  Physical Exam: Filed Vitals:   10/25/13 2213 10/26/13 0057 10/26/13 0100  BP: 99/56 118/49 117/53  Pulse: 78 71 70  Temp: 97.9 F (36.6 C)    TempSrc: Oral    Resp: 16 12 25   SpO2: 100% 98% 98%   General appearance: alert, cooperative and no distress Head: Normocephalic, without obvious abnormality, atraumatic Eyes: negative Nose: Nares normal. Septum midline. Mucosa normal. No drainage or sinus tenderness. Neck: no JVD and supple, symmetrical, trachea midline Lungs: clear to auscultation bilaterally Heart: regular rate and rhythm, S1, S2 normal, no murmur, click, rub or gallop Abdomen: soft ttp diffuse more in ruq nd no r/g dec bs Extremities: extremities normal, atraumatic, no cyanosis or edema Pulses: 2+ and symmetric Skin: Skin color, texture, turgor normal. No rashes or lesions Neurologic: Grossly normal    Labs on Admission:   Recent Labs  10/25/13 2224  NA 127*  K 4.6  CL 90*  CO2 26  GLUCOSE 209*  BUN 67*  CREATININE 1.66*  CALCIUM 8.6    Recent Labs  10/25/13 2224  AST 39*  ALT 26  ALKPHOS 105  BILITOT 0.5  PROT 6.7  ALBUMIN 2.6*    Recent Labs  10/25/13 2224  LIPASE 226*    Recent Labs  10/25/13 2224  WBC 28.9*  NEUTROABS 26.8*  HGB 11.0*  HCT 31.9*  MCV 83.7  PLT 338    Radiological Exams on Admission: Dg Chest 2 View  10/26/2013   CLINICAL DATA:  Recent pneumonia  EXAM: CHEST  2 VIEW  COMPARISON:  10/09/2013  FINDINGS: Cardiac shadow is stable. A pacing device is again noted. Chronic left pleural effusion is again noted. Scarring is seen in the left lung base. Some new right basilar and density is noted which projects in the right middle lobe. No other focal abnormality is noted.  IMPRESSION: Chronic changes in the left base.  New right basilar infiltrate projecting in the right middle lobe on the lateral projection.   Electronically Signed   By: Alcide CleverMark  Lukens  M.D.   On: 10/26/2013 01:41   [place] Abdomen Complete  10/26/2013   CLINICAL DATA:  Abdominal pain  EXAM: ULTRASOUND ABDOMEN COMPLETE  COMPARISON:  09/16/2013  FINDINGS: Gallbladder:  Multiple gallstones are again identified. The wall is thickened 8.6 mm although no sonographic Eulah PontMurphy sign is seen.  Common bile duct:  Diameter: 6 mm  Liver:  There is a septated lesion  identified within the right lobe of the liver. This was not well appreciated on the prior exam no more on a prior CT from 2008.  IVC:  No abnormality visualized.  Pancreas:  Visualized portion unremarkable.  Spleen:  Size and appearance within normal limits.  Right Kidney:  Length: 12.6 cm. Echogenicity within normal limits. No mass or hydronephrosis visualized.  Left Kidney:  Length: 12.3 cm.  2.9 cm cyst within the lower pole of left kidney.  Abdominal aorta:  No aneurysm visualized.  Other findings:  None.  IMPRESSION: Cholelithiasis with gallbladder wall thickening.  Septated lesion within the right lobe of the liver which was not well appreciated on the prior study. This measures approximately 5 cm in greatest dimension. This may simply represent a septated cyst. Further evaluation is recommended.   Electronically Signed   By: Alcide Clever M.D.   On: 10/26/2013 02:59    Assessment/Plan  68 yo male with acute cholecystitis and pna  Principal Problem:   Acute cholecystitis-  General surgery seeing also.  Keep npo.  Dehydrated, will give another liter of ivf.  Cover with zosyn and vancomycin.  Active Problems:  Stable unless o/ wnoted    Essential hypertension, benign   Atrial fibrillation   Chronic systolic heart failure   ICD (implantable cardioverter-defibrillator), biventricular, in situ   Ventricular tachycardia (paroxysmal)   Abdominal pain, acute, right upper quadrant   Cholecystitis   Dehydration   Acute renal failure   PNA (pneumonia)  Will need to call dr Donnie Aho in am.  Last echo here in 2004 EF was 40%, im sure dr  Donnie Aho has more recent studies in his office.  Dig level and coags pending along with preop ekg.   Yazmyne Sara A Onalee Hua 10/26/2013, 4:02 AM

## 2013-10-26 NOTE — Progress Notes (Signed)
Spencer Shelton [id number] Admitted to 5W 14: 10/26/2013 2:22 PM Attending Provider: Edsel Petrin, DO    Spencer Shelton. is a 68 y.o. male patient admitted from ED awake, alert  & orientated  X 3,  Full Code, VSS - Blood pressure 94/43, pulse 70, temperature 98.2 F (36.8 C), temperature source Oral, resp. rate 25, height 6' 3.98" (1.93 m), weight 84.8 kg (186 lb 15.2 oz), SpO2 98.00%., R/A,  no c/o shortness of breath, no c/o chest pain, no distress noted. Tele placed and pt is currently running:normal sinus rhythm.   IV site WDL:  forearm right, condition patent and no redness with a transparent dsg that's clean dry and intact.  Allergies:   Allergies  Allergen Reactions  . Statins Other (See Comments)    "hurt all over"  . Fenofibrate Rash  . Penicillins Rash  . Sulfonamide Derivatives Rash     Past Medical History  Diagnosis Date  . Anxiety   . Complete heart block   . Ventricular tachycardia   . Cardiomyopathy   . Atrial fibrillation   . Kidney stones     "once; passed on it's own" (2013/10/13)  . Hypertension   . High cholesterol   . CHF (congestive heart failure)   . Coronary artery disease     with known occlusions of all of his native coronary  . Anterior myocardial infarction 1982; 1995  . Asthma     "when I was a boy"  . Pneumonia     "couple times" (Oct 13, 2013)  . Type II diabetes mellitus     History:   Admission RN to obtained from the patient.  Pt orientation to unit, room and routine. Information packet given to patient.  Admission INP armband ID verified with patient/family, and in place. SR up x 2, fall risk assessment complete with Patient and family verbalizing understanding of risks associated with falls. Pt verbalizes an understanding of how to use the call bell and to call for help before getting out of bed.  Skin, clean-dry- intact with bruising to bilat. Arms, left upper chest and left side.   No evidence of skin break down noted on  exam.    Will cont to monitor and assist as needed.  Spencer Shelton Maryln Manuel, RN 10/26/2013 2:22 PM

## 2013-10-26 NOTE — Progress Notes (Signed)
Patient ID: Spencer Doony R Stelzer Sr., male   DOB: 10/17/1945, 68 y.o.   MRN: [id number] Request received for percutaneous cholecystostomy tube placement and aspiration of perihepatic fluid in pt admitted with abdominal pain, leukocytosis, gallstones and imaging findings c/w cholecystitis/pancreatitis and assoc perihepatic fluid. [facility] were reviewed by Dr. Lowella DandyHenn. Pt also diagnosed with PNA approx 10 days ago and tx'd with cipro/levoflox. He has been on coumadin for afib with PT/INR today of 62.5/7.82. Additional PMH as below. Exam: pt awake/alert; chest- dim BS left base, few rt basilar crackles; heart- RRR; ICD left chest wall; abd- soft,few BS, mod tender epigastric/RUQ regions with some radiation to rt flank; ext- FROM, no edema.     Filed Vitals:   10/26/13 1100 10/26/13 1130 10/26/13 1200 10/26/13 1251  BP: 106/47 100/45 94/43 94/43   Pulse: 71 70 69 70  Temp:    98.2 F (36.8 C)  TempSrc:    Oral  Resp: 29 21 13 25   Height:      Weight:      SpO2: 96% 96% 96% 98%   Past Medical History  Diagnosis Date  . Anxiety   . Complete heart block   . Ventricular tachycardia   . Cardiomyopathy   . Atrial fibrillation   . Kidney stones     "once; passed on it's own" (10/08/2013)  . Hypertension   . High cholesterol   . CHF (congestive heart failure)   . Coronary artery disease     with known occlusions of all of his native coronary  . Anterior myocardial infarction 1982; 1995  . Asthma     "when I was a boy"  . Pneumonia     "couple times" (10/08/2013)  . Type II diabetes mellitus    Past Surgical History  Procedure Laterality Date  . Bi-ventricular implantable cardioverter defibrillator  (crt-d)  02/2008; 10/08/2013    MDT CRTD implanted by Dr Ladona Ridgelaylor for primary prevention and CHF 2009 with 6949 lead; 09/2013 - gen change and RV lead revision by Dr Ladona Ridgelaylor  . Cataract extraction w/ intraocular lens  implant, bilateral Bilateral ?2007  . Cardiac catheterization       "several"  . Coronary artery bypass graft  1982/1996    "CABG X5; CABG X4"    Dg Chest 2 View  10/26/2013   CLINICAL DATA:  Recent pneumonia  EXAM: CHEST  2 VIEW  COMPARISON:  10/09/2013  FINDINGS: Cardiac shadow is stable. A pacing device is again noted. Chronic left pleural effusion is again noted. Scarring is seen in the left lung base. Some new right basilar and density is noted which projects in the right middle lobe. No other focal abnormality is noted.  IMPRESSION: Chronic changes in the left base.  New right basilar infiltrate projecting in the right middle lobe on the lateral projection.   Electronically Signed   By: Alcide CleverMark  Lukens M.D.   On: 10/26/2013 01:41   Dg Chest 2 View  10/09/2013   CLINICAL DATA:  Pacemaker revision  EXAM: CHEST  2 VIEW  COMPARISON:  Prior chest x-ray 09/02/2006  FINDINGS: Interval revision of the cardiac rhythm maintenance device generator in the left chest. Stable position of left ventricular, atrial and right ventricular leads with introduction of a second right ventricular lead. Patient is status post median sternotomy. Interval fracture of the superior most sternal wire. Stable mild cardiomegaly. Atherosclerotic calcifications noted in the transverse aorta. No pneumothorax. Persistence left pleural effusion and associated left  basilar atelectasis dating back to 2008. The right lung remains clear. No acute osseous abnormality.  IMPRESSION: 1. Revision of left subclavian approach biventricular cardiac rhythm maintenance device without evidence of complication. 2. Stable chronic left lower lobe pleural effusion Ganz chronic atelectasis versus pleural parenchymal scarring dating back to 2008. 3. Incidental note is made of a interval fracture of the superior most sternotomy wire. a   Electronically Signed   By: Malachy Moan M.D.   On: 10/09/2013 08:30   US Abdomen Complete  10/26/2013   CLINICAL DATA:  Abdominal pain  EXAM: ULTRASOUND ABDOMEN COMPLETE  COMPARISON:   09/16/2013  FINDINGS: Gallbladder:  Multiple gallstones are again identified. The wall is thickened 8.6 mm although no sonographic Eulah Pont sign is seen.  Common bile duct:  Diameter: 6 mm  Liver:  There is a septated lesion identified within the right lobe of the liver. This was not well appreciated on the prior exam no more on a prior CT from 2008.  IVC:  No abnormality visualized.  Pancreas:  Visualized portion unremarkable.  Spleen:  Size and appearance within normal limits.  Right Kidney:  Length: 12.6 cm. Echogenicity within normal limits. No mass or hydronephrosis visualized.  Left Kidney:  Length: 12.3 cm.  2.9 cm cyst within the lower pole of left kidney.  Abdominal aorta:  No aneurysm visualized.  Other findings:  None.  IMPRESSION: Cholelithiasis with gallbladder wall thickening.  Septated lesion within the right lobe of the liver which was not well appreciated on the prior study. This measures approximately 5 cm in greatest dimension. This may simply represent a septated cyst. Further evaluation is recommended.   Electronically Signed   By: Alcide Clever M.D.   On: 10/26/2013 02:59  Results for orders placed during the hospital encounter of 10/26/13  CBC WITH DIFFERENTIAL      Result Value Ref Range   WBC 28.9 (*) 4.0 - 10.5 K/uL   RBC 3.81 (*) 4.22 - 5.81 MIL/uL   Hemoglobin 11.0 (*) 13.0 - 17.0 g/dL   HCT 96.0 (*) 45.4 - 09.8 %   MCV 83.7  78.0 - 100.0 fL   MCH 28.9  26.0 - 34.0 pg   MCHC 34.5  30.0 - 36.0 g/dL   RDW 11.9 (*) 14.7 - 82.9 %   Platelets 338  150 - 400 K/uL   Neutrophils Relative % 84 (*) 43 - 77 %   Lymphocytes Relative 3 (*) 12 - 46 %   Monocytes Relative 4  3 - 12 %   Eosinophils Relative 0  0 - 5 %   Basophils Relative 0  0 - 1 %   Band Neutrophils 8  0 - 10 %   Metamyelocytes Relative 1     Myelocytes 0     Promyelocytes Absolute 0     Blasts 0     nRBC 0  0 /100 WBC   Neutro Abs 26.8 (*) 1.7 - 7.7 K/uL   Lymphs Abs 0.9  0.7 - 4.0 K/uL   Monocytes Absolute 1.2  (*) 0.1 - 1.0 K/uL   Eosinophils Absolute 0.0  0.0 - 0.7 K/uL   Basophils Absolute 0.0  0.0 - 0.1 K/uL   RBC Morphology BASOPHILIC STIPPLING     WBC Morphology MILD LEFT SHIFT (1-5% METAS, OCC MYELO, OCC BANDS)    COMPREHENSIVE METABOLIC PANEL      Result Value Ref Range   Sodium 127 (*) 137 - 147 mEq/L   Potassium 4.6  3.7 -  5.3 mEq/L   Chloride 90 (*) 96 - 112 mEq/L   CO2 26  19 - 32 mEq/L   Glucose, Bld 209 (*) 70 - 99 mg/dL   BUN 67 (*) 6 - 23 mg/dL   Creatinine, Ser 1.61 (*) 0.50 - 1.35 mg/dL   Calcium 8.6  8.4 - 09.6 mg/dL   Total Protein 6.7  6.0 - 8.3 g/dL   Albumin 2.6 (*) 3.5 - 5.2 g/dL   AST 39 (*) 0 - 37 U/L   ALT 26  0 - 53 U/L   Alkaline Phosphatase 105  39 - 117 U/L   Total Bilirubin 0.5  0.3 - 1.2 mg/dL   GFR calc non Af Amer 41 (*) >90 mL/min   GFR calc Af Amer 47 (*) >90 mL/min  LIPASE, BLOOD      Result Value Ref Range   Lipase 226 (*) 11 - 59 U/L  PROTIME-INR      Result Value Ref Range   Prothrombin Time 62.5 (*) 11.6 - 15.2 seconds   INR 7.82 (*) 0.00 - 1.49  DIGOXIN LEVEL      Result Value Ref Range   Digoxin Level 1.7  0.8 - 2.0 ng/mL  GLUCOSE, CAPILLARY      Result Value Ref Range   Glucose-Capillary 167 (*) 70 - 99 mg/dL  CBG MONITORING, ED      Result Value Ref Range   Glucose-Capillary 164 (*) 70 - 99 mg/dL   A/P: Pt with hx of abdominal pain, leukocytosis, gallstones and imaging findings c/w cholecystitis/pancreatitis and assoc perihepatic fluid. Also with supratherapeutic PT/INR, creat of 1.66 and sig cardiac comorbidities. Tent plan is for percutaneous cholecystostomy and aspiration of perihepatic fluid once PT/INR normalized. Details/risks of procedure d/w pt with his understanding and consent.

## 2013-10-26 NOTE — ED Provider Notes (Addendum)
CSN: [id number]     Arrival date & time 10/25/13  2201 History   First MD Initiated Contact with Patient 10/26/13 0036     Chief Complaint  Patient presents with  . Abdominal Pain      HPI Patient presents with intermittent upper abdominal pain with associated nausea treatments the week.  His pain is constant and located in his upper abdomen.  He had a CT scan done in outside hospital that demonstrated cholecystitis.  He chose to come back to [facility] to be admitted to the hospital here where his cardiology team is.  He has a history of cardiomyopathy, atrial fibrillation, CHF and is on anticoagulation with Coumadin.  He's had no fevers or chills.  He reports his pain has been severe   Past Medical History  Diagnosis Date  . Anxiety   . Complete heart block   . Ventricular tachycardia   . Cardiomyopathy   . Atrial fibrillation   . Kidney stones     "once; passed on it's own" (Oct 27, 2013)  . Hypertension   . High cholesterol   . CHF (congestive heart failure)   . Coronary artery disease     with known occlusions of all of his native coronary  . Anterior myocardial infarction 1982; 1995  . Asthma     "when I was a boy"  . Pneumonia     "couple times" (10-27-2013)  . Type II diabetes mellitus    Past Surgical History  Procedure Laterality Date  . Bi-ventricular implantable cardioverter defibrillator  (crt-d)  02/2008; 10-27-13    MDT CRTD implanted by Dr Ladona Ridgel for primary prevention and CHF 2009 with 6949 lead; 09/2013 - gen change and RV lead revision by Dr Ladona Ridgel  . Cataract extraction w/ intraocular lens  implant, bilateral Bilateral ?2007  . Cardiac catheterization      "several"  . Coronary artery bypass graft  1982/1996    "CABG X5; CABG X4"    History reviewed. No pertinent family history. History  Substance Use Topics  . Smoking status: Former Smoker -- 4.00 packs/day for 12 years    Types: Cigarettes  . Smokeless tobacco: Never Used     Comment: "quit smoking  cigarettes in 1975  . Alcohol Use: No    Review of Systems  All other systems reviewed and are negative.     Allergies  Statins; Fenofibrate; Penicillins; and Sulfonamide derivatives  Home Medications   Prior to Admission medications   Medication Sig Start Date End Date Taking? Authorizing Provider  ALPHAGAN P 0.1 % SOLN Place 1 drop into both eyes 2 (two) times daily.  09/09/13  Yes Historical Provider, MD  aspirin 81 MG tablet Take 81 mg by mouth daily.     Yes Historical Provider, MD  benazepril (LOTENSIN) 40 MG tablet Take 20 mg by mouth 2 (two) times daily.    Yes Historical Provider, MD  ciprofloxacin (CIPRO) 500 MG tablet Take 500 mg by mouth 2 (two) times daily.   Yes Historical Provider, MD  Coenzyme Q10 (COQ10) 100 MG CAPS Take 1 capsule by mouth daily.   Yes Historical Provider, MD  Cyanocobalamin (B-12 DOTS) 500 MCG TBDP Take 500 mg by mouth daily.    Yes Historical Provider, MD  digoxin (LANOXIN) 0.25 MG tablet Take 250 mcg by mouth daily.     Yes Historical Provider, MD  ezetimibe (ZETIA) 10 MG tablet Take 10 mg by mouth daily.     Yes Historical Provider, MD  furosemide (LASIX)  40 MG tablet Take 40 mg by mouth 2 (two) times daily.  04/16/11  Yes Marinus Maw, MD  metoprolol (LOPRESSOR) 50 MG tablet Take 50 mg by mouth 2 (two) times daily.    Yes Historical Provider, MD  Multiple Vitamins-Minerals (MULTIVITAMIN WITH MINERALS) tablet Take 1 tablet by mouth daily.     Yes Historical Provider, MD  pioglitazone (ACTOS) 15 MG tablet Take 15 mg by mouth daily.   Yes Historical Provider, MD  TRAVATAN Z 0.004 % SOLN ophthalmic solution Place 1 drop into both eyes at bedtime.  09/09/13  Yes Historical Provider, MD  ursodiol (ACTIGALL) 300 MG capsule Take 300 mg by mouth 2 (two) times daily.     Yes Historical Provider, MD  valsartan (DIOVAN) 160 MG tablet Take 80 mg by mouth 2 (two) times daily.    Yes Historical Provider, MD  warfarin (COUMADIN) 5 MG tablet Take 5 mg by mouth  daily.   Yes Historical Provider, MD   BP 117/53  Pulse 70  Temp(Src) 97.9 F (36.6 C) (Oral)  Resp 25  SpO2 98% Physical Exam  Nursing note and vitals reviewed. Constitutional: He is oriented to person, place, and time. He appears well-developed and well-nourished.  HENT:  Head: Normocephalic and atraumatic.  Eyes: EOM are normal.  Neck: Normal range of motion.  Cardiovascular: Normal rate, regular rhythm, normal heart sounds and intact distal pulses.   Pulmonary/Chest: Effort normal and breath sounds normal. No respiratory distress.  Abdominal: Soft. He exhibits no distension.  Tenderness is epigastric and right upper quadrant  Musculoskeletal: Normal range of motion.  Neurological: He is alert and oriented to person, place, and time.  Skin: Skin is warm and dry.  Psychiatric: He has a normal mood and affect. Judgment normal.    ED Course  Procedures (including critical care time) Labs Review Labs Reviewed  CBC WITH DIFFERENTIAL - Abnormal; Notable for the following:    WBC 28.9 (*)    RBC 3.81 (*)    Hemoglobin 11.0 (*)    HCT 31.9 (*)    RDW 15.6 (*)    Neutrophils Relative % 84 (*)    Lymphocytes Relative 3 (*)    Neutro Abs 26.8 (*)    Monocytes Absolute 1.2 (*)    All other components within normal limits  COMPREHENSIVE METABOLIC PANEL - Abnormal; Notable for the following:    Sodium 127 (*)    Chloride 90 (*)    Glucose, Bld 209 (*)    BUN 67 (*)    Creatinine, Ser 1.66 (*)    Albumin 2.6 (*)    AST 39 (*)    GFR calc non Af Amer 41 (*)    GFR calc Af Amer 47 (*)    All other components within normal limits  LIPASE, BLOOD - Abnormal; Notable for the following:    Lipase 226 (*)    All other components within normal limits  PROTIME-INR  DIGOXIN LEVEL    Imaging Review Dg Chest 2 View  10/26/2013   CLINICAL DATA:  Recent pneumonia  EXAM: CHEST  2 VIEW  COMPARISON:  10/09/2013  FINDINGS: Cardiac shadow is stable. A pacing device is again noted. Chronic  left pleural effusion is again noted. Scarring is seen in the left lung base. Some new right basilar and density is noted which projects in the right middle lobe. No other focal abnormality is noted.  IMPRESSION: Chronic changes in the left base.  New right basilar infiltrate projecting in  the right middle lobe on the lateral projection.   Electronically Signed   By: Alcide CleverMark  Lukens M.D.   On: 10/26/2013 01:41   [place] Abdomen Complete  10/26/2013   CLINICAL DATA:  Abdominal pain  EXAM: ULTRASOUND ABDOMEN COMPLETE  COMPARISON:  09/16/2013  FINDINGS: Gallbladder:  Multiple gallstones are again identified. The wall is thickened 8.6 mm although no sonographic Eulah PontMurphy sign is seen.  Common bile duct:  Diameter: 6 mm  Liver:  There is a septated lesion identified within the right lobe of the liver. This was not well appreciated on the prior exam no more on a prior CT from 2008.  IVC:  No abnormality visualized.  Pancreas:  Visualized portion unremarkable.  Spleen:  Size and appearance within normal limits.  Right Kidney:  Length: 12.6 cm. Echogenicity within normal limits. No mass or hydronephrosis visualized.  Left Kidney:  Length: 12.3 cm.  2.9 cm cyst within the lower pole of left kidney.  Abdominal aorta:  No aneurysm visualized.  Other findings:  None.  IMPRESSION: Cholelithiasis with gallbladder wall thickening.  Septated lesion within the right lobe of the liver which was not well appreciated on the prior study. This measures approximately 5 cm in greatest dimension. This may simply represent a septated cyst. Further evaluation is recommended.   Electronically Signed   By: Alcide CleverMark  Lukens M.D.   On: 10/26/2013 02:59  I personally reviewed the imaging tests through PACS system I reviewed available ER/hospitalization records through the EMR   ECG interpretation   Date: 10/26/2013  Rate: 70  Rhythm: Ventricular paced rhythm   Narrative Interpretation:   Old EKG Reviewed: No significant changes noted     MDM    Final diagnoses:  Cholecystitis    Ultrasound concerning for cholecystitis.  White count 28,000.  Patient be given an IV dose of Zosyn.  Patient be admitted to the hospital.  Patient likely will benefit from IV antibiotics for several days prior cholecystectomy given his multiple medical issues.  Digoxin level and INR pending.   Lyanne CoKevin M Cathrine Krizan, MD 10/26/13 [phone]  Lyanne CoKevin M Fanchon Papania, MD 10/26/13 [phone]

## 2013-10-27 ENCOUNTER — Inpatient Hospital Stay (HOSPITAL_COMMUNITY): Payer: Medicare Other

## 2013-10-27 DIAGNOSIS — I2510 Atherosclerotic heart disease of native coronary artery without angina pectoris: Secondary | ICD-10-CM

## 2013-10-27 LAB — COMPREHENSIVE METABOLIC PANEL
ALBUMIN: 2.1 g/dL — AB (ref 3.5–5.2)
ALK PHOS: 85 U/L (ref 39–117)
ALT: 27 U/L (ref 0–53)
AST: 39 U/L — ABNORMAL HIGH (ref 0–37)
BUN: 41 mg/dL — ABNORMAL HIGH (ref 6–23)
CALCIUM: 8.4 mg/dL (ref 8.4–10.5)
CO2: 26 mEq/L (ref 19–32)
Chloride: 99 mEq/L (ref 96–112)
Creatinine, Ser: 1.32 mg/dL (ref 0.50–1.35)
GFR calc non Af Amer: 54 mL/min — ABNORMAL LOW (ref >90)
GFR, EST AFRICAN AMERICAN: 62 mL/min — AB (ref >90)
GLUCOSE: 131 mg/dL — AB (ref 70–99)
POTASSIUM: 4.4 meq/L (ref 3.7–5.3)
SODIUM: 136 meq/L — AB (ref 137–147)
TOTAL PROTEIN: 5.4 g/dL — AB (ref 6.0–8.3)
Total Bilirubin: 0.8 mg/dL (ref 0.3–1.2)

## 2013-10-27 LAB — PROTIME-INR
INR: 1.76 — ABNORMAL HIGH (ref 0.00–1.49)
INR: 1.51 — ABNORMAL HIGH (ref 0.00–1.49)
PROTHROMBIN TIME: 17.8 s — AB (ref 11.6–15.2)
Prothrombin Time: 20 seconds — ABNORMAL HIGH (ref 11.6–15.2)

## 2013-10-27 LAB — GLUCOSE, CAPILLARY
GLUCOSE-CAPILLARY: 112 mg/dL — AB (ref 70–99)
GLUCOSE-CAPILLARY: 140 mg/dL — AB (ref 70–99)
GLUCOSE-CAPILLARY: 122 mg/dL — AB (ref 70–99)
Glucose-Capillary: 157 mg/dL — ABNORMAL HIGH (ref 70–99)
Glucose-Capillary: 127 mg/dL — ABNORMAL HIGH (ref 70–99)

## 2013-10-27 LAB — CBC
HCT: 27.4 % — ABNORMAL LOW (ref 39.0–52.0)
Hemoglobin: 9.5 g/dL — ABNORMAL LOW (ref 13.0–17.0)
MCH: 29.1 pg (ref 26.0–34.0)
MCHC: 34.7 g/dL (ref 30.0–36.0)
MCV: 83.8 fL (ref 78.0–100.0)
Platelets: 305 10*3/uL (ref 150–400)
RBC: 3.27 MIL/uL — ABNORMAL LOW (ref 4.22–5.81)
RDW: 15.5 % (ref 11.5–15.5)
WBC: 23.3 10*3/uL — ABNORMAL HIGH (ref 4.0–10.5)

## 2013-10-27 MED ORDER — MIDAZOLAM HCL 2 MG/2ML IJ SOLN
INTRAMUSCULAR | Status: AC | PRN
  Administered 2013-10-27: 0.5 mg via INTRAVENOUS
  Administered 2013-10-27: 1 mg via INTRAVENOUS
  Administered 2013-10-27: 0.5 mg via INTRAVENOUS

## 2013-10-27 MED ORDER — MIDAZOLAM HCL 2 MG/2ML IJ SOLN
INTRAMUSCULAR | Status: AC
  Filled 2013-10-27: qty 4

## 2013-10-27 MED ORDER — IOHEXOL 300 MG/ML  SOLN
50.0000 mL | Freq: Once | INTRAMUSCULAR | Status: AC | PRN
  Administered 2013-10-27: 15 mL via INTRAVENOUS

## 2013-10-27 MED ORDER — FENTANYL CITRATE 0.05 MG/ML IJ SOLN
INTRAMUSCULAR | Status: AC
  Filled 2013-10-27: qty 2

## 2013-10-27 MED ORDER — FENTANYL CITRATE 0.05 MG/ML IJ SOLN
INTRAMUSCULAR | Status: AC | PRN
Start: 2013-10-27 — End: 2013-10-27
  Administered 2013-10-27 (×2): 25 ug via INTRAVENOUS
  Administered 2013-10-27: 12.5 ug via INTRAVENOUS

## 2013-10-27 MED ORDER — DEXTROSE-NACL 5-0.9 % IV SOLN
INTRAVENOUS | Status: DC
  Administered 2013-10-27: 03:00:00 via INTRAVENOUS
  Administered 2013-10-27: 50 mL/h via INTRAVENOUS

## 2013-10-27 NOTE — Progress Notes (Signed)
Pt. Seen and examined. Agree with the NP/PA-C note as written.  Proceed with surgery if indicated. We will be available as needed after surgery. May need to temporarily disable ICD for surgery - will discuss with EP.  Chrystie Nose, MD, [facility] Attending Cardiologist [facility]

## 2013-10-27 NOTE — Sedation Documentation (Signed)
O2 removed

## 2013-10-27 NOTE — Progress Notes (Signed)
Subjective: Still with ruq abdominal pain radiating to the back   Objective: Vital signs in last 24 hours: Temp:  [97.5 F (36.4 C)-98.2 F (36.8 C)] 97.8 F (36.6 C) (05/13 0506) Pulse Rate:  [64-72] 69 (05/13 0506) Resp:  [13-29] 18 (05/13 0506) BP: (90-116)/(40-64) 107/63 mmHg (05/13 0506) SpO2:  [95 %-98 %] 96 % (05/13 0506) Last BM Date: 10/25/13  Intake/Output from previous day: 05/12 0701 - 05/13 0700 In: 1062.5 [I.V.:762.5; IV Piggyback:300] Out: 200 [Urine:200] Intake/Output this shift:    General appearance: no distress GI: ruq tenderness, ?palpable gb  Lab Results:   Recent Labs  10/25/13 2224 10/27/13 0450  WBC 28.9* 23.3*  HGB 11.0* 9.5*  HCT 31.9* 27.4*  PLT 338 305   BMET  Recent Labs  10/25/13 2224 10/27/13 0450  NA 127* 136*  K 4.6 4.4  CL 90* 99  CO2 26 26  GLUCOSE 209* 131*  BUN 67* 41*  CREATININE 1.66* 1.32  CALCIUM 8.6 8.4   PT/INR  Recent Labs  10/26/13 2014 10/27/13 0450  LABPROT 22.8* 20.0*  INR 2.09* 1.76*   ABG No results found for this basename: PHART, PCO2, PO2, HCO3,  in the last 72 hours  Studies/Results: Dg Chest 2 View  10/26/2013   CLINICAL DATA:  Recent pneumonia  EXAM: CHEST  2 VIEW  COMPARISON:  10/09/2013  FINDINGS: Cardiac shadow is stable. A pacing device is again noted. Chronic left pleural effusion is again noted. Scarring is seen in the left lung base. Some new right basilar and density is noted which projects in the right middle lobe. No other focal abnormality is noted.  IMPRESSION: Chronic changes in the left base.  New right basilar infiltrate projecting in the right middle lobe on the lateral projection.   Electronically Signed   By: Alcide CleverMark  Lukens M.D.   On: 10/26/2013 01:41   [place] Abdomen Complete  10/26/2013   CLINICAL DATA:  Abdominal pain  EXAM: ULTRASOUND ABDOMEN COMPLETE  COMPARISON:  09/16/2013  FINDINGS: Gallbladder:  Multiple gallstones are again identified. The wall is thickened 8.6 mm  although no sonographic Eulah PontMurphy sign is seen.  Common bile duct:  Diameter: 6 mm  Liver:  There is a septated lesion identified within the right lobe of the liver. This was not well appreciated on the prior exam no more on a prior CT from 2008.  IVC:  No abnormality visualized.  Pancreas:  Visualized portion unremarkable.  Spleen:  Size and appearance within normal limits.  Right Kidney:  Length: 12.6 cm. Echogenicity within normal limits. No mass or hydronephrosis visualized.  Left Kidney:  Length: 12.3 cm.  2.9 cm cyst within the lower pole of left kidney.  Abdominal aorta:  No aneurysm visualized.  Other findings:  None.  IMPRESSION: Cholelithiasis with gallbladder wall thickening.  Septated lesion within the right lobe of the liver which was not well appreciated on the prior study. This measures approximately 5 cm in greatest dimension. This may simply represent a septated cyst. Further evaluation is recommended.   Electronically Signed   By: Alcide CleverMark  Lukens M.D.   On: 10/26/2013 02:59    Anti-infectives: Anti-infectives   Start     Dose/Rate Route Frequency Ordered Stop   10/26/13 2000  vancomycin (VANCOCIN) IVPB 750 mg/150 ml premix     750 mg 150 mL/hr over 60 Minutes Intravenous Every 12 hours 10/26/13 0709     10/26/13 0715  vancomycin (VANCOCIN) IVPB 1000 mg/200 mL premix     1,000  mg 200 mL/hr over 60 Minutes Intravenous  Once 10/26/13 0709 10/26/13 1137   10/26/13 0715  piperacillin-tazobactam (ZOSYN) IVPB 3.375 g     3.375 g 12.5 mL/hr over 240 Minutes Intravenous Every 8 hours 10/26/13 0709     10/26/13 0315  piperacillin-tazobactam (ZOSYN) IVPB 3.375 g     3.375 g 12.5 mL/hr over 240 Minutes Intravenous  Once 10/26/13 0303 10/26/13 2395      Assessment/Plan: Cholecystitis with possible hepatic abscess  Correct inr, proceed with gb and likely hepatic abscess drainage when ir ready to do so.  Ideally should be reversed and this would be done today after rechecking his inr Discussed  role of surgery for failure and likely need for surgery in the future  Emelia Loron 10/27/2013

## 2013-10-27 NOTE — Procedures (Signed)
Interventional Radiology Procedure Note  Procedure: 1.) Placed 35F perc cholecystostomy tube.  > 60 mL black bile aspirated and sent for culture. 2.) Aspiration of subcapsular fluid around left liver revealed black bile, a 2nd 35F drain was placed.  45 mL bile aspirated.  Both drains connected to gravity bags. Complications: None Recommendations: - Drains to gravity - Flush BID - Left drain likely able to come out prior to D/C - Chole drain must remain until pt able to have cholecystectomy  Signed,  Sterling Big, MD [facility]

## 2013-10-27 NOTE — Progress Notes (Signed)
Cardiology Consult Note  Admit date: 10/26/2013 Name: Spencer CETINA Sr. 68 y.o.  male DOB:  [birth year] MRN:  [id number]  Today's date:  10/27/2013  Referring Physician:    [facility]  Reason for Consultation:   Cardiac evaluation  IMPRESSIONS: 1. Acute cholecystitis with possible hepatic abscess and pancreatitis 2. Ischemic cardiomyopathy with ejection fraction of 35% previously the echocardiogram 3. Recent defibrillator change out 4. Right middle lobe pneumonia that may have a complication of the previous defibrillator change out 5. Chronic atrial fibrillation 6. Over anticoagulation with warfarin likely due to recent antibiotics 7. 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 Acute renal failure 9. Hyperlipidemia 10. Diabetes mellitus  RECOMMENDATION: - No changes to current regimen as patient is appropriate for intervention from a cardiology perspective   OVERNIGHT: Improving abdominal pain. No chest pain or dyspnea. No swelling or tenderness of extremities.   HISTORY: This 68 year old male has a history of coronary artery disease with previous anterior infarction previous redo bypass grafting in 1996. He had patent grafts noted at catheterization in July of 2008. His native coronary arteries were occluded. His ventricular function has shown an EF of around 35%.His defibrillator had recently reached elective replacement and he underwent change out of this on 05/06/2024with placement of a new right ventricular lead to replace an old 6949-lead by Dr. Ladona Ridgel.   Past Medical History  Diagnosis Date  . Anxiety   . Complete heart block   . Ventricular tachycardia   . Cardiomyopathy   . Atrial fibrillation   . Kidney stones     "once; passed on it's own" (2013/10/20)  . Hypertension   . High cholesterol   . CHF (congestive heart failure)   . Coronary artery disease     with known occlusions of all of his native coronary  . Anterior myocardial  infarction 1982; 1995  . Asthma     "when I was a boy"  . Pneumonia     "couple times" (2013-10-20)  . Type II diabetes mellitus   . Cholecystitis   . Automatic implantable cardioverter-defibrillator in situ     BI VENTRICULAR      Past Surgical History  Procedure Laterality Date  . Bi-ventricular implantable cardioverter defibrillator  (crt-d)  02/2008; 10-20-2013    MDT CRTD implanted by Dr Ladona Ridgel for primary prevention and CHF 2009 with 6949 lead; 09/2013 - gen change and RV lead revision by Dr Ladona Ridgel  . Cataract extraction w/ intraocular lens  implant, bilateral Bilateral ?2007  . Cardiac catheterization      "several"  . Coronary artery bypass graft  1982/1996    "CABG X5; CABG X4"      Allergies:  is allergic to statins; fenofibrate; penicillins; and sulfonamide derivatives.   Medications: Prior to Admission medications   Medication Sig Start Date End Date Taking? Authorizing Provider  ALPHAGAN P 0.1 % SOLN Place 1 drop into both eyes 2 (two) times daily.  09/09/13  Yes Historical Provider, MD  aspirin 81 MG tablet Take 81 mg by mouth daily.     Yes Historical Provider, MD  benazepril (LOTENSIN) 40 MG tablet Take 20 mg by mouth 2 (two) times daily.    Yes Historical Provider, MD  ciprofloxacin (CIPRO) 500 MG tablet Take 500 mg by mouth 2 (two) times daily.   Yes Historical Provider, MD  Coenzyme Q10 (COQ10) 100 MG CAPS Take 1 capsule by mouth daily.   Yes Historical Provider, MD  Cyanocobalamin (B-12 DOTS)  500 MCG TBDP Take 500 mg by mouth daily.    Yes Historical Provider, MD  digoxin (LANOXIN) 0.25 MG tablet Take 250 mcg by mouth daily.     Yes Historical Provider, MD  ezetimibe (ZETIA) 10 MG tablet Take 10 mg by mouth daily.     Yes Historical Provider, MD  furosemide (LASIX) 40 MG tablet Take 40 mg by mouth 2 (two) times daily.  04/16/11  Yes Marinus MawGregg W Taylor, MD  metoprolol (LOPRESSOR) 50 MG tablet Take 50 mg by mouth 2 (two) times daily.    Yes Historical Provider, MD   Multiple Vitamins-Minerals (MULTIVITAMIN WITH MINERALS) tablet Take 1 tablet by mouth daily.     Yes Historical Provider, MD  pioglitazone (ACTOS) 15 MG tablet Take 15 mg by mouth daily.   Yes Historical Provider, MD  TRAVATAN Z 0.004 % SOLN ophthalmic solution Place 1 drop into both eyes at bedtime.  09/09/13  Yes Historical Provider, MD  ursodiol (ACTIGALL) 300 MG capsule Take 300 mg by mouth 2 (two) times daily.     Yes Historical Provider, MD  valsartan (DIOVAN) 160 MG tablet Take 80 mg by mouth 2 (two) times daily.    Yes Historical Provider, MD  warfarin (COUMADIN) 5 MG tablet Take 5 mg by mouth daily.   Yes Historical Provider, MD    Family History: Family Status  Relation Status Death Age  . Father Deceased     diabetes  . Mother Deceased     CVA  . Brother Alive   . Sister Alive   . Brother Alive     Social History:   reports that he has quit smoking. His smoking use included Cigarettes. He has a 48 pack-year smoking history. He has never used smokeless tobacco. He reports that he does not drink alcohol or use illicit drugs.   History   Social History Narrative  . No narrative on file    Review of Systems: He has significant gallop and also has recurrent sinusitis. He has not had any recent bleeding. Other than as noted above the remainder of the review of systems is unremarkable.  Physical Exam: BP 107/63  Pulse 69  Temp(Src) 97.8 F (36.6 C) (Oral)  Resp 18  Ht 6' 3.98" (1.93 m)  Wt 186 lb 15.2 oz (84.8 kg)  BMI 22.77 kg/m2  SpO2 96% General appearance: Thin, pale-appearing white male, pleasant and conversant Head: Normocephalic, without obvious abnormality, atraumatic Neck: no adenopathy, no carotid bruit, no JVD and supple, symmetrical, trachea midline Lungs: Mild rales heard over right anterior lung field previous generator is without infection or hematoma or erythema, moderate ecchymoses over left chest wall, healed median sternotomy scar Heart: regular  rate and rhythm, S1, S2 normal, no murmur, click, rub or gallop Abdomen: Significant tenderness the right upper quadrant without rebound, bowel sounds present Extremities: extremities normal, atraumatic, no cyanosis or edema Pulses: 2+ and symmetric Skin: Skin color, texture, turgor normal. No rashes or lesions Neurologic: Grossly normal  Labs: CBC  Recent Labs  10/25/13 2224 10/27/13 0450  WBC 28.9* 23.3*  RBC 3.81* 3.27*  HGB 11.0* 9.5*  HCT 31.9* 27.4*  PLT 338 305  MCV 83.7 83.8  MCH 28.9 29.1  MCHC 34.5 34.7  RDW 15.6* 15.5  LYMPHSABS 0.9  --   MONOABS 1.2*  --   EOSABS 0.0  --   BASOSABS 0.0  --    CMP   Recent Labs  10/27/13 0450  NA 136*  K 4.4  CL 99  CO2 26  GLUCOSE 131*  BUN 41*  CREATININE 1.32  CALCIUM 8.4  PROT 5.4*  ALBUMIN 2.1*  AST 39*  ALT 27  ALKPHOS 85  BILITOT 0.8  GFRNONAA 54*  GFRAA 62*   Radiology: Cardiomegaly, new right middle lobe infiltrate  EKG 10/26/13: Underlying rhythm is atrial fibrillation with ventricular paced,  Signed:  Si Raider. Clinton Sawyer, MD, MBA 10/27/2013, 10:19 AM Family Medicine Resident, PGY-3    10/27/2013, 10:03 AM

## 2013-10-27 NOTE — Progress Notes (Signed)
Patient ID: Spencer Shelton Sr., male   DOB: [birth year], 68 y.o.   MRN: [id number] [facility] Gastroenterology Progress Note  Spencer Shelton Sr. 68 y.o. January 24, 1946   Subjective: Abdominal pain intermittently last night. Denies vomiting. Wife at bedside.  Objective: Vital signs in last 24 hours: Filed Vitals:   10/27/13 0506  BP: 107/63  Pulse: 69  Temp: 97.8 F (36.6 C)  Resp: 18    Physical Exam: Gen: alert, no acute distress Abd: diffuse tenderness with guarding (greatest in upper quadrants), soft, nondistended, +BS  Lab Results:  Recent Labs  10/25/13 2224 10/27/13 0450  NA 127* 136*  K 4.6 4.4  CL 90* 99  CO2 26 26  GLUCOSE 209* 131*  BUN 67* 41*  CREATININE 1.66* 1.32  CALCIUM 8.6 8.4    Recent Labs  10/25/13 2224 10/27/13 0450  AST 39* 39*  ALT 26 27  ALKPHOS 105 85  BILITOT 0.5 0.8  PROT 6.7 5.4*  ALBUMIN 2.6* 2.1*    Recent Labs  10/25/13 2224 10/27/13 0450  WBC 28.9* 23.3*  NEUTROABS 26.8*  --   HGB 11.0* 9.5*  HCT 31.9* 27.4*  MCV 83.7 83.8  PLT 338 305    Recent Labs  10/26/13 2014 10/27/13 0450  LABPROT 22.8* 20.0*  INR 2.09* 1.76*      Assessment/Plan: 68 yo with acute cholecystitis and gallstone pancreatitis with a plan for a perc drain today by IR if INR permits. Continue broad spectrum antibiotics. NPO. Supportive care.   Spencer Shelton 10/27/2013, 8:49 AM

## 2013-10-27 NOTE — Progress Notes (Signed)
[facility] PROGRESS NOTE   Spencer Shelton EMV:[id number] DOB: April 02, 1946 DOA: 10/26/2013 PCP: Pcp Not In System  HPI/Subjective: Seen with wife at bedside, she complained about RUQ abdominal pain and tenderness. INR is 1.5, likely to have perc drain placed today.  Assessment/Plan: Principal Problem:   Acute cholecystitis Active Problems:   Essential hypertension, benign   Atrial fibrillation   Chronic systolic heart failure   Biventricular implantable cardioverter-defibrillator in situ   Ventricular tachycardia (paroxysmal)   Abdominal pain, acute, right upper quadrant   Cholecystitis   Dehydration   Acute renal failure   PNA (pneumonia)   CAD (coronary artery disease), native coronary artery   Cirrhosis   Sepsis secondary to acute cholecystitis  -Patient had leukocytosis as well as tachypnea tach admission  -Continue IV fluids, vancomycin, Zosyn.  Abdominal pain secondary to acute cholecystitis  -Abdominal ultrasound shows cholelithiasis with gallbladder wall thickening, or a lesion of the right lobe of the liver. -Patient has had a CT scan that was done yesterday at [facility], [place] which showed hepatic fluid collection  -General surgery consulted  -Interventional radiology consulted for percutaneous drainage  -Continue IV fluid, n.p.o. status, vancomycin and Zosyn for a period coverage  -Cardiology also consulted for possible surgery clearance   Recent pneumonia -Patient treated for pneumonia recently, with Levaquin. -Chest x-ray showed right middle lobe pneumonia. Patient is on Zosyn and vancomycin.  Atrial fibrillation with supratherapeutic INR  -Currently on Coumadin  -Have the ICD implanted  -INR 7.8, will give vitamin K to reverse.  -Will check INR later this evening, patient may need further reversal  -Digoxin currently held, patient pending dig level   Chronic systolic heart failure  -Last documented EF was in 2004, 40%  -Dr. Donnie Aho  consulted  -Continue daily weights, monitoring of intake and output   Type 2 diabetes mellitus  -Actos held  -Will and on insulin sliding with CBG monitoring every 4 hours  -Currently patient is n.p.o.   Hypertension  -Losartan, Lasix, benazepril held  -Currently hypotensive  -Will continue IV fluids   Code Status: Full code Family Communication: Plan discussed with the patient. Disposition Plan: Remains inpatient   Consultants:  General surgery.  Interventional radiology.  Gastroenterology.  Cardiology.  Procedures:  Percutaneous biliary drain to be placed today  Antibiotics:  Vanc and Zosyn.   Objective: Filed Vitals:   10/27/13 0506  BP: 107/63  Pulse: 69  Temp: 97.8 F (36.6 C)  Resp: 18    Intake/Output Summary (Last 24 hours) at 10/27/13 1229 Last data filed at 10/27/13 5300  Gross per 24 hour  Intake 1062.5 ml  Output    200 ml  Net  862.5 ml   Filed Weights   10/26/13 0600  Weight: 84.8 kg (186 lb 15.2 oz)    Exam: General: Alert and awake, oriented x3, not in any acute distress. HEENT: anicteric sclera, pupils reactive to light and accommodation, EOMI CVS: S1-S2 clear, no murmur rubs or gallops Chest: clear to auscultation bilaterally, no wheezing, rales or rhonchi Abdomen: soft nontender, nondistended, normal bowel sounds, no organomegaly Extremities: no cyanosis, clubbing or edema noted bilaterally Neuro: Cranial nerves II-XII intact, no focal neurological deficits  Data Reviewed: Basic Metabolic Panel:  Recent Labs Lab 10/25/13 2224 10/27/13 0450  NA 127* 136*  K 4.6 4.4  CL 90* 99  CO2 26 26  GLUCOSE 209* 131*  BUN 67* 41*  CREATININE 1.66* 1.32  CALCIUM 8.6 8.4   Liver Function Tests:  Recent Labs Lab 10/25/13 2224 10/27/13 0450  AST 39* 39*  ALT 26 27  ALKPHOS 105 85  BILITOT 0.5 0.8  PROT 6.7 5.4*  ALBUMIN 2.6* 2.1*    Recent Labs Lab 10/25/13 2224  LIPASE 226*   No results found for this basename:  AMMONIA,  in the last 168 hours CBC:  Recent Labs Lab 10/25/13 2224 10/27/13 0450  WBC 28.9* 23.3*  NEUTROABS 26.8*  --   HGB 11.0* 9.5*  HCT 31.9* 27.4*  MCV 83.7 83.8  PLT 338 305   Cardiac Enzymes: No results found for this basename: CKTOTAL, CKMB, CKMBINDEX, TROPONINI,  in the last 168 hours BNP (last 3 results) No results found for this basename: PROBNP,  in the last 8760 hours CBG:  Recent Labs Lab 10/26/13 2000 10/26/13 2340 10/27/13 0330 10/27/13 0747 10/27/13 1203  GLUCAP 114* 134* 112* 140* 157*    Micro No results found for this or any previous visit (from the past 240 hour(s)).   Studies: Dg Chest 2 View  10/26/2013   CLINICAL DATA:  Recent pneumonia  EXAM: CHEST  2 VIEW  COMPARISON:  10/09/2013  FINDINGS: Cardiac shadow is stable. A pacing device is again noted. Chronic left pleural effusion is again noted. Scarring is seen in the left lung base. Some new right basilar and density is noted which projects in the right middle lobe. No other focal abnormality is noted.  IMPRESSION: Chronic changes in the left base.  New right basilar infiltrate projecting in the right middle lobe on the lateral projection.   Electronically Signed   By: Alcide CleverMark  Lukens M.D.   On: 10/26/2013 01:41   [place] Abdomen Complete  10/26/2013   CLINICAL DATA:  Abdominal pain  EXAM: ULTRASOUND ABDOMEN COMPLETE  COMPARISON:  09/16/2013  FINDINGS: Gallbladder:  Multiple gallstones are again identified. The wall is thickened 8.6 mm although no sonographic Eulah PontMurphy sign is seen.  Common bile duct:  Diameter: 6 mm  Liver:  There is a septated lesion identified within the right lobe of the liver. This was not well appreciated on the prior exam no more on a prior CT from 2008.  IVC:  No abnormality visualized.  Pancreas:  Visualized portion unremarkable.  Spleen:  Size and appearance within normal limits.  Right Kidney:  Length: 12.6 cm. Echogenicity within normal limits. No mass or hydronephrosis visualized.   Left Kidney:  Length: 12.3 cm.  2.9 cm cyst within the lower pole of left kidney.  Abdominal aorta:  No aneurysm visualized.  Other findings:  None.  IMPRESSION: Cholelithiasis with gallbladder wall thickening.  Septated lesion within the right lobe of the liver which was not well appreciated on the prior study. This measures approximately 5 cm in greatest dimension. This may simply represent a septated cyst. Further evaluation is recommended.   Electronically Signed   By: Alcide CleverMark  Lukens M.D.   On: 10/26/2013 02:59    Scheduled Meds: . antiseptic oral rinse  15 mL Mouth Rinse q12n4p  . chlorhexidine  15 mL Mouth Rinse BID  . insulin aspart  0-9 Units Subcutaneous 6 times per day  . piperacillin-tazobactam (ZOSYN)  IV  3.375 g Intravenous Q8H  . vancomycin  750 mg Intravenous Q12H   Continuous Infusions: . dextrose 5 % and 0.9% NaCl 75 mL/hr at 10/27/13 0234       Time spent: 35 minutes    Clydia LlanoMutaz Braylin Formby  [facility] Pager [phone] If 7PM-7AM, please contact night-coverage at [email or web address], password [email or web address] 10/27/2013, 12:29  PM  LOS: 1 day

## 2013-10-28 DIAGNOSIS — K859 Acute pancreatitis without necrosis or infection, unspecified: Secondary | ICD-10-CM

## 2013-10-28 LAB — GLUCOSE, CAPILLARY
GLUCOSE-CAPILLARY: 166 mg/dL — AB (ref 70–99)
Glucose-Capillary: 129 mg/dL — ABNORMAL HIGH (ref 70–99)
Glucose-Capillary: 132 mg/dL — ABNORMAL HIGH (ref 70–99)
Glucose-Capillary: 162 mg/dL — ABNORMAL HIGH (ref 70–99)
Glucose-Capillary: 165 mg/dL — ABNORMAL HIGH (ref 70–99)
Glucose-Capillary: 177 mg/dL — ABNORMAL HIGH (ref 70–99)

## 2013-10-28 LAB — CBC
HEMATOCRIT: 28.3 % — AB (ref 39.0–52.0)
Hemoglobin: 9.5 g/dL — ABNORMAL LOW (ref 13.0–17.0)
MCH: 28.5 pg (ref 26.0–34.0)
MCHC: 33.6 g/dL (ref 30.0–36.0)
MCV: 85 fL (ref 78.0–100.0)
Platelets: 327 10*3/uL (ref 150–400)
RBC: 3.33 MIL/uL — ABNORMAL LOW (ref 4.22–5.81)
RDW: 16.1 % — ABNORMAL HIGH (ref 11.5–15.5)
WBC: 16.2 10*3/uL — AB (ref 4.0–10.5)

## 2013-10-28 LAB — BASIC METABOLIC PANEL
BUN: 25 mg/dL — AB (ref 6–23)
CHLORIDE: 104 meq/L (ref 96–112)
CO2: 26 meq/L (ref 19–32)
Calcium: 8.6 mg/dL (ref 8.4–10.5)
Creatinine, Ser: 1.23 mg/dL (ref 0.50–1.35)
GFR calc Af Amer: 68 mL/min — ABNORMAL LOW (ref >90)
GFR calc non Af Amer: 59 mL/min — ABNORMAL LOW (ref >90)
Glucose, Bld: 132 mg/dL — ABNORMAL HIGH (ref 70–99)
Potassium: 5 mEq/L (ref 3.7–5.3)
Sodium: 140 mEq/L (ref 137–147)

## 2013-10-28 LAB — VANCOMYCIN, TROUGH: VANCOMYCIN TR: 12 ug/mL (ref 10.0–20.0)

## 2013-10-28 MED ORDER — INSULIN ASPART 100 UNIT/ML ~~LOC~~ SOLN
0.0000 [IU] | Freq: Three times a day (TID) | SUBCUTANEOUS | Status: DC
  Administered 2013-10-29: 3 [IU] via SUBCUTANEOUS
  Administered 2013-10-29 – 2013-10-30 (×2): 2 [IU] via SUBCUTANEOUS
  Administered 2013-10-30: 3 [IU] via SUBCUTANEOUS
  Administered 2013-10-31: 2 [IU] via SUBCUTANEOUS

## 2013-10-28 MED ORDER — HYDROCODONE-ACETAMINOPHEN 5-325 MG PO TABS
1.0000 | ORAL_TABLET | ORAL | Status: DC | PRN
  Administered 2013-10-28 – 2013-10-30 (×4): 1 via ORAL
  Administered 2013-10-31: 2 via ORAL
  Administered 2013-10-31: 1 via ORAL
  Filled 2013-10-28 (×3): qty 1
  Filled 2013-10-28 (×2): qty 2
  Filled 2013-10-28: qty 1

## 2013-10-28 MED ORDER — INSULIN ASPART 100 UNIT/ML ~~LOC~~ SOLN: 0.0000 [IU] | Freq: Every day | SUBCUTANEOUS | Status: DC

## 2013-10-28 MED ORDER — VANCOMYCIN HCL IN DEXTROSE 1-5 GM/200ML-% IV SOLN
1000.0000 mg | Freq: Two times a day (BID) | INTRAVENOUS | Status: DC
  Administered 2013-10-29 – 2013-10-31 (×5): 1000 mg via INTRAVENOUS
  Filled 2013-10-28 (×7): qty 200

## 2013-10-28 NOTE — Care Management Note (Signed)
    Page 1 of 1   10/28/2013     5:30:56 PM CARE MANAGEMENT NOTE 10/28/2013  Patient:  Spencer Shelton, Spencer Shelton   Account Number:  [id number]  Date Initiated:  10/28/2013  Documentation initiated by:  Letha Cape  Subjective/Objective Assessment:   dx acute cholecycititis  admit- lives with spouse     Action/Plan:   Anticipated DC Date:  10/29/2013   Anticipated DC Plan:  HOME W HOME HEALTH SERVICES      DC Planning Services  CM consult      Choice offered to / List presented to:             Status of service:  In process, will continue to follow Medicare Important Message given?   (If response is "NO", the following Medicare IM given date fields will be blank) Date Medicare IM given:   Date Additional Medicare IM given:  10/28/2013  Discharge Disposition:    Per UR Regulation:  Reviewed for med. necessity/level of care/duration of stay  If discussed at Long Length of Stay Meetings, dates discussed:    Comments:

## 2013-10-28 NOTE — Progress Notes (Signed)
[facility] PROGRESS NOTE   Spencer Shelton. WUJ:[id number] DOB: 09/09/1945 DOA: 10/26/2013 PCP: Pcp Not In System  HPI/Subjective: Percutaneous drain placed by interventional radiology yesterday. Patient seen with wife at bedside, feels much better today, no fever, minimal RUQ abdominal pain. IR/general surgery please advise when it's okay to restart Coumadin.  Assessment/Plan: Principal Problem:   Acute cholecystitis Active Problems:   Essential hypertension, benign   Atrial fibrillation   Chronic systolic heart failure   Biventricular implantable cardioverter-defibrillator in situ   Ventricular tachycardia (paroxysmal)   Abdominal pain, acute, right upper quadrant   Cholecystitis   Dehydration   Acute renal failure   PNA (pneumonia)   CAD (coronary artery disease), native coronary artery   Cirrhosis   Sepsis secondary to acute cholecystitis  -Patient had leukocytosis as well as tachypnea tach admission  -Continue IV fluids, vancomycin, Zosyn.  Abdominal pain secondary to acute cholecystitis  -Abdominal ultrasound shows cholelithiasis with gallbladder wall thickening, or a lesion of the right lobe of the liver. -[facility] [place] which showed hepatic fluid collection  -General surgery consulted, cardiology consulted on admission and cleared him for any upcoming procedure. -Interventional radiology consulted for percutaneous drainage  -Continue IV fluid, vancomycin and Zosyn.  Recent pneumonia -Patient treated for pneumonia recently, with Levaquin. -Chest x-ray showed right middle lobe pneumonia. Patient is on Zosyn and vancomycin.  Atrial fibrillation with supratherapeutic INR  -Currently on Coumadin  -Have the ICD implanted  -INR 7.8, will give vitamin K to reverse.  -Will check INR later this evening, patient may need further reversal  -Digoxin currently held, patient pending dig level   Chronic  systolic heart failure  -Last documented EF was in 2004, 40%  -Dr. Donnie Ahoilley consulted  -Continue daily weights, monitoring of intake and output   Type 2 diabetes mellitus  -Actos held  -Will and on insulin sliding with CBG monitoring every 4 hours    Hypertension  -Losartan, Lasix, benazepril held  -Currently hypotensive  -Will continue IV fluids   Code Status: Full code Family Communication: Plan discussed with the patient. Disposition Plan: Remains inpatient   Consultants:  General surgery.  Interventional radiology.  Gastroenterology.  Cardiology.  Procedures:  Percutaneous biliary drain to be placed today  Antibiotics:  Vanc and Zosyn.   Objective: Filed Vitals:   10/28/13 0410  BP: 110/68  Pulse: 69  Temp: 98.6 F (37 C)  Resp: 16    Intake/Output Summary (Last 24 hours) at 10/28/13 1135 Last data filed at 10/28/13 0953  Gross per 24 hour  Intake    825 ml  Output    250 ml  Net    575 ml   Filed Weights   10/26/13 0600 10/27/13 1617 10/28/13 0410  Weight: 84.8 kg (186 lb 15.2 oz) 85.73 kg (189 lb) 86 kg (189 lb 9.5 oz)    Exam: General: Alert and awake, oriented x3, not in any acute distress. HEENT: anicteric sclera, pupils reactive to light and accommodation, EOMI CVS: S1-S2 clear, no murmur rubs or gallops Chest: clear to auscultation bilaterally, no wheezing, rales or rhonchi Abdomen: soft nontender, nondistended, normal bowel sounds, no organomegaly Extremities: no cyanosis, clubbing or edema noted bilaterally Neuro: Cranial nerves II-XII intact, no focal neurological deficits  Data Reviewed: Basic Metabolic Panel:  Recent Labs Lab 10/25/13 2224 10/27/13 0450 10/28/13 0533  NA 127* 136* 140  K 4.6 4.4 5.0  CL 90* 99 104  CO2 26 26 26   GLUCOSE 209* 131* 132*  BUN 67* 41* 25*  CREATININE 1.66* 1.32 1.23  CALCIUM 8.6 8.4 8.6   Liver Function Tests:  Recent Labs Lab 10/25/13 2224 10/27/13 0450  AST 39* 39*  ALT 26 27    ALKPHOS 105 85  BILITOT 0.5 0.8  PROT 6.7 5.4*  ALBUMIN 2.6* 2.1*    Recent Labs Lab 10/25/13 2224  LIPASE 226*   No results found for this basename: AMMONIA,  in the last 168 hours CBC:  Recent Labs Lab 10/25/13 2224 10/27/13 0450 10/28/13 0533  WBC 28.9* 23.3* 16.2*  NEUTROABS 26.8*  --   --   HGB 11.0* 9.5* 9.5*  HCT 31.9* 27.4* 28.3*  MCV 83.7 83.8 85.0  PLT 338 305 327   Cardiac Enzymes: No results found for this basename: CKTOTAL, CKMB, CKMBINDEX, TROPONINI,  in the last 168 hours BNP (last 3 results) No results found for this basename: PROBNP,  in the last 8760 hours CBG:  Recent Labs Lab 10/27/13 1600 10/27/13 2011 10/28/13 0033 10/28/13 0404 10/28/13 0818  GLUCAP 122* 127* 165* 129* 132*    Micro Recent Results (from the past 240 hour(s))  BODY FLUID CULTURE     Status: None   Collection Time    10/27/13  6:49 PM      Result Value Ref Range Status   Specimen Description BILE FLUID GALL BLADDER   Final   Special Requests NONE   Final   Gram Stain     Final   Value: NO WBC SEEN     NO ORGANISMS SEEN     Performed at [facility]   Culture PENDING   Incomplete   Report Status PENDING   Incomplete     Studies: Ir Perc Cholecystostomy  10/28/2013   CLINICAL DATA:  68 year old male with acute cholecystitis and a loculated subcapsular perihepatic fluid collection concerning for biloma versus abscess. He presented super therapeutic on Coumadin with an INR above 7. INR has now decreased to 1.5 and patient is a candidate for percutaneous drainage.  EXAM: CHOLECYSTOSTOMY; IR ULTRASOUND GUIDANCE; IR IMAGE GUIDED DRAINAGE PERCUT CATH PERITONEAL RETROPERIT  Date: 10/28/2013  PROCEDURE: 1. Ultrasound-guided transhepatic puncture of the gallbladder 2. Placement of a 10 French percutaneous cholecystostomy tube under fluoroscopic guidance 3. Ultrasound-guided aspiration of subcapsular perihepatic fluid 4. Placement of a 10 French percutaneous drain under  fluoroscopic guidance into the subcapsular perihepatic fluid collection Interventional Radiologist:  Sterling Big, MD  ANESTHESIA/SEDATION: Moderate (conscious) sedation was used. Two mg Versed, 62.5 mcg Fentanyl were administered intravenously. The patient's vital signs were monitored continuously by radiology nursing throughout the procedure.  Sedation Time: 30 minutes  FLUOROSCOPY TIME:  2 min 6 seconds  CONTRAST:  15mL OMNIPAQUE IOHEXOL 300 MG/ML  SOLN  TECHNIQUE: Informed consent was obtained from the patient following explanation of the procedure, risks, benefits and alternatives. The patient understands, agrees and consents for the procedure. All questions were addressed. A time out was performed.  Maximal barrier sterile technique utilized including caps, mask, sterile gowns, sterile gloves, large sterile drape, hand hygiene, and Betadine skin prep.  The right upper quadrant was interrogated with ultrasound. The gallbladder is distended, thick walled and contains multiple gallstones consistent with acute cholecystitis. Additionally, there is a loculated fluid collection in the subcapsular space inferior to the left hepatic lobe. No direct communication is identified between the gallbladder in the loculated subhepatic fluid collection.  Attention was first turned to the gallbladder. Using  a right lateral intercostal approach, local anesthesia was attained by infiltration with 1% lidocaine and a small dermatotomy was made. Under real-time sonographic guidance, a 21 gauge Accustick needle was advanced along a transhepatic course and into the gallbladder lumen. The Accustick sheath was advanced over the micro wire into the gallbladder lumen. A gentle hand injection of contrast material confirmed location within the gallbladder. An Amplatz wire was then coiled within the gallbladder lumen, the tract dilated and a Cook 10 [place] all-purpose drain advanced over the wire and formed with the locking pigtail in  the gallbladder lumen. Approximately 60 mL of thick, black bile was aspirated. A sample was sent for culture. The catheter was flushed, secured to the skin with 0-Prolene suture and connected to gravity bag for drainage.  Attention was next turned to the subcapsular perihepatic fluid collection. Using a subcostal right upper quadrant approach, the skin was again anesthetized with 1% lidocaine a small dermatotomy made. Under real-time sonographic guidance, an 18 gauge trocar needle was advanced into the subcapsular fluid collection. Aspiration confirmed thick black bile consistent with biloma. Therefore, an Amplatz wire was advanced in the collection, the tract dilated and a Adriana Simas 10 [place] all-purpose drain formed within the fluid collection. A gentle hand injection of contrast material through the tube confirmed dislocation fluoroscopically. Approximately 45 mL of thick black bile was aspirated. The catheter was flushed, secured to the skin with 0 Prolene suture and connected to gravity bag for drainage.  COMPLICATIONS: None immediate.  IMPRESSION: 1. Successful placement of a 10 French transhepatic percutaneous cholecystostomy tube. 60 mL of thick black bile was aspirated. A sample was sent for culture. The tube is left to gravity bag drainage. 2. Ultrasound-guided aspiration of the loculated subcapsular perihepatic fluid collection yielded thick, black bile consistent with a biloma. Therefore, a second 10 French drain was placed within this collection and approximately 45 mL of bile was aspirated. This drain was also left to gravity bag drainage. Plan: The subcostal right upper quadrant drain within in the subcapsular perihepatic fluid collection may be ready for removal in several days once output has become minimal. The right lateral intercostal percutaneous cholecystostomy tube will need to stay in place for a minimum of 4-6 weeks. Ideally, if the patient becomes a surgical candidate the tube can be removed at  the time of cholecystectomy.  Signed,  Sterling Big, MD  [facility]  [facility] Rad[facility]   Electronically Signed   By: Malachy Moan M.D.   On: 10/28/2013 08:03   Ir Image Guided Drainage Percut Cath  Peritoneal Retroperit  10/28/2013   CLINICAL DATA:  68 year old male with acute cholecystitis and a loculated subcapsular perihepatic fluid collection concerning for biloma versus abscess. He presented super therapeutic on Coumadin with an INR above 7. INR has now decreased to 1.5 and patient is a candidate for percutaneous drainage.  EXAM: CHOLECYSTOSTOMY; IR ULTRASOUND GUIDANCE; IR IMAGE GUIDED DRAINAGE PERCUT CATH PERITONEAL RETROPERIT  Date: 10/28/2013  PROCEDURE: 1. Ultrasound-guided transhepatic puncture of the gallbladder 2. Placement of a 10 French percutaneous cholecystostomy tube under fluoroscopic guidance 3. Ultrasound-guided aspiration of subcapsular perihepatic fluid 4. Placement of a 10 French percutaneous drain under fluoroscopic guidance into the subcapsular perihepatic fluid collection Interventional Radiologist:  Sterling Big, MD  ANESTHESIA/SEDATION: Moderate (conscious) sedation was used. Two mg Versed, 62.5 mcg Fentanyl were administered intravenously. The patient's vital signs were monitored continuously by radiology nursing throughout the procedure.  Sedation Time: 30 minutes  FLUOROSCOPY TIME:  2 min 6 seconds  CONTRAST:  43mL OMNIPAQUE IOHEXOL 300 MG/ML  SOLN  TECHNIQUE: Informed consent was obtained from the patient following explanation of the procedure, risks, benefits and alternatives. The patient understands, agrees and consents for the procedure. All questions were addressed. A time out was performed.  Maximal barrier sterile technique utilized including caps, mask, sterile gowns, sterile gloves, large sterile drape, hand hygiene, and Betadine skin prep.  The right upper quadrant was interrogated with ultrasound. The gallbladder  is distended, thick walled and contains multiple gallstones consistent with acute cholecystitis. Additionally, there is a loculated fluid collection in the subcapsular space inferior to the left hepatic lobe. No direct communication is identified between the gallbladder in the loculated subhepatic fluid collection.  Attention was first turned to the gallbladder. Using a right lateral intercostal approach, local anesthesia was attained by infiltration with 1% lidocaine and a small dermatotomy was made. Under real-time sonographic guidance, a 21 gauge Accustick needle was advanced along a transhepatic course and into the gallbladder lumen. The Accustick sheath was advanced over the micro wire into the gallbladder lumen. A gentle hand injection of contrast material confirmed location within the gallbladder. An Amplatz wire was then coiled within the gallbladder lumen, the tract dilated and a Cook 10 [place] all-purpose drain advanced over the wire and formed with the locking pigtail in the gallbladder lumen. Approximately 60 mL of thick, black bile was aspirated. A sample was sent for culture. The catheter was flushed, secured to the skin with 0-Prolene suture and connected to gravity bag for drainage.  Attention was next turned to the subcapsular perihepatic fluid collection. Using a subcostal right upper quadrant approach, the skin was again anesthetized with 1% lidocaine a small dermatotomy made. Under real-time sonographic guidance, an 18 gauge trocar needle was advanced into the subcapsular fluid collection. Aspiration confirmed thick black bile consistent with biloma. Therefore, an Amplatz wire was advanced in the collection, the tract dilated and a Adriana Simas 10 [place] all-purpose drain formed within the fluid collection. A gentle hand injection of contrast material through the tube confirmed dislocation fluoroscopically. Approximately 45 mL of thick black bile was aspirated. The catheter was flushed, secured to the  skin with 0 Prolene suture and connected to gravity bag for drainage.  COMPLICATIONS: None immediate.  IMPRESSION: 1. Successful placement of a 10 French transhepatic percutaneous cholecystostomy tube. 60 mL of thick black bile was aspirated. A sample was sent for culture. The tube is left to gravity bag drainage. 2. Ultrasound-guided aspiration of the loculated subcapsular perihepatic fluid collection yielded thick, black bile consistent with a biloma. Therefore, a second 10 French drain was placed within this collection and approximately 45 mL of bile was aspirated. This drain was also left to gravity bag drainage. Plan: The subcostal right upper quadrant drain within in the subcapsular perihepatic fluid collection may be ready for removal in several days once output has become minimal. The right lateral intercostal percutaneous cholecystostomy tube will need to stay in place for a minimum of 4-6 weeks. Ideally, if the patient becomes a surgical candidate the tube can be removed at the time of cholecystectomy.  Signed,  Sterling Big, MD  [facility]  [facility] Radiology   Electronically Signed   By: Malachy Moan M.D.   On: 10/28/2013 08:03    Scheduled Meds: . antiseptic oral rinse  15 mL Mouth Rinse q12n4p  . chlorhexidine  15 mL Mouth Rinse BID  . insulin aspart  0-9 Units  Subcutaneous 6 times per day  . piperacillin-tazobactam (ZOSYN)  IV  3.375 g Intravenous Q8H  . vancomycin  750 mg Intravenous Q12H   Continuous Infusions: . dextrose 5 % and 0.9% NaCl 50 mL/hr (10/27/13 1852)       Time spent: 35 minutes    Emrey Thornley [facility]  [facility] Pager [phone] If 7PM-7AM, please contact night-coverage at [email or web address], password [facility] 10/28/2013, 11:35 AM  LOS: 2 days

## 2013-10-28 NOTE — Progress Notes (Addendum)
ANTIBIOTIC CONSULT NOTE - FOLLOW UP  Pharmacy Consult for vancomycin; Zosyn Indication: rule out sepsis and HCAP and acute cholecystitis  Allergies  Allergen Reactions  . Statins Other (See Comments)    "hurt all over"  . Fenofibrate Rash  . Penicillins Rash  . Sulfonamide Derivatives Rash    Patient Measurements: Height: 6' 3.98" (193 cm) Weight: 189 lb 9.5 oz (86 kg) IBW/kg (Calculated) : 86.76 Vital Signs: Temp: 97.7 F (36.5 C) (05/14 1444) Temp src: Oral (05/14 1444) BP: 102/59 mmHg (05/14 1444) Pulse Rate: 70 (05/14 1444) Intake/Output from previous day: 05/13 0701 - 05/14 0700 In: 825 [I.V.:825] Out: 160 [Drains:160] Labs:  Recent Labs  10/25/13 2224 10/27/13 0450 10/28/13 0533  WBC 28.9* 23.3* 16.2*  HGB 11.0* 9.5* 9.5*  PLT 338 305 327  CREATININE 1.66* 1.32 1.23   Estimated Creatinine Clearance: 69.9 ml/min (by C-G formula based on Cr of 1.23).  Recent Labs  10/28/13 1924  VANCOTROUGH 12.0    Anti-infectives   Start     Dose/Rate Route Frequency Ordered Stop   10/26/13 2000  vancomycin (VANCOCIN) IVPB 750 mg/150 ml premix     750 mg 150 mL/hr over 60 Minutes Intravenous Every 12 hours 10/26/13 0709     10/26/13 0715  vancomycin (VANCOCIN) IVPB 1000 mg/200 mL premix     1,000 mg 200 mL/hr over 60 Minutes Intravenous  Once 10/26/13 0709 10/26/13 1137   10/26/13 0715  piperacillin-tazobactam (ZOSYN) IVPB 3.375 g     3.375 g 12.5 mL/hr over 240 Minutes Intravenous Every 8 hours 10/26/13 0709     10/26/13 0315  piperacillin-tazobactam (ZOSYN) IVPB 3.375 g     3.375 g 12.5 mL/hr over 240 Minutes Intravenous  Once 10/26/13 0303 10/26/13 0837     Assessment: 68 YOM on vancomycin and Zosyn for rule out sepsis with recent PNA c/o HCAP and acute cholecystitis. Patient now afebrile and WBC is trending down. SCr is elevated but improving at 1.23 with estimated CrCl ~65-62mL/min. UOP documentation is not complete.   Vancomycin steady state trough = 12 on  750mg  IV every 12 hours.  Culture review:  MRSA PCR negative. Body Fluid culture- no growth to date.   Goal of Therapy:  Vancomycin trough level 15-20 mcg/ml  Plan:  1. Increase vancomycin to 1g IV q12h. No change to Zosyn.  2. Monitor renal function and adjust therapy as needed.  Link Snuffer, PharmD, BCPS Clinical Pharmacist [phone] 10/28/2013,8:27 PM

## 2013-10-28 NOTE — Progress Notes (Signed)
Patient ID: Spencer Shelton Sr., male   DOB: 11/26/1945, 68 y.o.   MRN: [id number] [facility] Gastroenterology Progress Note  Spencer Shelton Sr. 68 y.o. [birth year]   Subjective: Resting. Feels "sore." S/P cholecystostomy tube and also drain to perihepatic fluid collection. Denies N/V.  Objective: Vital signs in last 24 hours: Filed Vitals:   10/28/13 0410  BP: 110/68  Pulse: 69  Temp: 98.6 F (37 C)  Resp: 16    Physical Exam: Gen: alert, no acute distress Abd: 2 drains noted (RUQ and epigastric); diffuse tenderness (less tender), soft, nondistended  Lab Results:  Recent Labs  10/27/13 0450 10/28/13 0533  NA 136* 140  K 4.4 5.0  CL 99 104  CO2 26 26  GLUCOSE 131* 132*  BUN 41* 25*  CREATININE 1.32 1.23  CALCIUM 8.4 8.6    Recent Labs  10/25/13 2224 10/27/13 0450  AST 39* 39*  ALT 26 27  ALKPHOS 105 85  BILITOT 0.5 0.8  PROT 6.7 5.4*  ALBUMIN 2.6* 2.1*    Recent Labs  10/25/13 2224 10/27/13 0450 10/28/13 0533  WBC 28.9* 23.3* 16.2*  NEUTROABS 26.8*  --   --   HGB 11.0* 9.5* 9.5*  HCT 31.9* 27.4* 28.3*  MCV 83.7 83.8 85.0  PLT 338 305 327    Recent Labs  10/27/13 0450 10/27/13 1130  LABPROT 20.0* 17.8*  INR 1.76* 1.51*      Assessment/Plan: 68 yo with acute cholecystitis and gallstone pancreatitis s/p perc drains X 2. On Vanco/Zosyn. WBC trending down. NPO. Supportive care. He is worried about all of his heart meds being stopped on admit, but he knows that his cardiologists are managing that.   Spencer Shelton 10/28/2013, 8:07 AM

## 2013-10-28 NOTE — Progress Notes (Signed)
Subjective: Pt feeling better today. Has been on reg diet  Objective: Physical Exam: BP 110/68  Pulse 69  Temp(Src) 98.6 F (37 C) (Oral)  Resp 16  Ht 6' 3.98" (1.93 m)  Wt 189 lb 9.5 oz (86 kg)  BMI 23.09 kg/m2  SpO2 95% RUQ(chole) drain intact, site clean, NT Output dark bile, sl blood tinged Epigastric(perihepatic) drain intact, site clean, NT Output thin dark bile, scant output since placement    Labs: CBC  Recent Labs  10/27/13 0450 10/28/13 0533  WBC 23.3* 16.2*  HGB 9.5* 9.5*  HCT 27.4* 28.3*  PLT 305 327   BMET  Recent Labs  10/27/13 0450 10/28/13 0533  NA 136* 140  K 4.4 5.0  CL 99 104  CO2 26 26  GLUCOSE 131* 132*  BUN 41* 25*  CREATININE 1.32 1.23  CALCIUM 8.4 8.6   LFT  Recent Labs  10/25/13 2224 10/27/13 0450  PROT 6.7 5.4*  ALBUMIN 2.6* 2.1*  AST 39* 39*  ALT 26 27  ALKPHOS 105 85  BILITOT 0.5 0.8  LIPASE 226*  --    PT/INR  Recent Labs  10/27/13 0450 10/27/13 1130  LABPROT 20.0* 17.8*  INR 1.76* 1.51*     Studies/Results: Ir Perc Cholecystostomy  10/28/2013   CLINICAL DATA:  68 year old male with acute cholecystitis and a loculated subcapsular perihepatic fluid collection concerning for biloma versus abscess. He presented super therapeutic on Coumadin with an INR above 7. INR has now decreased to 1.5 and patient is a candidate for percutaneous drainage.  EXAM: CHOLECYSTOSTOMY; IR ULTRASOUND GUIDANCE; IR IMAGE GUIDED DRAINAGE PERCUT CATH PERITONEAL RETROPERIT  Date: 10/28/2013  PROCEDURE: 1. Ultrasound-guided transhepatic puncture of the gallbladder 2. Placement of a 10 French percutaneous cholecystostomy tube under fluoroscopic guidance 3. Ultrasound-guided aspiration of subcapsular perihepatic fluid 4. Placement of a 10 French percutaneous drain under fluoroscopic guidance into the subcapsular perihepatic fluid collection Interventional Radiologist:  Sterling BigHeath K. McCullough, MD  ANESTHESIA/SEDATION: Moderate (conscious) sedation  was used. Two mg Versed, 62.5 mcg Fentanyl were administered intravenously. The patient's vital signs were monitored continuously by radiology nursing throughout the procedure.  Sedation Time: 30 minutes  FLUOROSCOPY TIME:  2 min 6 seconds  CONTRAST:  15mL OMNIPAQUE IOHEXOL 300 MG/ML  SOLN  TECHNIQUE: Informed consent was obtained from the patient following explanation of the procedure, risks, benefits and alternatives. The patient understands, agrees and consents for the procedure. All questions were addressed. A time out was performed.  Maximal barrier sterile technique utilized including caps, mask, sterile gowns, sterile gloves, large sterile drape, hand hygiene, and Betadine skin prep.  The right upper quadrant was interrogated with ultrasound. The gallbladder is distended, thick walled and contains multiple gallstones consistent with acute cholecystitis. Additionally, there is a loculated fluid collection in the subcapsular space inferior to the left hepatic lobe. No direct communication is identified between the gallbladder in the loculated subhepatic fluid collection.  Attention was first turned to the gallbladder. Using a right lateral intercostal approach, local anesthesia was attained by infiltration with 1% lidocaine and a small dermatotomy was made. Under real-time sonographic guidance, a 21 gauge Accustick needle was advanced along a transhepatic course and into the gallbladder lumen. The Accustick sheath was advanced over the micro wire into the gallbladder lumen. A gentle hand injection of contrast material confirmed location within the gallbladder. An Amplatz wire was then coiled within the gallbladder lumen, the tract dilated and a Cook 10 [place] all-purpose drain advanced over the wire and formed with  the locking pigtail in the gallbladder lumen. Approximately 60 mL of thick, black bile was aspirated. A sample was sent for culture. The catheter was flushed, secured to the skin with 0-Prolene  suture and connected to gravity bag for drainage.  Attention was next turned to the subcapsular perihepatic fluid collection. Using a subcostal right upper quadrant approach, the skin was again anesthetized with 1% lidocaine a small dermatotomy made. Under real-time sonographic guidance, an 18 gauge trocar needle was advanced into the subcapsular fluid collection. Aspiration confirmed thick black bile consistent with biloma. Therefore, an Amplatz wire was advanced in the collection, the tract dilated and a Adriana Simas 10 [place] all-purpose drain formed within the fluid collection. A gentle hand injection of contrast material through the tube confirmed dislocation fluoroscopically. Approximately 45 mL of thick black bile was aspirated. The catheter was flushed, secured to the skin with 0 Prolene suture and connected to gravity bag for drainage.  COMPLICATIONS: None immediate.  IMPRESSION: 1. Successful placement of a 10 French transhepatic percutaneous cholecystostomy tube. 60 mL of thick black bile was aspirated. A sample was sent for culture. The tube is left to gravity bag drainage. 2. Ultrasound-guided aspiration of the loculated subcapsular perihepatic fluid collection yielded thick, black bile consistent with a biloma. Therefore, a second 10 French drain was placed within this collection and approximately 45 mL of bile was aspirated. This drain was also left to gravity bag drainage. Plan: The subcostal right upper quadrant drain within in the subcapsular perihepatic fluid collection may be ready for removal in several days once output has become minimal. The right lateral intercostal percutaneous cholecystostomy tube will need to stay in place for a minimum of 4-6 weeks. Ideally, if the patient becomes a surgical candidate the tube can be removed at the time of cholecystectomy.  Signed,  Sterling Big, MD  [facility]  [facility]   Electronically Signed   By: Malachy Moan M.D.   On: 10/28/2013 08:03   Ir Image Guided Drainage Percut Cath  Peritoneal Retroperit  10/28/2013   CLINICAL DATA:  68 year old male with acute cholecystitis and a loculated subcapsular perihepatic fluid collection concerning for biloma versus abscess. He presented super therapeutic on Coumadin with an INR above 7. INR has now decreased to 1.5 and patient is a candidate for percutaneous drainage.  EXAM: CHOLECYSTOSTOMY; IR ULTRASOUND GUIDANCE; IR IMAGE GUIDED DRAINAGE PERCUT CATH PERITONEAL RETROPERIT  Date: 10/28/2013  PROCEDURE: 1. Ultrasound-guided transhepatic puncture of the gallbladder 2. Placement of a 10 French percutaneous cholecystostomy tube under fluoroscopic guidance 3. Ultrasound-guided aspiration of subcapsular perihepatic fluid 4. Placement of a 10 French percutaneous drain under fluoroscopic guidance into the subcapsular perihepatic fluid collection Interventional Radiologist:  Sterling Big, MD  ANESTHESIA/SEDATION: Moderate (conscious) sedation was used. Two mg Versed, 62.5 mcg Fentanyl were administered intravenously. The patient's vital signs were monitored continuously by radiology nursing throughout the procedure.  Sedation Time: 30 minutes  FLUOROSCOPY TIME:  2 min 6 seconds  CONTRAST:  15mL OMNIPAQUE IOHEXOL 300 MG/ML  SOLN  TECHNIQUE: Informed consent was obtained from the patient following explanation of the procedure, risks, benefits and alternatives. The patient understands, agrees and consents for the procedure. All questions were addressed. A time out was performed.  Maximal barrier sterile technique utilized including caps, mask, sterile gowns, sterile gloves, large sterile drape, hand hygiene, and Betadine skin prep.  The right upper quadrant was interrogated with ultrasound. The gallbladder is distended, thick walled and contains multiple gallstones  consistent with acute cholecystitis. Additionally, there is a loculated fluid collection in the subcapsular  space inferior to the left hepatic lobe. No direct communication is identified between the gallbladder in the loculated subhepatic fluid collection.  Attention was first turned to the gallbladder. Using a right lateral intercostal approach, local anesthesia was attained by infiltration with 1% lidocaine and a small dermatotomy was made. Under real-time sonographic guidance, a 21 gauge Accustick needle was advanced along a transhepatic course and into the gallbladder lumen. The Accustick sheath was advanced over the micro wire into the gallbladder lumen. A gentle hand injection of contrast material confirmed location within the gallbladder. An Amplatz wire was then coiled within the gallbladder lumen, the tract dilated and a Cook 10 [place] all-purpose drain advanced over the wire and formed with the locking pigtail in the gallbladder lumen. Approximately 60 mL of thick, black bile was aspirated. A sample was sent for culture. The catheter was flushed, secured to the skin with 0-Prolene suture and connected to gravity bag for drainage.  Attention was next turned to the subcapsular perihepatic fluid collection. Using a subcostal right upper quadrant approach, the skin was again anesthetized with 1% lidocaine a small dermatotomy made. Under real-time sonographic guidance, an 18 gauge trocar needle was advanced into the subcapsular fluid collection. Aspiration confirmed thick black bile consistent with biloma. Therefore, an Amplatz wire was advanced in the collection, the tract dilated and a Adriana Simas 10 [place] all-purpose drain formed within the fluid collection. A gentle hand injection of contrast material through the tube confirmed dislocation fluoroscopically. Approximately 45 mL of thick black bile was aspirated. The catheter was flushed, secured to the skin with 0 Prolene suture and connected to gravity bag for drainage.  COMPLICATIONS: None immediate.  IMPRESSION: 1. Successful placement of a 10 French transhepatic  percutaneous cholecystostomy tube. 60 mL of thick black bile was aspirated. A sample was sent for culture. The tube is left to gravity bag drainage. 2. Ultrasound-guided aspiration of the loculated subcapsular perihepatic fluid collection yielded thick, black bile consistent with a biloma. Therefore, a second 10 French drain was placed within this collection and approximately 45 mL of bile was aspirated. This drain was also left to gravity bag drainage. Plan: The subcostal right upper quadrant drain within in the subcapsular perihepatic fluid collection may be ready for removal in several days once output has become minimal. The right lateral intercostal percutaneous cholecystostomy tube will need to stay in place for a minimum of 4-6 weeks. Ideally, if the patient becomes a surgical candidate the tube can be removed at the time of cholecystectomy.  Signed,  Sterling Big, MD  [facility]  [facility]   Electronically Signed   By: Malachy Moan M.D.   On: 10/28/2013 08:03    Assessment/Plan: Acute on chronic cholecystitis with associated perihepatic collection as well S/p perc chole drain and perihepatic drain placements 5/13 WBC down Cont drain flushes/care. Will likely be able to remove perihepatic drain prior to DC    LOS: 2 days    Brayton El PA-C 10/28/2013 10:49 AM

## 2013-10-28 NOTE — Progress Notes (Signed)
Subjective: Pt feels much better.  No N/V.  Abdomen hurts where the drains were placed.  No BM since 10/25/13.  Tolerating diet.  Urinating well.  Drains put out 17160mL/24 hour.  Feels overall less sick.  Objective: Vital signs in last 24 hours: Temp:  [97.8 F (36.6 C)-98.6 F (37 C)] 98.6 F (37 C) (05/14 0410) Pulse Rate:  [69-72] 69 (05/14 0410) Resp:  [12-26] 16 (05/14 0410) BP: (101-126)/(45-68) 110/68 mmHg (05/14 0410) SpO2:  [95 %-99 %] 95 % (05/14 0410) Weight:  [189 lb (85.73 kg)-189 lb 9.5 oz (86 kg)] 189 lb 9.5 oz (86 kg) (05/14 0410) Last BM Date: 10/25/13  Intake/Output from previous day: 05/13 0701 - 05/14 0700 In: 825 [I.V.:825] Out: 160 [Drains:160] Intake/Output this shift:    PE: Gen:  Alert, NAD, pleasant Card:  RRR, no M/G/R heard Pulm:  CTA, no W/R/R Abd: Soft, moderate tenderness at drain sites, +BS, no HSM, epigastric drain #1 (subcapsular liver) with 100mL (+another 100mL in bag currently) dark black/brown bilious output, RUQ drain #2 with 50mL dark brown output   Lab Results:   Recent Labs  10/27/13 0450 10/28/13 0533  WBC 23.3* 16.2*  HGB 9.5* 9.5*  HCT 27.4* 28.3*  PLT 305 327   BMET  Recent Labs  10/27/13 0450 10/28/13 0533  NA 136* 140  K 4.4 5.0  CL 99 104  CO2 26 26  GLUCOSE 131* 132*  BUN 41* 25*  CREATININE 1.32 1.23  CALCIUM 8.4 8.6   PT/INR  Recent Labs  10/27/13 0450 10/27/13 1130  LABPROT 20.0* 17.8*  INR 1.76* 1.51*   CMP     Component Value Date/Time   NA 140 10/28/2013 0533   K 5.0 10/28/2013 0533   CL 104 10/28/2013 0533   CO2 26 10/28/2013 0533   GLUCOSE 132* 10/28/2013 0533   BUN 25* 10/28/2013 0533   CREATININE 1.23 10/28/2013 0533   CALCIUM 8.6 10/28/2013 0533   PROT 5.4* 10/27/2013 0450   ALBUMIN 2.1* 10/27/2013 0450   AST 39* 10/27/2013 0450   ALT 27 10/27/2013 0450   ALKPHOS 85 10/27/2013 0450   BILITOT 0.8 10/27/2013 0450   GFRNONAA 59* 10/28/2013 0533   GFRAA 68* 10/28/2013 0533   Lipase      Component Value Date/Time   LIPASE 226* 10/25/2013 2224       Studies/Results: Ir Perc Cholecystostomy  10/28/2013   CLINICAL DATA:  68 year old male with acute cholecystitis and a loculated subcapsular perihepatic fluid collection concerning for biloma versus abscess. He presented super therapeutic on Coumadin with an INR above 7. INR has now decreased to 1.5 and patient is a candidate for percutaneous drainage.  EXAM: CHOLECYSTOSTOMY; IR ULTRASOUND GUIDANCE; IR IMAGE GUIDED DRAINAGE PERCUT CATH PERITONEAL RETROPERIT  Date: 10/28/2013  PROCEDURE: 1. Ultrasound-guided transhepatic puncture of the gallbladder 2. Placement of a 10 French percutaneous cholecystostomy tube under fluoroscopic guidance 3. Ultrasound-guided aspiration of subcapsular perihepatic fluid 4. Placement of a 10 French percutaneous drain under fluoroscopic guidance into the subcapsular perihepatic fluid collection Interventional Radiologist:  Sterling BigHeath K. McCullough, MD  ANESTHESIA/SEDATION: Moderate (conscious) sedation was used. Two mg Versed, 62.5 mcg Fentanyl were administered intravenously. The patient's vital signs were monitored continuously by radiology nursing throughout the procedure.  Sedation Time: 30 minutes  FLUOROSCOPY TIME:  2 min 6 seconds  CONTRAST:  15mL OMNIPAQUE IOHEXOL 300 MG/ML  SOLN  TECHNIQUE: Informed consent was obtained from the patient following explanation of the procedure, risks, benefits and alternatives. The patient understands,  agrees and consents for the procedure. All questions were addressed. A time out was performed.  Maximal barrier sterile technique utilized including caps, mask, sterile gowns, sterile gloves, large sterile drape, hand hygiene, and Betadine skin prep.  The right upper quadrant was interrogated with ultrasound. The gallbladder is distended, thick walled and contains multiple gallstones consistent with acute cholecystitis. Additionally, there is a loculated fluid collection in the  subcapsular space inferior to the left hepatic lobe. No direct communication is identified between the gallbladder in the loculated subhepatic fluid collection.  Attention was first turned to the gallbladder. Using a right lateral intercostal approach, local anesthesia was attained by infiltration with 1% lidocaine and a small dermatotomy was made. Under real-time sonographic guidance, a 21 gauge Accustick needle was advanced along a transhepatic course and into the gallbladder lumen. The Accustick sheath was advanced over the micro wire into the gallbladder lumen. A gentle hand injection of contrast material confirmed location within the gallbladder. An Amplatz wire was then coiled within the gallbladder lumen, the tract dilated and a Cook 10 [place] all-purpose drain advanced over the wire and formed with the locking pigtail in the gallbladder lumen. Approximately 60 mL of thick, black bile was aspirated. A sample was sent for culture. The catheter was flushed, secured to the skin with 0-Prolene suture and connected to gravity bag for drainage.  Attention was next turned to the subcapsular perihepatic fluid collection. Using a subcostal right upper quadrant approach, the skin was again anesthetized with 1% lidocaine a small dermatotomy made. Under real-time sonographic guidance, an 18 gauge trocar needle was advanced into the subcapsular fluid collection. Aspiration confirmed thick black bile consistent with biloma. Therefore, an Amplatz wire was advanced in the collection, the tract dilated and a Adriana Simas 10 [place] all-purpose drain formed within the fluid collection. A gentle hand injection of contrast material through the tube confirmed dislocation fluoroscopically. Approximately 45 mL of thick black bile was aspirated. The catheter was flushed, secured to the skin with 0 Prolene suture and connected to gravity bag for drainage.  COMPLICATIONS: None immediate.  IMPRESSION: 1. Successful placement of a 10 French  transhepatic percutaneous cholecystostomy tube. 60 mL of thick black bile was aspirated. A sample was sent for culture. The tube is left to gravity bag drainage. 2. Ultrasound-guided aspiration of the loculated subcapsular perihepatic fluid collection yielded thick, black bile consistent with a biloma. Therefore, a second 10 French drain was placed within this collection and approximately 45 mL of bile was aspirated. This drain was also left to gravity bag drainage. Plan: The subcostal right upper quadrant drain within in the subcapsular perihepatic fluid collection may be ready for removal in several days once output has become minimal. The right lateral intercostal percutaneous cholecystostomy tube will need to stay in place for a minimum of 4-6 weeks. Ideally, if the patient becomes a surgical candidate the tube can be removed at the time of cholecystectomy.  Signed,  Sterling Big, MD  [facility]  [facility]   Electronically Signed   By: Malachy Moan M.D.   On: 10/28/2013 08:03   Ir US Guide Bx Asp/drain  10/28/2013   CLINICAL DATA:  68 year old male with acute cholecystitis and a loculated subcapsular perihepatic fluid collection concerning for biloma versus abscess. He presented super therapeutic on Coumadin with an INR above 7. INR has now decreased to 1.5 and patient is a candidate for percutaneous drainage.  EXAM: CHOLECYSTOSTOMY; IR ULTRASOUND GUIDANCE; IR IMAGE GUIDED DRAINAGE  PERCUT CATH PERITONEAL RETROPERIT  Date: 10/28/2013  PROCEDURE: 1. Ultrasound-guided transhepatic puncture of the gallbladder 2. Placement of a 10 French percutaneous cholecystostomy tube under fluoroscopic guidance 3. Ultrasound-guided aspiration of subcapsular perihepatic fluid 4. Placement of a 10 French percutaneous drain under fluoroscopic guidance into the subcapsular perihepatic fluid collection Interventional Radiologist:  Sterling Big, MD   ANESTHESIA/SEDATION: Moderate (conscious) sedation was used. Two mg Versed, 62.5 mcg Fentanyl were administered intravenously. The patient's vital signs were monitored continuously by radiology nursing throughout the procedure.  Sedation Time: 30 minutes  FLUOROSCOPY TIME:  2 min 6 seconds  CONTRAST:  52mL OMNIPAQUE IOHEXOL 300 MG/ML  SOLN  TECHNIQUE: Informed consent was obtained from the patient following explanation of the procedure, risks, benefits and alternatives. The patient understands, agrees and consents for the procedure. All questions were addressed. A time out was performed.  Maximal barrier sterile technique utilized including caps, mask, sterile gowns, sterile gloves, large sterile drape, hand hygiene, and Betadine skin prep.  The right upper quadrant was interrogated with ultrasound. The gallbladder is distended, thick walled and contains multiple gallstones consistent with acute cholecystitis. Additionally, there is a loculated fluid collection in the subcapsular space inferior to the left hepatic lobe. No direct communication is identified between the gallbladder in the loculated subhepatic fluid collection.  Attention was first turned to the gallbladder. Using a right lateral intercostal approach, local anesthesia was attained by infiltration with 1% lidocaine and a small dermatotomy was made. Under real-time sonographic guidance, a 21 gauge Accustick needle was advanced along a transhepatic course and into the gallbladder lumen. The Accustick sheath was advanced over the micro wire into the gallbladder lumen. A gentle hand injection of contrast material confirmed location within the gallbladder. An Amplatz wire was then coiled within the gallbladder lumen, the tract dilated and a Cook 10 [place] all-purpose drain advanced over the wire and formed with the locking pigtail in the gallbladder lumen. Approximately 60 mL of thick, black bile was aspirated. A sample was sent for culture. The catheter  was flushed, secured to the skin with 0-Prolene suture and connected to gravity bag for drainage.  Attention was next turned to the subcapsular perihepatic fluid collection. Using a subcostal right upper quadrant approach, the skin was again anesthetized with 1% lidocaine a small dermatotomy made. Under real-time sonographic guidance, an 18 gauge trocar needle was advanced into the subcapsular fluid collection. Aspiration confirmed thick black bile consistent with biloma. Therefore, an Amplatz wire was advanced in the collection, the tract dilated and a Adriana Simas 10 [place] all-purpose drain formed within the fluid collection. A gentle hand injection of contrast material through the tube confirmed dislocation fluoroscopically. Approximately 45 mL of thick black bile was aspirated. The catheter was flushed, secured to the skin with 0 Prolene suture and connected to gravity bag for drainage.  COMPLICATIONS: None immediate.  IMPRESSION: 1. Successful placement of a 10 French transhepatic percutaneous cholecystostomy tube. 60 mL of thick black bile was aspirated. A sample was sent for culture. The tube is left to gravity bag drainage. 2. Ultrasound-guided aspiration of the loculated subcapsular perihepatic fluid collection yielded thick, black bile consistent with a biloma. Therefore, a second 10 French drain was placed within this collection and approximately 45 mL of bile was aspirated. This drain was also left to gravity bag drainage. Plan: The subcostal right upper quadrant drain within in the subcapsular perihepatic fluid collection may be ready for removal in several days once output has become minimal. The right lateral  intercostal percutaneous cholecystostomy tube will need to stay in place for a minimum of 4-6 weeks. Ideally, if the patient becomes a surgical candidate the tube can be removed at the time of cholecystectomy.  Signed,  Sterling Big, MD  [facility]   [facility]   Electronically Signed   By: Malachy Moan M.D.   On: 10/28/2013 08:03   Ir Image Guided Drainage Percut Cath  Peritoneal Retroperit  10/28/2013   CLINICAL DATA:  68 year old male with acute cholecystitis and a loculated subcapsular perihepatic fluid collection concerning for biloma versus abscess. He presented super therapeutic on Coumadin with an INR above 7. INR has now decreased to 1.5 and patient is a candidate for percutaneous drainage.  EXAM: CHOLECYSTOSTOMY; IR ULTRASOUND GUIDANCE; IR IMAGE GUIDED DRAINAGE PERCUT CATH PERITONEAL RETROPERIT  Date: 10/28/2013  PROCEDURE: 1. Ultrasound-guided transhepatic puncture of the gallbladder 2. Placement of a 10 French percutaneous cholecystostomy tube under fluoroscopic guidance 3. Ultrasound-guided aspiration of subcapsular perihepatic fluid 4. Placement of a 10 French percutaneous drain under fluoroscopic guidance into the subcapsular perihepatic fluid collection Interventional Radiologist:  Sterling Big, MD  ANESTHESIA/SEDATION: Moderate (conscious) sedation was used. Two mg Versed, 62.5 mcg Fentanyl were administered intravenously. The patient's vital signs were monitored continuously by radiology nursing throughout the procedure.  Sedation Time: 30 minutes  FLUOROSCOPY TIME:  2 min 6 seconds  CONTRAST:  15mL OMNIPAQUE IOHEXOL 300 MG/ML  SOLN  TECHNIQUE: Informed consent was obtained from the patient following explanation of the procedure, risks, benefits and alternatives. The patient understands, agrees and consents for the procedure. All questions were addressed. A time out was performed.  Maximal barrier sterile technique utilized including caps, mask, sterile gowns, sterile gloves, large sterile drape, hand hygiene, and Betadine skin prep.  The right upper quadrant was interrogated with ultrasound. The gallbladder is distended, thick walled and contains multiple gallstones consistent with acute cholecystitis. Additionally,  there is a loculated fluid collection in the subcapsular space inferior to the left hepatic lobe. No direct communication is identified between the gallbladder in the loculated subhepatic fluid collection.  Attention was first turned to the gallbladder. Using a right lateral intercostal approach, local anesthesia was attained by infiltration with 1% lidocaine and a small dermatotomy was made. Under real-time sonographic guidance, a 21 gauge Accustick needle was advanced along a transhepatic course and into the gallbladder lumen. The Accustick sheath was advanced over the micro wire into the gallbladder lumen. A gentle hand injection of contrast material confirmed location within the gallbladder. An Amplatz wire was then coiled within the gallbladder lumen, the tract dilated and a Cook 10 [place] all-purpose drain advanced over the wire and formed with the locking pigtail in the gallbladder lumen. Approximately 60 mL of thick, black bile was aspirated. A sample was sent for culture. The catheter was flushed, secured to the skin with 0-Prolene suture and connected to gravity bag for drainage.  Attention was next turned to the subcapsular perihepatic fluid collection. Using a subcostal right upper quadrant approach, the skin was again anesthetized with 1% lidocaine a small dermatotomy made. Under real-time sonographic guidance, an 18 gauge trocar needle was advanced into the subcapsular fluid collection. Aspiration confirmed thick black bile consistent with biloma. Therefore, an Amplatz wire was advanced in the collection, the tract dilated and a Adriana Simas 10 [place] all-purpose drain formed within the fluid collection. A gentle hand injection of contrast material through the tube confirmed dislocation fluoroscopically. Approximately 45 mL of thick black bile  was aspirated. The catheter was flushed, secured to the skin with 0 Prolene suture and connected to gravity bag for drainage.  COMPLICATIONS: None immediate.   IMPRESSION: 1. Successful placement of a 10 French transhepatic percutaneous cholecystostomy tube. 60 mL of thick black bile was aspirated. A sample was sent for culture. The tube is left to gravity bag drainage. 2. Ultrasound-guided aspiration of the loculated subcapsular perihepatic fluid collection yielded thick, black bile consistent with a biloma. Therefore, a second 10 French drain was placed within this collection and approximately 45 mL of bile was aspirated. This drain was also left to gravity bag drainage. Plan: The subcostal right upper quadrant drain within in the subcapsular perihepatic fluid collection may be ready for removal in several days once output has become minimal. The right lateral intercostal percutaneous cholecystostomy tube will need to stay in place for a minimum of 4-6 weeks. Ideally, if the patient becomes a surgical candidate the tube can be removed at the time of cholecystectomy.  Signed,  Sterling Big, MD  [facility]  [facility] Radiology   Electronically Signed   By: Malachy Moan M.D.   On: 10/28/2013 08:03    Anti-infectives: Anti-infectives   Start     Dose/Rate Route Frequency Ordered Stop   10/26/13 2000  vancomycin (VANCOCIN) IVPB 750 mg/150 ml premix     750 mg 150 mL/hr over 60 Minutes Intravenous Every 12 hours 10/26/13 0709     10/26/13 0715  vancomycin (VANCOCIN) IVPB 1000 mg/200 mL premix     1,000 mg 200 mL/hr over 60 Minutes Intravenous  Once 10/26/13 0709 10/26/13 1137   10/26/13 0715  piperacillin-tazobactam (ZOSYN) IVPB 3.375 g     3.375 g 12.5 mL/hr over 240 Minutes Intravenous Every 8 hours 10/26/13 0709     10/26/13 0315  piperacillin-tazobactam (ZOSYN) IVPB 3.375 g     3.375 g 12.5 mL/hr over 240 Minutes Intravenous  Once 10/26/13 0303 10/26/13 0837       Assessment/Plan Subacute Cholecystitis ? Perforated  Gallstone pancreatitis Perihepatic fluid collection  Leukocytosis - improved to  16.2  Plan:  1. S/p IR perc chole drain and aspiration/drain placement to subcapsular liver fluid 2. Continue drains for now, will need to keep perc chole drain for at least 6-8 weeks before interval cholecystectomy would be considered.  He may be high risk for open cholecystectomy and would need to be cleared from cardiology prior to proceeding.   3.  Continue antibiotics (Vanc/Zosyn Day #3) 4.  Continue anticoagulation when IR agrees 5.  Ambulate and IS 5.  SCD's         LOS: 2 days    Aris Georgia 10/28/2013, 8:52 AM Pager: [phone]

## 2013-10-28 NOTE — Progress Notes (Signed)
Agree with above, he is better today, cont drains and abx

## 2013-10-29 LAB — GLUCOSE, CAPILLARY
GLUCOSE-CAPILLARY: 188 mg/dL — AB (ref 70–99)
Glucose-Capillary: 114 mg/dL — ABNORMAL HIGH (ref 70–99)
Glucose-Capillary: 142 mg/dL — ABNORMAL HIGH (ref 70–99)
Glucose-Capillary: 152 mg/dL — ABNORMAL HIGH (ref 70–99)

## 2013-10-29 LAB — COMPREHENSIVE METABOLIC PANEL
ALT: 26 U/L (ref 0–53)
AST: 33 U/L (ref 0–37)
Albumin: 2.1 g/dL — ABNORMAL LOW (ref 3.5–5.2)
Alkaline Phosphatase: 75 U/L (ref 39–117)
BUN: 15 mg/dL (ref 6–23)
CALCIUM: 8.9 mg/dL (ref 8.4–10.5)
CHLORIDE: 105 meq/L (ref 96–112)
CO2: 25 mEq/L (ref 19–32)
Creatinine, Ser: 1.09 mg/dL (ref 0.50–1.35)
GFR calc Af Amer: 79 mL/min — ABNORMAL LOW (ref >90)
GFR calc non Af Amer: 68 mL/min — ABNORMAL LOW (ref >90)
Glucose, Bld: 125 mg/dL — ABNORMAL HIGH (ref 70–99)
Potassium: 5 mEq/L (ref 3.7–5.3)
SODIUM: 139 meq/L (ref 137–147)
Total Bilirubin: 0.4 mg/dL (ref 0.3–1.2)
Total Protein: 5.5 g/dL — ABNORMAL LOW (ref 6.0–8.3)

## 2013-10-29 LAB — CBC
HCT: 28.3 % — ABNORMAL LOW (ref 39.0–52.0)
HEMOGLOBIN: 9.5 g/dL — AB (ref 13.0–17.0)
MCH: 28.8 pg (ref 26.0–34.0)
MCHC: 33.6 g/dL (ref 30.0–36.0)
MCV: 85.8 fL (ref 78.0–100.0)
Platelets: 319 10*3/uL (ref 150–400)
RBC: 3.3 MIL/uL — ABNORMAL LOW (ref 4.22–5.81)
RDW: 15.9 % — ABNORMAL HIGH (ref 11.5–15.5)
WBC: 14.8 10*3/uL — ABNORMAL HIGH (ref 4.0–10.5)

## 2013-10-29 LAB — PROTIME-INR
INR: 1.79 — ABNORMAL HIGH (ref 0.00–1.49)
PROTHROMBIN TIME: 20.3 s — AB (ref 11.6–15.2)

## 2013-10-29 MED ORDER — BENAZEPRIL HCL 20 MG PO TABS
20.0000 mg | ORAL_TABLET | Freq: Every day | ORAL | Status: DC
  Administered 2013-10-30 – 2013-10-31 (×2): 20 mg via ORAL
  Filled 2013-10-29 (×3): qty 1

## 2013-10-29 MED ORDER — WARFARIN - PHARMACIST DOSING INPATIENT
Freq: Every day | Status: DC
  Administered 2013-10-30: 18:00:00

## 2013-10-29 MED ORDER — FUROSEMIDE 40 MG PO TABS
40.0000 mg | ORAL_TABLET | Freq: Every day | ORAL | Status: DC
  Administered 2013-10-29 – 2013-10-31 (×3): 40 mg via ORAL
  Filled 2013-10-29 (×3): qty 1

## 2013-10-29 MED ORDER — WARFARIN SODIUM 7.5 MG PO TABS
7.5000 mg | ORAL_TABLET | Freq: Once | ORAL | Status: AC
  Administered 2013-10-29: 7.5 mg via ORAL
  Filled 2013-10-29: qty 1

## 2013-10-29 NOTE — Progress Notes (Signed)
ANTICOAGULATION CONSULT NOTE - Initial Consult  Pharmacy Consult for warfarin Indication: atrial fibrillation  Allergies  Allergen Reactions  . Statins Other (See Comments)    "hurt all over"  . Fenofibrate Rash  . Penicillins Rash  . Sulfonamide Derivatives Rash    Patient Measurements: Height: 6' 3.98" (193 cm) Weight: 190 lb 4.1 oz (86.3 kg) IBW/kg (Calculated) : 86.76 Heparin Dosing Weight:   Vital Signs: Temp: 97.7 F (36.5 C) (05/15 0447) Temp src: Oral (05/15 0447) BP: 105/59 mmHg (05/15 1234) Pulse Rate: 72 (05/15 0447)  Labs:  Recent Labs  10/27/13 0450 10/27/13 1130 10/28/13 0533 10/29/13 0559  HGB 9.5*  --  9.5* 9.5*  HCT 27.4*  --  28.3* 28.3*  PLT 305  --  327 319  LABPROT 20.0* 17.8*  --  20.3*  INR 1.76* 1.51*  --  1.79*  CREATININE 1.32  --  1.23 1.09    Estimated Creatinine Clearance: 79.2 ml/min (by C-G formula based on Cr of 1.09).   Medical History: Past Medical History  Diagnosis Date  . Anxiety   . Complete heart block   . Ventricular tachycardia   . Cardiomyopathy   . Atrial fibrillation   . Kidney stones     "once; passed on it's own" (10/08/2013)  . Hypertension   . High cholesterol   . CHF (congestive heart failure)   . Coronary artery disease     with known occlusions of all of his native coronary  . Anterior myocardial infarction 1982; 1995  . Asthma     "when I was a boy"  . Pneumonia     "couple times" (10/08/2013)  . Type II diabetes mellitus   . Cholecystitis   . 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 ALPHAGAN P 0.1 % SOLN Place 1 drop into both eyes 2 (two) times daily.       [name]. aspirin 81 MG tablet Take 81 mg by mouth daily.        . benazepril (LOTENSIN) 40 MG tablet Take 20 mg by mouth 2 (two) times daily.       . ciprofloxacin (CIPRO) 500 MG tablet Take 500 mg by mouth 2 (two) times  daily.      . Coenzyme Q10 (COQ10) 100 MG CAPS Take 1 capsule by mouth daily.      . Cyanocobalamin (B-12 DOTS) 500 MCG TBDP Take 500 mg by mouth daily.       . digoxin (LANOXIN) 0.25 MG tablet Take 250 mcg by mouth daily.        [name]. ezetimibe (ZETIA) 10 MG tablet Take 10 mg by mouth daily.        . furosemide (LASIX) 40 MG tablet Take 40 mg by mouth 2 (two) times daily.       . metoprolol (LOPRESSOR) 50 MG tablet Take 50 mg by mouth 2 (two) times daily.       . Multiple Vitamins-Minerals (MULTIVITAMIN WITH MINERALS) tablet Take 1 tablet by mouth daily.        . pioglitazone (ACTOS) 15 MG tablet Take 15 mg by mouth daily.      . TRAVATAN Z 0.004 % SOLN ophthalmic solution Place 1 drop into both eyes at bedtime.       . ursodiol (ACTIGALL) 300 MG capsule Take 300 mg by mouth 2 (two) times daily.        .[name]  valsartan (DIOVAN) 160 MG tablet Take 80 mg by mouth 2 (two) times daily.       [name] warfarin (COUMADIN) 5 MG tablet Take 5 mg by mouth daily.        Assessment: 56 yom admitted with acute cholecystitis on chronic coumadin for afib. Now s/p INR reversal with vitamin K for drain placement. To restart coumadin tonight. INR is subtherapeutic as expected at 1.79. H/H is stable and no bleeding noted. MD noted need for brand name only coumadin. This is what the hospital carries so pt will be continued on brand name coumadin.  Goal of Therapy:  INR 2-3   Plan:  1. Coumadin 7.5mg  PO x 1 tonight 2. Daily INR  Spencer Shelton Spencer Shelton 10/29/2013,12:52 PM

## 2013-10-29 NOTE — Progress Notes (Signed)
[facility] PROGRESS NOTE   Spencer Shelton ZOX:[id number] DOB: 05-11-1946 DOA: 10/26/2013 PCP: Pcp Not In System  HPI/Subjective: Percutaneous drain placed by interventional radiology yesterday. Patient seen with wife at bedside, feels much better today, coumadin restarted.  Assessment/Plan: Principal Problem:   Acute cholecystitis Active Problems:   Essential hypertension, benign   Atrial fibrillation   Chronic systolic heart failure   Biventricular implantable cardioverter-defibrillator in situ   Ventricular tachycardia (paroxysmal)   Abdominal pain, acute, right upper quadrant   Cholecystitis   Dehydration   Acute renal failure   PNA (pneumonia)   CAD (coronary artery disease), native coronary artery   Cirrhosis   Sepsis secondary to acute cholecystitis  -Patient had leukocytosis as well as tachypnea tach admission  -Continue IV antibiotics, vancomycin, Zosyn. -Leukocytosis, blood pressure and overall symptoms improving.  Abdominal pain secondary to acute cholecystitis  -Abdominal ultrasound shows cholelithiasis with gallbladder wall thickening, or a lesion of the right lobe of the liver. -Patient has had a CT scan that was done at [facility], [place] which showed hepatic fluid collection  -General surgery consulted, cardiology consulted on admission and cleared him for any upcoming procedure. -Interventional radiology consulted for percutaneous drainage  -Continue IV antibiotics, vancomycin and Zosyn.  Recent pneumonia -Patient treated for pneumonia recently, with Levaquin. -Chest x-ray showed right middle lobe pneumonia. Patient is on Zosyn and vancomycin.  Atrial fibrillation with supratherapeutic INR  -Currently on Coumadin  -Have the ICD implanted  -INR 7.8, patient given vitamin K to reverse.  -Restarted Coumadin today the, cardiology recommended to stop aspirin -Digoxin currently held, patient pending dig level   Chronic systolic heart  failure  -Last documented EF was in 2004, 40%  -Dr. Donnie Aho consulted  -Continue daily weights, monitoring of intake and output   Type 2 diabetes mellitus  -Actos held  -SSI  Hypertension  -Losartan, Lasix, benazepril held  -Currently hypotensive    Code Status: Full code Family Communication: Plan discussed with the patient. Disposition Plan: Remains inpatient   Consultants:  General surgery.  Interventional radiology.  Gastroenterology.  Cardiology.  Procedures:  Percutaneous biliary drain to be placed today  Antibiotics:  Vanc and Zosyn.   Objective: Filed Vitals:   10/29/13 1234  BP: 105/59  Pulse:   Temp:   Resp:     Intake/Output Summary (Last 24 hours) at 10/29/13 1331 Last data filed at 10/29/13 0930  Gross per 24 hour  Intake    240 ml  Output    650 ml  Net   -410 ml   Filed Weights   10/27/13 1617 10/28/13 0410 10/29/13 0447  Weight: 85.73 kg (189 lb) 86 kg (189 lb 9.5 oz) 86.3 kg (190 lb 4.1 oz)    Exam: General: Alert and awake, oriented x3, not in any acute distress. HEENT: anicteric sclera, pupils reactive to light and accommodation, EOMI CVS: S1-S2 clear, no murmur rubs or gallops Chest: clear to auscultation bilaterally, no wheezing, rales or rhonchi Abdomen: soft nontender, nondistended, normal bowel sounds, no organomegaly Extremities: no cyanosis, clubbing or edema noted bilaterally Neuro: Cranial nerves II-XII intact, no focal neurological deficits  Data Reviewed: Basic Metabolic Panel:  Recent Labs Lab 10/25/13 2224 10/27/13 0450 10/28/13 0533 10/29/13 0559  NA 127* 136* 140 139  K 4.6 4.4 5.0 5.0  CL 90* 99 104 105  CO2 26 26 26 25   GLUCOSE 209* 131* 132* 125*  BUN 67* 41* 25* 15  CREATININE 1.66* 1.32 1.23 1.09  CALCIUM  8.6 8.4 8.6 8.9   Liver Function Tests:  Recent Labs Lab 10/25/13 2224 10/27/13 0450 10/29/13 0559  AST 39* 39* 33  ALT 26 27 26   ALKPHOS 105 85 75  BILITOT 0.5 0.8 0.4  PROT 6.7  5.4* 5.5*  ALBUMIN 2.6* 2.1* 2.1*    Recent Labs Lab 10/25/13 2224  LIPASE 226*   No results found for this basename: AMMONIA,  in the last 168 hours CBC:  Recent Labs Lab 10/25/13 2224 10/27/13 0450 10/28/13 0533 10/29/13 0559  WBC 28.9* 23.3* 16.2* 14.8*  NEUTROABS 26.8*  --   --   --   HGB 11.0* 9.5* 9.5* 9.5*  HCT 31.9* 27.4* 28.3* 28.3*  MCV 83.7 83.8 85.0 85.8  PLT 338 305 327 319   Cardiac Enzymes: No results found for this basename: CKTOTAL, CKMB, CKMBINDEX, TROPONINI,  in the last 168 hours BNP (last 3 results) No results found for this basename: PROBNP,  in the last 8760 hours CBG:  Recent Labs Lab 10/28/13 1157 10/28/13 1630 10/28/13 2123 10/29/13 0821 10/29/13 1253  GLUCAP 162* 166* 177* 114* 142*    Micro Recent Results (from the past 240 hour(s))  BODY FLUID CULTURE     Status: None   Collection Time    10/27/13  6:49 PM      Result Value Ref Range Status   Specimen Description BILE FLUID GALL BLADDER   Final   Special Requests NONE   Final   Gram Stain     Final   Value: NO WBC SEEN     NO ORGANISMS SEEN     Performed at [facility]   Culture     Final   Value: NO GROWTH 1 DAY     Performed at [facility]   Report Status PENDING   Incomplete     Studies: Ir Perc Cholecystostomy  10/28/2013   CLINICAL DATA:  68 year old male with acute cholecystitis and a loculated subcapsular perihepatic fluid collection concerning for biloma versus abscess. He presented super therapeutic on Coumadin with an INR above 7. INR has now decreased to 1.5 and patient is a candidate for percutaneous drainage.  EXAM: CHOLECYSTOSTOMY; IR ULTRASOUND GUIDANCE; IR IMAGE GUIDED DRAINAGE PERCUT CATH PERITONEAL RETROPERIT  Date: 10/28/2013  PROCEDURE: 1. Ultrasound-guided transhepatic puncture of the gallbladder 2. Placement of a 10 French percutaneous cholecystostomy tube under fluoroscopic guidance 3. Ultrasound-guided aspiration of subcapsular  perihepatic fluid 4. Placement of a 10 French percutaneous drain under fluoroscopic guidance into the subcapsular perihepatic fluid collection Interventional Radiologist:  Sterling BigHeath K. McCullough, MD  ANESTHESIA/SEDATION: Moderate (conscious) sedation was used. Two mg Versed, 62.5 mcg Fentanyl were administered intravenously. The patient's vital signs were monitored continuously by radiology nursing throughout the procedure.  Sedation Time: 30 minutes  FLUOROSCOPY TIME:  2 min 6 seconds  CONTRAST:  15mL OMNIPAQUE IOHEXOL 300 MG/ML  SOLN  TECHNIQUE: Informed consent was obtained from the patient following explanation of the procedure, risks, benefits and alternatives. The patient understands, agrees and consents for the procedure. All questions were addressed. A time out was performed.  Maximal barrier sterile technique utilized including caps, mask, sterile gowns, sterile gloves, large sterile drape, hand hygiene, and Betadine skin prep.  The right upper quadrant was interrogated with ultrasound. The gallbladder is distended, thick walled and contains multiple gallstones consistent with acute cholecystitis. Additionally, there is a loculated fluid collection in the subcapsular space inferior to the left hepatic lobe. No direct communication is identified between the gallbladder in  the loculated subhepatic fluid collection.  Attention was first turned to the gallbladder. Using a right lateral intercostal approach, local anesthesia was attained by infiltration with 1% lidocaine and a small dermatotomy was made. Under real-time sonographic guidance, a 21 gauge Accustick needle was advanced along a transhepatic course and into the gallbladder lumen. The Accustick sheath was advanced over the micro wire into the gallbladder lumen. A gentle hand injection of contrast material confirmed location within the gallbladder. An Amplatz wire was then coiled within the gallbladder lumen, the tract dilated and a Cook 10 [place]  all-purpose drain advanced over the wire and formed with the locking pigtail in the gallbladder lumen. Approximately 60 mL of thick, black bile was aspirated. A sample was sent for culture. The catheter was flushed, secured to the skin with 0-Prolene suture and connected to gravity bag for drainage.  Attention was next turned to the subcapsular perihepatic fluid collection. Using a subcostal right upper quadrant approach, the skin was again anesthetized with 1% lidocaine a small dermatotomy made. Under real-time sonographic guidance, an 18 gauge trocar needle was advanced into the subcapsular fluid collection. Aspiration confirmed thick black bile consistent with biloma. Therefore, an Amplatz wire was advanced in the collection, the tract dilated and a Adriana Simas 10 [place] all-purpose drain formed within the fluid collection. A gentle hand injection of contrast material through the tube confirmed dislocation fluoroscopically. Approximately 45 mL of thick black bile was aspirated. The catheter was flushed, secured to the skin with 0 Prolene suture and connected to gravity bag for drainage.  COMPLICATIONS: None immediate.  IMPRESSION: 1. Successful placement of a 10 French transhepatic percutaneous cholecystostomy tube. 60 mL of thick black bile was aspirated. A sample was sent for culture. The tube is left to gravity bag drainage. 2. Ultrasound-guided aspiration of the loculated subcapsular perihepatic fluid collection yielded thick, black bile consistent with a biloma. Therefore, a second 10 French drain was placed within this collection and approximately 45 mL of bile was aspirated. This drain was also left to gravity bag drainage. Plan: The subcostal right upper quadrant drain within in the subcapsular perihepatic fluid collection may be ready for removal in several days once output has become minimal. The right lateral intercostal percutaneous cholecystostomy tube will need to stay in place for a minimum of 4-6 weeks.  Ideally, if the patient becomes a surgical candidate the tube can be removed at the time of cholecystectomy.  Signed,  Sterling Big, MD  [facility]  [facility]   Electronically Signed   By: Malachy Moan M.D.   On: 10/28/2013 08:03   Ir Image Guided Drainage Percut Cath  Peritoneal Retroperit  10/28/2013   CLINICAL DATA:  68 year old male with acute cholecystitis and a loculated subcapsular perihepatic fluid collection concerning for biloma versus abscess. He presented super therapeutic on Coumadin with an INR above 7. INR has now decreased to 1.5 and patient is a candidate for percutaneous drainage.  EXAM: CHOLECYSTOSTOMY; IR ULTRASOUND GUIDANCE; IR IMAGE GUIDED DRAINAGE PERCUT CATH PERITONEAL RETROPERIT  Date: 10/28/2013  PROCEDURE: 1. Ultrasound-guided transhepatic puncture of the gallbladder 2. Placement of a 10 French percutaneous cholecystostomy tube under fluoroscopic guidance 3. Ultrasound-guided aspiration of subcapsular perihepatic fluid 4. Placement of a 10 French percutaneous drain under fluoroscopic guidance into the subcapsular perihepatic fluid collection Interventional Radiologist:  Sterling Big, MD  ANESTHESIA/SEDATION: Moderate (conscious) sedation was used. Two mg Versed, 62.5 mcg Fentanyl were administered intravenously. The patient's vital signs were monitored continuously  by radiology nursing throughout the procedure.  Sedation Time: 30 minutes  FLUOROSCOPY TIME:  2 min 6 seconds  CONTRAST:  15mL OMNIPAQUE IOHEXOL 300 MG/ML  SOLN  TECHNIQUE: Informed consent was obtained from the patient following explanation of the procedure, risks, benefits and alternatives. The patient understands, agrees and consents for the procedure. All questions were addressed. A time out was performed.  Maximal barrier sterile technique utilized including caps, mask, sterile gowns, sterile gloves, large sterile drape, hand hygiene, and Betadine skin  prep.  The right upper quadrant was interrogated with ultrasound. The gallbladder is distended, thick walled and contains multiple gallstones consistent with acute cholecystitis. Additionally, there is a loculated fluid collection in the subcapsular space inferior to the left hepatic lobe. No direct communication is identified between the gallbladder in the loculated subhepatic fluid collection.  Attention was first turned to the gallbladder. Using a right lateral intercostal approach, local anesthesia was attained by infiltration with 1% lidocaine and a small dermatotomy was made. Under real-time sonographic guidance, a 21 gauge Accustick needle was advanced along a transhepatic course and into the gallbladder lumen. The Accustick sheath was advanced over the micro wire into the gallbladder lumen. A gentle hand injection of contrast material confirmed location within the gallbladder. An Amplatz wire was then coiled within the gallbladder lumen, the tract dilated and a Cook 10 [place] all-purpose drain advanced over the wire and formed with the locking pigtail in the gallbladder lumen. Approximately 60 mL of thick, black bile was aspirated. A sample was sent for culture. The catheter was flushed, secured to the skin with 0-Prolene suture and connected to gravity bag for drainage.  Attention was next turned to the subcapsular perihepatic fluid collection. Using a subcostal right upper quadrant approach, the skin was again anesthetized with 1% lidocaine a small dermatotomy made. Under real-time sonographic guidance, an 18 gauge trocar needle was advanced into the subcapsular fluid collection. Aspiration confirmed thick black bile consistent with biloma. Therefore, an Amplatz wire was advanced in the collection, the tract dilated and a Adriana Simas 10 [place] all-purpose drain formed within the fluid collection. A gentle hand injection of contrast material through the tube confirmed dislocation fluoroscopically. Approximately 45  mL of thick black bile was aspirated. The catheter was flushed, secured to the skin with 0 Prolene suture and connected to gravity bag for drainage.  COMPLICATIONS: None immediate.  IMPRESSION: 1. Successful placement of a 10 French transhepatic percutaneous cholecystostomy tube. 60 mL of thick black bile was aspirated. A sample was sent for culture. The tube is left to gravity bag drainage. 2. Ultrasound-guided aspiration of the loculated subcapsular perihepatic fluid collection yielded thick, black bile consistent with a biloma. Therefore, a second 10 French drain was placed within this collection and approximately 45 mL of bile was aspirated. This drain was also left to gravity bag drainage. Plan: The subcostal right upper quadrant drain within in the subcapsular perihepatic fluid collection may be ready for removal in several days once output has become minimal. The right lateral intercostal percutaneous cholecystostomy tube will need to stay in place for a minimum of 4-6 weeks. Ideally, if the patient becomes a surgical candidate the tube can be removed at the time of cholecystectomy.  Signed,  Sterling Big, MD  [facility]  [facility]   Electronically Signed   By: Malachy Moan M.D.   On: 10/28/2013 08:03    Scheduled Meds: . antiseptic oral rinse  15 mL Mouth Rinse q12n4p  .  benazepril  20 mg Oral Daily  . chlorhexidine  15 mL Mouth Rinse BID  . furosemide  40 mg Oral Daily  . insulin aspart  0-15 Units Subcutaneous TID WC  . insulin aspart  0-5 Units Subcutaneous QHS  . piperacillin-tazobactam (ZOSYN)  IV  3.375 g Intravenous Q8H  . vancomycin  1,000 mg Intravenous Q12H  . warfarin  7.5 mg Oral ONCE-1800  . Warfarin - Pharmacist Dosing Inpatient   Does not apply q1800   Continuous Infusions:       Time spent: 35 minutes    Clydia Llano  [facility] Pager [phone] If 7PM-7AM, please contact night-coverage at  [email or web address], password [facility] 10/29/2013, 1:31 PM  LOS: 3 days

## 2013-10-29 NOTE — Progress Notes (Signed)
Subjective: Feels much better, tol diet  Objective: Vital signs in last 24 hours: Temp:  [97.5 F (36.4 C)-97.7 F (36.5 C)] 97.7 F (36.5 C) (05/15 0447) Pulse Rate:  [70-72] 72 (05/15 0447) Resp:  [18] 18 (05/15 0447) BP: (96-137)/(56-77) 137/77 mmHg (05/15 0447) SpO2:  [96 %-97 %] 96 % (05/15 0447) Weight:  [190 lb 4.1 oz (86.3 kg)] 190 lb 4.1 oz (86.3 kg) (05/15 0447) Last BM Date: 10/25/13  Intake/Output from previous day: 05/14 0701 - 05/15 0700 In: 220 [P.O.:220] Out: 540 [Urine:400; Drains:140] Intake/Output this shift:    GI: soft nontender drains with bloody fluid bs present  Lab Results:   Recent Labs  10/28/13 0533 10/29/13 0559  WBC 16.2* 14.8*  HGB 9.5* 9.5*  HCT 28.3* 28.3*  PLT 327 319   BMET  Recent Labs  10/28/13 0533 10/29/13 0559  NA 140 139  K 5.0 5.0  CL 104 105  CO2 26 25  GLUCOSE 132* 125*  BUN 25* 15  CREATININE 1.23 1.09  CALCIUM 8.6 8.9   PT/INR  Recent Labs  10/27/13 1130 10/29/13 0559  LABPROT 17.8* 20.3*  INR 1.51* 1.79*   ABG No results found for this basename: PHART, PCO2, PO2, HCO3,  in the last 72 hours  Studies/Results: Ir Perc Cholecystostomy  10/28/2013   CLINICAL DATA:  68 year old male with acute cholecystitis and a loculated subcapsular perihepatic fluid collection concerning for biloma versus abscess. He presented super therapeutic on Coumadin with an INR above 7. INR has now decreased to 1.5 and patient is a candidate for percutaneous drainage.  EXAM: CHOLECYSTOSTOMY; IR ULTRASOUND GUIDANCE; IR IMAGE GUIDED DRAINAGE PERCUT CATH PERITONEAL RETROPERIT  Date: 10/28/2013  PROCEDURE: 1. Ultrasound-guided transhepatic puncture of the gallbladder 2. Placement of a 10 French percutaneous cholecystostomy tube under fluoroscopic guidance 3. Ultrasound-guided aspiration of subcapsular perihepatic fluid 4. Placement of a 10 French percutaneous drain under fluoroscopic guidance into the subcapsular perihepatic fluid  collection Interventional Radiologist:  Sterling Big, MD  ANESTHESIA/SEDATION: Moderate (conscious) sedation was used. Two mg Versed, 62.5 mcg Fentanyl were administered intravenously. The patient's vital signs were monitored continuously by radiology nursing throughout the procedure.  Sedation Time: 30 minutes  FLUOROSCOPY TIME:  2 min 6 seconds  CONTRAST:  15mL OMNIPAQUE IOHEXOL 300 MG/ML  SOLN  TECHNIQUE: Informed consent was obtained from the patient following explanation of the procedure, risks, benefits and alternatives. The patient understands, agrees and consents for the procedure. All questions were addressed. A time out was performed.  Maximal barrier sterile technique utilized including caps, mask, sterile gowns, sterile gloves, large sterile drape, hand hygiene, and Betadine skin prep.  The right upper quadrant was interrogated with ultrasound. The gallbladder is distended, thick walled and contains multiple gallstones consistent with acute cholecystitis. Additionally, there is a loculated fluid collection in the subcapsular space inferior to the left hepatic lobe. No direct communication is identified between the gallbladder in the loculated subhepatic fluid collection.  Attention was first turned to the gallbladder. Using a right lateral intercostal approach, local anesthesia was attained by infiltration with 1% lidocaine and a small dermatotomy was made. Under real-time sonographic guidance, a 21 gauge Accustick needle was advanced along a transhepatic course and into the gallbladder lumen. The Accustick sheath was advanced over the micro wire into the gallbladder lumen. A gentle hand injection of contrast material confirmed location within the gallbladder. An Amplatz wire was then coiled within the gallbladder lumen, the tract dilated and a Cook 10 [place] all-purpose drain  advanced over the wire and formed with the locking pigtail in the gallbladder lumen. Approximately 60 mL of thick, black  bile was aspirated. A sample was sent for culture. The catheter was flushed, secured to the skin with 0-Prolene suture and connected to gravity bag for drainage.  Attention was next turned to the subcapsular perihepatic fluid collection. Using a subcostal right upper quadrant approach, the skin was again anesthetized with 1% lidocaine a small dermatotomy made. Under real-time sonographic guidance, an 18 gauge trocar needle was advanced into the subcapsular fluid collection. Aspiration confirmed thick black bile consistent with biloma. Therefore, an Amplatz wire was advanced in the collection, the tract dilated and a Adriana Simas 10 [place] all-purpose drain formed within the fluid collection. A gentle hand injection of contrast material through the tube confirmed dislocation fluoroscopically. Approximately 45 mL of thick black bile was aspirated. The catheter was flushed, secured to the skin with 0 Prolene suture and connected to gravity bag for drainage.  COMPLICATIONS: None immediate.  IMPRESSION: 1. Successful placement of a 10 French transhepatic percutaneous cholecystostomy tube. 60 mL of thick black bile was aspirated. A sample was sent for culture. The tube is left to gravity bag drainage. 2. Ultrasound-guided aspiration of the loculated subcapsular perihepatic fluid collection yielded thick, black bile consistent with a biloma. Therefore, a second 10 French drain was placed within this collection and approximately 45 mL of bile was aspirated. This drain was also left to gravity bag drainage. Plan: The subcostal right upper quadrant drain within in the subcapsular perihepatic fluid collection may be ready for removal in several days once output has become minimal. The right lateral intercostal percutaneous cholecystostomy tube will need to stay in place for a minimum of 4-6 weeks. Ideally, if the patient becomes a surgical candidate the tube can be removed at the time of cholecystectomy.  Signed,  Sterling Big,  MD  [facility]  [facility]   Electronically Signed   By: Malachy Moan M.D.   On: 10/28/2013 08:03   Ir Image Guided Drainage Percut Cath  Peritoneal Retroperit  10/28/2013   CLINICAL DATA:  68 year old male with acute cholecystitis and a loculated subcapsular perihepatic fluid collection concerning for biloma versus abscess. He presented super therapeutic on Coumadin with an INR above 7. INR has now decreased to 1.5 and patient is a candidate for percutaneous drainage.  EXAM: CHOLECYSTOSTOMY; IR ULTRASOUND GUIDANCE; IR IMAGE GUIDED DRAINAGE PERCUT CATH PERITONEAL RETROPERIT  Date: 10/28/2013  PROCEDURE: 1. Ultrasound-guided transhepatic puncture of the gallbladder 2. Placement of a 10 French percutaneous cholecystostomy tube under fluoroscopic guidance 3. Ultrasound-guided aspiration of subcapsular perihepatic fluid 4. Placement of a 10 French percutaneous drain under fluoroscopic guidance into the subcapsular perihepatic fluid collection Interventional Radiologist:  Sterling Big, MD  ANESTHESIA/SEDATION: Moderate (conscious) sedation was used. Two mg Versed, 62.5 mcg Fentanyl were administered intravenously. The patient's vital signs were monitored continuously by radiology nursing throughout the procedure.  Sedation Time: 30 minutes  FLUOROSCOPY TIME:  2 min 6 seconds  CONTRAST:  15mL OMNIPAQUE IOHEXOL 300 MG/ML  SOLN  TECHNIQUE: Informed consent was obtained from the patient following explanation of the procedure, risks, benefits and alternatives. The patient understands, agrees and consents for the procedure. All questions were addressed. A time out was performed.  Maximal barrier sterile technique utilized including caps, mask, sterile gowns, sterile gloves, large sterile drape, hand hygiene, and Betadine skin prep.  The right upper quadrant was interrogated with ultrasound. The gallbladder is  distended, thick walled and contains multiple gallstones  consistent with acute cholecystitis. Additionally, there is a loculated fluid collection in the subcapsular space inferior to the left hepatic lobe. No direct communication is identified between the gallbladder in the loculated subhepatic fluid collection.  Attention was first turned to the gallbladder. Using a right lateral intercostal approach, local anesthesia was attained by infiltration with 1% lidocaine and a small dermatotomy was made. Under real-time sonographic guidance, a 21 gauge Accustick needle was advanced along a transhepatic course and into the gallbladder lumen. The Accustick sheath was advanced over the micro wire into the gallbladder lumen. A gentle hand injection of contrast material confirmed location within the gallbladder. An Amplatz wire was then coiled within the gallbladder lumen, the tract dilated and a Cook 10 [place] all-purpose drain advanced over the wire and formed with the locking pigtail in the gallbladder lumen. Approximately 60 mL of thick, black bile was aspirated. A sample was sent for culture. The catheter was flushed, secured to the skin with 0-Prolene suture and connected to gravity bag for drainage.  Attention was next turned to the subcapsular perihepatic fluid collection. Using a subcostal right upper quadrant approach, the skin was again anesthetized with 1% lidocaine a small dermatotomy made. Under real-time sonographic guidance, an 18 gauge trocar needle was advanced into the subcapsular fluid collection. Aspiration confirmed thick black bile consistent with biloma. Therefore, an Amplatz wire was advanced in the collection, the tract dilated and a Adriana Simas 10 [place] all-purpose drain formed within the fluid collection. A gentle hand injection of contrast material through the tube confirmed dislocation fluoroscopically. Approximately 45 mL of thick black bile was aspirated. The catheter was flushed, secured to the skin with 0 Prolene suture and connected to gravity bag for  drainage.  COMPLICATIONS: None immediate.  IMPRESSION: 1. Successful placement of a 10 French transhepatic percutaneous cholecystostomy tube. 60 mL of thick black bile was aspirated. A sample was sent for culture. The tube is left to gravity bag drainage. 2. Ultrasound-guided aspiration of the loculated subcapsular perihepatic fluid collection yielded thick, black bile consistent with a biloma. Therefore, a second 10 French drain was placed within this collection and approximately 45 mL of bile was aspirated. This drain was also left to gravity bag drainage. Plan: The subcostal right upper quadrant drain within in the subcapsular perihepatic fluid collection may be ready for removal in several days once output has become minimal. The right lateral intercostal percutaneous cholecystostomy tube will need to stay in place for a minimum of 4-6 weeks. Ideally, if the patient becomes a surgical candidate the tube can be removed at the time of cholecystectomy.  Signed,  Sterling Big, MD  [facility]  [facility]   Electronically Signed   By: Malachy Moan M.D.   On: 10/28/2013 08:03    Anti-infectives: Anti-infectives   Start     Dose/Rate Route Frequency Ordered Stop   10/29/13 0800  vancomycin (VANCOCIN) IVPB 1000 mg/200 mL premix     1,000 mg 200 mL/hr over 60 Minutes Intravenous Every 12 hours 10/28/13 2039     10/26/13 2000  vancomycin (VANCOCIN) IVPB 750 mg/150 ml premix  Status:  Discontinued     750 mg 150 mL/hr over 60 Minutes Intravenous Every 12 hours 10/26/13 0709 10/28/13 2039   10/26/13 0715  vancomycin (VANCOCIN) IVPB 1000 mg/200 mL premix     1,000 mg 200 mL/hr over 60 Minutes Intravenous  Once 10/26/13 0709 10/26/13 1137  10/26/13 0715  piperacillin-tazobactam (ZOSYN) IVPB 3.375 g     3.375 g 12.5 mL/hr over 240 Minutes Intravenous Every 8 hours 10/26/13 0709     10/26/13 0315  piperacillin-tazobactam (ZOSYN) IVPB 3.375 g      3.375 g 12.5 mL/hr over 240 Minutes Intravenous  Once 10/26/13 0303 10/26/13 [id number]      Assessment/Plan: Complicated cholecystitis  Doing better, on regular diet, would continue iv abx until wbc normal then transition to po to go home on. Liver drain may be able to come out while here Will not remove perc chole drain until surgery Surgery in a couple months I can see back 2-3 weeks after discharge   Emelia LoronMatthew Jahzier Villalon 10/29/2013

## 2013-10-29 NOTE — Progress Notes (Signed)
DAILY PROGRESS NOTE  Subjective:  Feeling better after IR drainage. Plan for surgery in a few months.  Holding warfarin.  Objective:  Temp:  [97.5 F (36.4 C)-97.7 F (36.5 C)] 97.7 F (36.5 C) (05/15 0447) Pulse Rate:  [70-72] 72 (05/15 0447) Resp:  [18] 18 (05/15 0447) BP: (96-137)/(56-77) 137/77 mmHg (05/15 0447) SpO2:  [96 %-97 %] 96 % (05/15 0447) Weight:  [190 lb 4.1 oz (86.3 kg)] 190 lb 4.1 oz (86.3 kg) (05/15 0447) Weight change: 1 lb 4.1 oz (0.57 kg)  Intake/Output from previous day: 05/14 0701 - 05/15 0700 In: 220 [P.O.:220] Out: 540 [Urine:400; Drains:140]  Intake/Output from this shift:    Medications: Current Facility-Administered Medications  Medication Dose Route Frequency Provider Last Rate Last Dose  . antiseptic oral rinse (BIOTENE) solution 15 mL  15 mL Mouth Rinse q12n4p Maryann Mikhail, DO   15 mL at 10/28/13 1600  . chlorhexidine (PERIDEX) 0.12 % solution 15 mL  15 mL Mouth Rinse BID Maryann Mikhail, DO   15 mL at 10/29/13 0829  . HYDROcodone-acetaminophen (NORCO/VICODIN) 5-325 MG per tablet 1-2 tablet  1-2 tablet Oral Q4H PRN Verlee Monte, MD   1 tablet at 10/29/13 0433  . HYDROmorphone (DILAUDID) injection 1 mg  1 mg Intravenous Q2H PRN Phillips Grout, MD   1 mg at 10/28/13 7948  . insulin aspart (novoLOG) injection 0-15 Units  0-15 Units Subcutaneous TID WC Lezlie Octave Black, NP      . insulin aspart (novoLOG) injection 0-5 Units  0-5 Units Subcutaneous QHS Lezlie Octave Black, NP      . ondansetron [facility]) tablet 4 mg  4 mg Oral Q6H PRN Phillips Grout, MD       Or  . ondansetron [facility]) injection 4 mg  4 mg Intravenous Q6H PRN Phillips Grout, MD      . piperacillin-tazobactam (ZOSYN) IVPB 3.375 g  3.375 g Intravenous Q8H Rogue Bussing, RPH 12.5 mL/hr at 10/29/13 0508 3.375 g at 10/29/13 0508  . vancomycin (VANCOCIN) IVPB 1000 mg/200 mL premix  1,000 mg Intravenous Q12H Charmian Muff [place], RPH   1,000 mg at 10/29/13 0165    Physical  Exam: General appearance: alert and no distress Neck: no carotid bruit and no JVD Lungs: clear to auscultation bilaterally Heart: regular rate and rhythm, S1, S2 normal, no murmur, click, rub or gallop Abdomen: soft, non-tender; bowel sounds normal; no masses,  no organomegaly and right-sided percutaneous drain in place Extremities: extremities normal, atraumatic, no cyanosis or edema Pulses: 2+ and symmetric Skin: Skin color, texture, turgor normal. No rashes or lesions Neurologic: Grossly normal Psych: Mood, affect normal  Lab Results: Results for orders placed during the hospital encounter of 10/26/13 (from the past 48 hour(s))  PROTIME-INR     Status: Abnormal   Collection Time    10/27/13 11:30 AM      Result Value Ref Range   Prothrombin Time 17.8 (*) 11.6 - 15.2 seconds   INR 1.51 (*) 0.00 - 1.49  GLUCOSE, CAPILLARY     Status: Abnormal   Collection Time    10/27/13 12:03 PM      Result Value Ref Range   Glucose-Capillary 157 (*) 70 - 99 mg/dL  GLUCOSE, CAPILLARY     Status: Abnormal   Collection Time    10/27/13  4:00 PM      Result Value Ref Range   Glucose-Capillary 122 (*) 70 - 99 mg/dL  BODY FLUID CULTURE  Status: None   Collection Time    10/27/13  6:49 PM      Result Value Ref Range   Specimen Description BILE FLUID GALL BLADDER     Special Requests NONE     Gram Stain       Value: NO WBC SEEN     NO ORGANISMS SEEN     Performed at [facility]   Culture       Value: NO GROWTH 1 DAY     Performed at [facility]   Report Status PENDING    GLUCOSE, CAPILLARY     Status: Abnormal   Collection Time    10/27/13  8:11 PM      Result Value Ref Range   Glucose-Capillary 127 (*) 70 - 99 mg/dL  GLUCOSE, CAPILLARY     Status: Abnormal   Collection Time    10/28/13 12:33 AM      Result Value Ref Range   Glucose-Capillary 165 (*) 70 - 99 mg/dL   Comment 1 Documented in Chart     Comment 2 Notify RN    GLUCOSE, CAPILLARY     Status: Abnormal    Collection Time    10/28/13  4:04 AM      Result Value Ref Range   Glucose-Capillary 129 (*) 70 - 99 mg/dL  BASIC METABOLIC PANEL     Status: Abnormal   Collection Time    10/28/13  5:33 AM      Result Value Ref Range   Sodium 140  137 - 147 mEq/L   Potassium 5.0  3.7 - 5.3 mEq/L   Chloride 104  96 - 112 mEq/L   CO2 26  19 - 32 mEq/L   Glucose, Bld 132 (*) 70 - 99 mg/dL   BUN 25 (*) 6 - 23 mg/dL   Creatinine, Ser 1.23  0.50 - 1.35 mg/dL   Calcium 8.6  8.4 - 10.5 mg/dL   GFR calc non Af Amer 59 (*) >90 mL/min   GFR calc Af Amer 68 (*) >90 mL/min   Comment: (NOTE)     The eGFR has been calculated using the CKD EPI equation.     This calculation has not been validated in all clinical situations.     eGFR's persistently <90 mL/min signify possible Chronic Kidney     Disease.  CBC     Status: Abnormal   Collection Time    10/28/13  5:33 AM      Result Value Ref Range   WBC 16.2 (*) 4.0 - 10.5 K/uL   RBC 3.33 (*) 4.22 - 5.81 MIL/uL   Hemoglobin 9.5 (*) 13.0 - 17.0 g/dL   HCT 28.3 (*) 39.0 - 52.0 %   MCV 85.0  78.0 - 100.0 fL   MCH 28.5  26.0 - 34.0 pg   MCHC 33.6  30.0 - 36.0 g/dL   RDW 16.1 (*) 11.5 - 15.5 %   Platelets 327  150 - 400 K/uL  GLUCOSE, CAPILLARY     Status: Abnormal   Collection Time    10/28/13  8:18 AM      Result Value Ref Range   Glucose-Capillary 132 (*) 70 - 99 mg/dL  GLUCOSE, CAPILLARY     Status: Abnormal   Collection Time    10/28/13 11:57 AM      Result Value Ref Range   Glucose-Capillary 162 (*) 70 - 99 mg/dL  GLUCOSE, CAPILLARY     Status: Abnormal   Collection  Time    10/28/13  4:30 PM      Result Value Ref Range   Glucose-Capillary 166 (*) 70 - 99 mg/dL  VANCOMYCIN, TROUGH     Status: None   Collection Time    10/28/13  7:24 PM      Result Value Ref Range   Vancomycin Tr 12.0  10.0 - 20.0 ug/mL  GLUCOSE, CAPILLARY     Status: Abnormal   Collection Time    10/28/13  9:23 PM      Result Value Ref Range   Glucose-Capillary 177 (*) 70  - 99 mg/dL  COMPREHENSIVE METABOLIC PANEL     Status: Abnormal   Collection Time    10/29/13  5:59 AM      Result Value Ref Range   Sodium 139  137 - 147 mEq/L   Potassium 5.0  3.7 - 5.3 mEq/L   Chloride 105  96 - 112 mEq/L   CO2 25  19 - 32 mEq/L   Glucose, Bld 125 (*) 70 - 99 mg/dL   BUN 15  6 - 23 mg/dL   Creatinine, Ser 1.09  0.50 - 1.35 mg/dL   Calcium 8.9  8.4 - 10.5 mg/dL   Total Protein 5.5 (*) 6.0 - 8.3 g/dL   Albumin 2.1 (*) 3.5 - 5.2 g/dL   AST 33  0 - 37 U/L   ALT 26  0 - 53 U/L   Alkaline Phosphatase 75  39 - 117 U/L   Total Bilirubin 0.4  0.3 - 1.2 mg/dL   GFR calc non Af Amer 68 (*) >90 mL/min   GFR calc Af Amer 79 (*) >90 mL/min   Comment: (NOTE)     The eGFR has been calculated using the CKD EPI equation.     This calculation has not been validated in all clinical situations.     eGFR's persistently <90 mL/min signify possible Chronic Kidney     Disease.  CBC     Status: Abnormal   Collection Time    10/29/13  5:59 AM      Result Value Ref Range   WBC 14.8 (*) 4.0 - 10.5 K/uL   RBC 3.30 (*) 4.22 - 5.81 MIL/uL   Hemoglobin 9.5 (*) 13.0 - 17.0 g/dL   HCT 28.3 (*) 39.0 - 52.0 %   MCV 85.8  78.0 - 100.0 fL   MCH 28.8  26.0 - 34.0 pg   MCHC 33.6  30.0 - 36.0 g/dL   RDW 15.9 (*) 11.5 - 15.5 %   Platelets 319  150 - 400 K/uL  PROTIME-INR     Status: Abnormal   Collection Time    10/29/13  5:59 AM      Result Value Ref Range   Prothrombin Time 20.3 (*) 11.6 - 15.2 seconds   INR 1.79 (*) 0.00 - 1.49  GLUCOSE, CAPILLARY     Status: Abnormal   Collection Time    10/29/13  8:21 AM      Result Value Ref Range   Glucose-Capillary 114 (*) 70 - 99 mg/dL    Imaging: Ir Perc Cholecystostomy  10/28/2013   CLINICAL DATA:  68 year old male with acute cholecystitis and a loculated subcapsular perihepatic fluid collection concerning for biloma versus abscess. He presented super therapeutic on Coumadin with an INR above 7. INR has now decreased to 1.5 and patient is a  candidate for percutaneous drainage.  EXAM: CHOLECYSTOSTOMY; IR ULTRASOUND GUIDANCE; IR IMAGE GUIDED DRAINAGE PERCUT CATH PERITONEAL RETROPERIT  Date: 10/28/2013  PROCEDURE: 1. Ultrasound-guided transhepatic puncture of the gallbladder 2. Placement of a 10 French percutaneous cholecystostomy tube under fluoroscopic guidance 3. Ultrasound-guided aspiration of subcapsular perihepatic fluid 4. Placement of a 10 French percutaneous drain under fluoroscopic guidance into the subcapsular perihepatic fluid collection Interventional Radiologist:  Criselda Peaches, MD  ANESTHESIA/SEDATION: Moderate (conscious) sedation was used. Two mg Versed, 62.5 mcg Fentanyl were administered intravenously. The patient's vital signs were monitored continuously by radiology nursing throughout the procedure.  Sedation Time: 30 minutes  FLUOROSCOPY TIME:  2 min 6 seconds  CONTRAST:  44m OMNIPAQUE IOHEXOL 300 MG/ML  SOLN  TECHNIQUE: Informed consent was obtained from the patient following explanation of the procedure, risks, benefits and alternatives. The patient understands, agrees and consents for the procedure. All questions were addressed. A time out was performed.  Maximal barrier sterile technique utilized including caps, mask, sterile gowns, sterile gloves, large sterile drape, hand hygiene, and Betadine skin prep.  The right upper quadrant was interrogated with ultrasound. The gallbladder is distended, thick walled and contains multiple gallstones consistent with acute cholecystitis. Additionally, there is a loculated fluid collection in the subcapsular space inferior to the left hepatic lobe. No direct communication is identified between the gallbladder in the loculated subhepatic fluid collection.  Attention was first turned to the gallbladder. Using a right lateral intercostal approach, local anesthesia was attained by infiltration with 1% lidocaine and a small dermatotomy was made. Under real-time sonographic guidance, a 21  gauge Accustick needle was advanced along a transhepatic course and into the gallbladder lumen. The Accustick sheath was advanced over the micro wire into the gallbladder lumen. A gentle hand injection of contrast material confirmed location within the gallbladder. An Amplatz wire was then coiled within the gallbladder lumen, the tract dilated and a Cook 10 [place] drain advanced over the wire and formed with the locking pigtail in the gallbladder lumen. Approximately 60 mL of thick, black bile was aspirated. A sample was sent for culture. The catheter was flushed, secured to the skin with 0-Prolene suture and connected to gravity bag for drainage.  Attention was next turned to the subcapsular perihepatic fluid collection. Using a subcostal right upper quadrant approach, the skin was again anesthetized with 1% lidocaine a small dermatotomy made. Under real-time sonographic guidance, an 18 gauge trocar needle was advanced into the subcapsular fluid collection. Aspiration confirmed thick black bile consistent with biloma. Therefore, an Amplatz wire was advanced in the collection, the tract dilated and a [place]inda Axon10 [place]purpose drain formed within the fluid collection. A gentle hand injection of contrast material through the tube confirmed dislocation fluoroscopically. Approximately 45 mL of thick black bile was aspirated. The catheter was flushed, secured to the skin with 0 Prolene suture and connected to gravity bag for drainage.  COMPLICATIONS: None immediate.  IMPRESSION: 1. Successful placement of a 10 French transhepatic percutaneous cholecystostomy tube. 60 mL of thick black bile was aspirated. A sample was sent for culture. The tube is left to gravity bag drainage. 2. Ultrasound-guided aspiration of the loculated subcapsular perihepatic fluid collection yielded thick, black bile consistent with a biloma. Therefore, a second 10 French drain was placed within this collection and approximately 45 mL  of bile was aspirated. This drain was also left to gravity bag drainage. Plan: The subcostal right upper quadrant drain within in the subcapsular perihepatic fluid collection may be ready for removal in several days once output has become minimal. The right lateral intercostal percutaneous cholecystostomy tube will  need to stay in place for a minimum of 4-6 weeks. Ideally, if the patient becomes a surgical candidate the tube can be removed at the time of cholecystectomy.  Signed,  Criselda Peaches, MD  [facility]  [facility]   Electronically Signed   By: Jacqulynn Cadet M.D.   On: 10/28/2013 08:03   Ir Image Guided Drainage Percut Cath  Peritoneal Retroperit  10/28/2013   CLINICAL DATA:  68 year old male with acute cholecystitis and a loculated subcapsular perihepatic fluid collection concerning for biloma versus abscess. He presented super therapeutic on Coumadin with an INR above 7. INR has now decreased to 1.5 and patient is a candidate for percutaneous drainage.  EXAM: CHOLECYSTOSTOMY; IR ULTRASOUND GUIDANCE; IR IMAGE GUIDED DRAINAGE PERCUT CATH PERITONEAL RETROPERIT  Date: 10/28/2013  PROCEDURE: 1. Ultrasound-guided transhepatic puncture of the gallbladder 2. Placement of a 10 French percutaneous cholecystostomy tube under fluoroscopic guidance 3. Ultrasound-guided aspiration of subcapsular perihepatic fluid 4. Placement of a 10 French percutaneous drain under fluoroscopic guidance into the subcapsular perihepatic fluid collection Interventional Radiologist:  Criselda Peaches, MD  ANESTHESIA/SEDATION: Moderate (conscious) sedation was used. Two mg Versed, 62.5 mcg Fentanyl were administered intravenously. The patient's vital signs were monitored continuously by radiology nursing throughout the procedure.  Sedation Time: 30 minutes  FLUOROSCOPY TIME:  2 min 6 seconds  CONTRAST:  82m OMNIPAQUE IOHEXOL 300 MG/ML  SOLN  TECHNIQUE: Informed consent was  obtained from the patient following explanation of the procedure, risks, benefits and alternatives. The patient understands, agrees and consents for the procedure. All questions were addressed. A time out was performed.  Maximal barrier sterile technique utilized including caps, mask, sterile gowns, sterile gloves, large sterile drape, hand hygiene, and Betadine skin prep.  The right upper quadrant was interrogated with ultrasound. The gallbladder is distended, thick walled and contains multiple gallstones consistent with acute cholecystitis. Additionally, there is a loculated fluid collection in the subcapsular space inferior to the left hepatic lobe. No direct communication is identified between the gallbladder in the loculated subhepatic fluid collection.  Attention was first turned to the gallbladder. Using a right lateral intercostal approach, local anesthesia was attained by infiltration with 1% lidocaine and a small dermatotomy was made. Under real-time sonographic guidance, a 21 gauge Accustick needle was advanced along a transhepatic course and into the gallbladder lumen. The Accustick sheath was advanced over the micro wire into the gallbladder lumen. A gentle hand injection of contrast material confirmed location within the gallbladder. An Amplatz wire was then coiled within the gallbladder lumen, the tract dilated and a Cook 10 [place]purpose drain advanced over the wire and formed with the locking pigtail in the gallbladder lumen. Approximately 60 mL of thick, black bile was aspirated. A sample was sent for culture. The catheter was flushed, secured to the skin with 0-Prolene suture and connected to gravity bag for drainage.  Attention was next turned to the subcapsular perihepatic fluid collection. Using a subcostal right upper quadrant approach, the skin was again anesthetized with 1% lidocaine a small dermatotomy made. Under real-time sonographic guidance, an 18 gauge trocar needle was advanced  into the subcapsular fluid collection. Aspiration confirmed thick black bile consistent with biloma. Therefore, an Amplatz wire was advanced in the collection, the tract dilated and a CLacinda Axon10 [place]-purpose drain formed within the fluid collection. A gentle hand injection of contrast material through the tube confirmed dislocation fluoroscopically. Approximately 45 mL of thick black bile was aspirated. The catheter was  flushed, secured to the skin with 0 Prolene suture and connected to gravity bag for drainage.  COMPLICATIONS: None immediate.  IMPRESSION: 1. Successful placement of a 10 French transhepatic percutaneous cholecystostomy tube. 60 mL of thick black bile was aspirated. A sample was sent for culture. The tube is left to gravity bag drainage. 2. Ultrasound-guided aspiration of the loculated subcapsular perihepatic fluid collection yielded thick, black bile consistent with a biloma. Therefore, a second 10 French drain was placed within this collection and approximately 45 mL of bile was aspirated. This drain was also left to gravity bag drainage. Plan: The subcostal right upper quadrant drain within in the subcapsular perihepatic fluid collection may be ready for removal in several days once output has become minimal. The right lateral intercostal percutaneous cholecystostomy tube will need to stay in place for a minimum of 4-6 weeks. Ideally, if the patient becomes a surgical candidate the tube can be removed at the time of cholecystectomy.  Signed,  Criselda Peaches, MD  [facility]  [facility]   Electronically Signed   By: Jacqulynn Cadet M.D.   On: 10/28/2013 08:03    Assessment:  1. Principal Problem: 2.   Acute cholecystitis 3. Active Problems: 4.   Essential hypertension, benign 5.   Atrial fibrillation 6.   Chronic systolic heart failure 7.   Biventricular implantable cardioverter-defibrillator in situ 8.   Ventricular tachycardia  (paroxysmal) 9.   Abdominal pain, acute, right upper quadrant 10.   Cholecystitis 11.   Dehydration 12.   Acute renal failure 13.   PNA (pneumonia) 14.   CAD (coronary artery disease), native coronary artery 15.   Cirrhosis 16.   Plan:  1. Feeling better. He has been off of his CHF meds - predominantly due to hypotension. Would restart home benazapril 20 mg daily (half dose) - if bp tolerates, can eventually add back metoprolol. Digoxin discontinued. Would restart Coumadin (brand name), if ok from IR standpoint. Hold zetia.  Will also need to restart lasix - again 40 mg daily for now.  Time Spent Directly with Patient:  15 minutes  Length of Stay:  LOS: 3 days   Pixie Casino, MD, [facility] Attending Cardiologist [place] 10/29/2013, 9:35 AM

## 2013-10-29 NOTE — Progress Notes (Signed)
Subjective: Patient states he feels much better.   Objective: Physical Exam: BP 105/59  Pulse 72  Temp(Src) 97.7 F (36.5 C) (Oral)  Resp 18  Ht 6' 3.98" (1.93 m)  Wt 190 lb 4.1 oz (86.3 kg)  BMI 23.17 kg/m2  SpO2 96%  RUQ(chole) drain intact, site clean, NT, last 24 hour 35 cc dark bile Epigastric(perihepatic) drain intact, site clean, NT, last 24 hours 105 cc, now with minimal output.  Labs: CBC  Recent Labs  10/28/13 0533 10/29/13 0559  WBC 16.2* 14.8*  HGB 9.5* 9.5*  HCT 28.3* 28.3*  PLT 327 319   BMET  Recent Labs  10/28/13 0533 10/29/13 0559  NA 140 139  K 5.0 5.0  CL 104 105  CO2 26 25  GLUCOSE 132* 125*  BUN 25* 15  CREATININE 1.23 1.09  CALCIUM 8.6 8.9   LFT  Recent Labs  10/29/13 0559  PROT 5.5*  ALBUMIN 2.1*  AST 33  ALT 26  ALKPHOS 75  BILITOT 0.4   PT/INR  Recent Labs  10/27/13 1130 10/29/13 0559  LABPROT 17.8* 20.3*  INR 1.51* 1.79*     Studies/Results: Ir Perc Cholecystostomy  10/28/2013   CLINICAL DATA:  68 year old male with acute cholecystitis and a loculated subcapsular perihepatic fluid collection concerning for biloma versus abscess. He presented super therapeutic on Coumadin with an INR above 7. INR has now decreased to 1.5 and patient is a candidate for percutaneous drainage.  EXAM: CHOLECYSTOSTOMY; IR ULTRASOUND GUIDANCE; IR IMAGE GUIDED DRAINAGE PERCUT CATH PERITONEAL RETROPERIT  Date: 10/28/2013  PROCEDURE: 1. Ultrasound-guided transhepatic puncture of the gallbladder 2. Placement of a 10 French percutaneous cholecystostomy tube under fluoroscopic guidance 3. Ultrasound-guided aspiration of subcapsular perihepatic fluid 4. Placement of a 10 French percutaneous drain under fluoroscopic guidance into the subcapsular perihepatic fluid collection Interventional Radiologist:  Sterling BigHeath K. McCullough, MD  ANESTHESIA/SEDATION: Moderate (conscious) sedation was used. Two mg Versed, 62.5 mcg Fentanyl were administered intravenously.  The patient's vital signs were monitored continuously by radiology nursing throughout the procedure.  Sedation Time: 30 minutes  FLUOROSCOPY TIME:  2 min 6 seconds  CONTRAST:  15mL OMNIPAQUE IOHEXOL 300 MG/ML  SOLN  TECHNIQUE: Informed consent was obtained from the patient following explanation of the procedure, risks, benefits and alternatives. The patient understands, agrees and consents for the procedure. All questions were addressed. A time out was performed.  Maximal barrier sterile technique utilized including caps, mask, sterile gowns, sterile gloves, large sterile drape, hand hygiene, and Betadine skin prep.  The right upper quadrant was interrogated with ultrasound. The gallbladder is distended, thick walled and contains multiple gallstones consistent with acute cholecystitis. Additionally, there is a loculated fluid collection in the subcapsular space inferior to the left hepatic lobe. No direct communication is identified between the gallbladder in the loculated subhepatic fluid collection.  Attention was first turned to the gallbladder. Using a right lateral intercostal approach, local anesthesia was attained by infiltration with 1% lidocaine and a small dermatotomy was made. Under real-time sonographic guidance, a 21 gauge Accustick needle was advanced along a transhepatic course and into the gallbladder lumen. The Accustick sheath was advanced over the micro wire into the gallbladder lumen. A gentle hand injection of contrast material confirmed location within the gallbladder. An Amplatz wire was then coiled within the gallbladder lumen, the tract dilated and a Cook 10 [place] all-purpose drain advanced over the wire and formed with the locking pigtail in the gallbladder lumen. Approximately 60 mL of thick, black bile was  aspirated. A sample was sent for culture. The catheter was flushed, secured to the skin with 0-Prolene suture and connected to gravity bag for drainage.  Attention was next turned to  the subcapsular perihepatic fluid collection. Using a subcostal right upper quadrant approach, the skin was again anesthetized with 1% lidocaine a small dermatotomy made. Under real-time sonographic guidance, an 18 gauge trocar needle was advanced into the subcapsular fluid collection. Aspiration confirmed thick black bile consistent with biloma. Therefore, an Amplatz wire was advanced in the collection, the tract dilated and a Adriana Simas 10 [place] all-purpose drain formed within the fluid collection. A gentle hand injection of contrast material through the tube confirmed dislocation fluoroscopically. Approximately 45 mL of thick black bile was aspirated. The catheter was flushed, secured to the skin with 0 Prolene suture and connected to gravity bag for drainage.  COMPLICATIONS: None immediate.  IMPRESSION: 1. Successful placement of a 10 French transhepatic percutaneous cholecystostomy tube. 60 mL of thick black bile was aspirated. A sample was sent for culture. The tube is left to gravity bag drainage. 2. Ultrasound-guided aspiration of the loculated subcapsular perihepatic fluid collection yielded thick, black bile consistent with a biloma. Therefore, a second 10 French drain was placed within this collection and approximately 45 mL of bile was aspirated. This drain was also left to gravity bag drainage. Plan: The subcostal right upper quadrant drain within in the subcapsular perihepatic fluid collection may be ready for removal in several days once output has become minimal. The right lateral intercostal percutaneous cholecystostomy tube will need to stay in place for a minimum of 4-6 weeks. Ideally, if the patient becomes a surgical candidate the tube can be removed at the time of cholecystectomy.  Signed,  Sterling Big, MD  [facility]  [facility]   Electronically Signed   By: Malachy Moan M.D.   On: 10/28/2013 08:03   Ir Image Guided Drainage Percut  Cath  Peritoneal Retroperit  10/28/2013   CLINICAL DATA:  68 year old male with acute cholecystitis and a loculated subcapsular perihepatic fluid collection concerning for biloma versus abscess. He presented super therapeutic on Coumadin with an INR above 7. INR has now decreased to 1.5 and patient is a candidate for percutaneous drainage.  EXAM: CHOLECYSTOSTOMY; IR ULTRASOUND GUIDANCE; IR IMAGE GUIDED DRAINAGE PERCUT CATH PERITONEAL RETROPERIT  Date: 10/28/2013  PROCEDURE: 1. Ultrasound-guided transhepatic puncture of the gallbladder 2. Placement of a 10 French percutaneous cholecystostomy tube under fluoroscopic guidance 3. Ultrasound-guided aspiration of subcapsular perihepatic fluid 4. Placement of a 10 French percutaneous drain under fluoroscopic guidance into the subcapsular perihepatic fluid collection Interventional Radiologist:  Sterling Big, MD  ANESTHESIA/SEDATION: Moderate (conscious) sedation was used. Two mg Versed, 62.5 mcg Fentanyl were administered intravenously. The patient's vital signs were monitored continuously by radiology nursing throughout the procedure.  Sedation Time: 30 minutes  FLUOROSCOPY TIME:  2 min 6 seconds  CONTRAST:  29mL OMNIPAQUE IOHEXOL 300 MG/ML  SOLN  TECHNIQUE: Informed consent was obtained from the patient following explanation of the procedure, risks, benefits and alternatives. The patient understands, agrees and consents for the procedure. All questions were addressed. A time out was performed.  Maximal barrier sterile technique utilized including caps, mask, sterile gowns, sterile gloves, large sterile drape, hand hygiene, and Betadine skin prep.  The right upper quadrant was interrogated with ultrasound. The gallbladder is distended, thick walled and contains multiple gallstones consistent with acute cholecystitis. Additionally, there is a loculated fluid collection in the subcapsular space  inferior to the left hepatic lobe. No direct communication is identified  between the gallbladder in the loculated subhepatic fluid collection.  Attention was first turned to the gallbladder. Using a right lateral intercostal approach, local anesthesia was attained by infiltration with 1% lidocaine and a small dermatotomy was made. Under real-time sonographic guidance, a 21 gauge Accustick needle was advanced along a transhepatic course and into the gallbladder lumen. The Accustick sheath was advanced over the micro wire into the gallbladder lumen. A gentle hand injection of contrast material confirmed location within the gallbladder. An Amplatz wire was then coiled within the gallbladder lumen, the tract dilated and a Cook 10 [place] all-purpose drain advanced over the wire and formed with the locking pigtail in the gallbladder lumen. Approximately 60 mL of thick, black bile was aspirated. A sample was sent for culture. The catheter was flushed, secured to the skin with 0-Prolene suture and connected to gravity bag for drainage.  Attention was next turned to the subcapsular perihepatic fluid collection. Using a subcostal right upper quadrant approach, the skin was again anesthetized with 1% lidocaine a small dermatotomy made. Under real-time sonographic guidance, an 18 gauge trocar needle was advanced into the subcapsular fluid collection. Aspiration confirmed thick black bile consistent with biloma. Therefore, an Amplatz wire was advanced in the collection, the tract dilated and a Adriana Simas 10 [place] all-purpose drain formed within the fluid collection. A gentle hand injection of contrast material through the tube confirmed dislocation fluoroscopically. Approximately 45 mL of thick black bile was aspirated. The catheter was flushed, secured to the skin with 0 Prolene suture and connected to gravity bag for drainage.  COMPLICATIONS: None immediate.  IMPRESSION: 1. Successful placement of a 10 French transhepatic percutaneous cholecystostomy tube. 60 mL of thick black bile was aspirated. A  sample was sent for culture. The tube is left to gravity bag drainage. 2. Ultrasound-guided aspiration of the loculated subcapsular perihepatic fluid collection yielded thick, black bile consistent with a biloma. Therefore, a second 10 French drain was placed within this collection and approximately 45 mL of bile was aspirated. This drain was also left to gravity bag drainage. Plan: The subcostal right upper quadrant drain within in the subcapsular perihepatic fluid collection may be ready for removal in several days once output has become minimal. The right lateral intercostal percutaneous cholecystostomy tube will need to stay in place for a minimum of 4-6 weeks. Ideally, if the patient becomes a surgical candidate the tube can be removed at the time of cholecystectomy.  Signed,  Sterling Big, MD  [facility]  [facility]   Electronically Signed   By: Malachy Moan M.D.   On: 10/28/2013 08:03    Assessment/Plan: Acute on chronic cholecystitis with associated perihepatic collection. S/p perc chole drain and perihepatic drain placements 5/13 Wbc trending down 14.8 (16.2), afebrile on antibioitcs.  Drains still with good output, continue flushes and monitor output. Patient may be restarted on coumadin per Dr. Bonnielee Haff and if perihepatic drain still with output > 15 cc at discharge, IR will see patient in clinic within 1 week for removal without additional imaging.    LOS: 3 days    Berneta Levins PA-C 10/29/2013 12:36 PM

## 2013-10-30 DIAGNOSIS — A419 Sepsis, unspecified organism: Secondary | ICD-10-CM | POA: Diagnosis present

## 2013-10-30 DIAGNOSIS — K7460 Unspecified cirrhosis of liver: Secondary | ICD-10-CM

## 2013-10-30 DIAGNOSIS — J189 Pneumonia, unspecified organism: Secondary | ICD-10-CM

## 2013-10-30 LAB — CBC
HCT: 29.6 % — ABNORMAL LOW (ref 39.0–52.0)
HEMOGLOBIN: 9.8 g/dL — AB (ref 13.0–17.0)
MCH: 28.7 pg (ref 26.0–34.0)
MCHC: 33.1 g/dL (ref 30.0–36.0)
MCV: 86.5 fL (ref 78.0–100.0)
Platelets: 348 10*3/uL (ref 150–400)
RBC: 3.42 MIL/uL — ABNORMAL LOW (ref 4.22–5.81)
RDW: 16.3 % — AB (ref 11.5–15.5)
WBC: 13.5 10*3/uL — AB (ref 4.0–10.5)

## 2013-10-30 LAB — BODY FLUID CULTURE
Culture: NO GROWTH
Gram Stain: NONE SEEN

## 2013-10-30 LAB — BASIC METABOLIC PANEL
BUN: 13 mg/dL (ref 6–23)
CO2: 25 mEq/L (ref 19–32)
Calcium: 9 mg/dL (ref 8.4–10.5)
Chloride: 105 mEq/L (ref 96–112)
Creatinine, Ser: 1.1 mg/dL (ref 0.50–1.35)
GFR calc non Af Amer: 67 mL/min — ABNORMAL LOW (ref >90)
GFR, EST AFRICAN AMERICAN: 78 mL/min — AB (ref >90)
Glucose, Bld: 115 mg/dL — ABNORMAL HIGH (ref 70–99)
POTASSIUM: 5.1 meq/L (ref 3.7–5.3)
Sodium: 139 mEq/L (ref 137–147)

## 2013-10-30 LAB — GLUCOSE, CAPILLARY
GLUCOSE-CAPILLARY: 96 mg/dL (ref 70–99)
Glucose-Capillary: 160 mg/dL — ABNORMAL HIGH (ref 70–99)
Glucose-Capillary: 149 mg/dL — ABNORMAL HIGH (ref 70–99)
Glucose-Capillary: 159 mg/dL — ABNORMAL HIGH (ref 70–99)

## 2013-10-30 LAB — PROTIME-INR
INR: 1.72 — AB (ref 0.00–1.49)
PROTHROMBIN TIME: 19.7 s — AB (ref 11.6–15.2)

## 2013-10-30 MED ORDER — METOPROLOL TARTRATE 12.5 MG HALF TABLET
12.5000 mg | ORAL_TABLET | Freq: Two times a day (BID) | ORAL | Status: DC
  Administered 2013-10-30 – 2013-10-31 (×3): 12.5 mg via ORAL
  Filled 2013-10-30 (×4): qty 1

## 2013-10-30 MED ORDER — WARFARIN SODIUM 7.5 MG PO TABS
7.5000 mg | ORAL_TABLET | Freq: Once | ORAL | Status: AC
  Administered 2013-10-30: 7.5 mg via ORAL
  Filled 2013-10-30: qty 1

## 2013-10-30 MED ORDER — SODIUM POLYSTYRENE SULFONATE 15 GM/60ML PO SUSP
15.0000 g | Freq: Once | ORAL | Status: AC
  Administered 2013-10-30: 15 g via ORAL
  Filled 2013-10-30: qty 60

## 2013-10-30 NOTE — Progress Notes (Signed)
Subjective: Pt much better, WBC down.  No N/V, tolerating regular diet.  Still has pain in GB drain site.  Ambulating well.  Thankful for our care.  Objective: Vital signs in last 24 hours: Temp:  [97.4 F (36.3 C)-97.8 F (36.6 C)] 97.4 F (36.3 C) (05/16 0500) Pulse Rate:  [70-98] 70 (05/16 0500) Resp:  [18-20] 20 (05/16 0500) BP: (105-133)/(59-75) 133/75 mmHg (05/16 0500) SpO2:  [95 %-98 %] 95 % (05/16 0500) Weight:  [189 lb 9.6 oz (86.002 kg)] 189 lb 9.6 oz (86.002 kg) (05/16 0500) Last BM Date: 10/29/13  Intake/Output from previous day: 05/15 0701 - 05/16 0700 In: 910 [P.O.:360; IV Piggyback:550] Out: 260 [Urine:200; Drains:60] Intake/Output this shift:    PE: Gen:  Alert, NAD, pleasant Abd: Soft, NT/ND, +BS, no HSM, RUQ drain with dark brown output (50mL), Epigastric drain with minimal lighter brown output (10mL)   Lab Results:   Recent Labs  10/29/13 0559 10/30/13 0455  WBC 14.8* 13.5*  HGB 9.5* 9.8*  HCT 28.3* 29.6*  PLT 319 348   BMET  Recent Labs  10/29/13 0559 10/30/13 0455  NA 139 139  K 5.0 5.1  CL 105 105  CO2 25 25  GLUCOSE 125* 115*  BUN 15 13  CREATININE 1.09 1.10  CALCIUM 8.9 9.0   PT/INR  Recent Labs  10/29/13 0559 10/30/13 0455  LABPROT 20.3* 19.7*  INR 1.79* 1.72*   CMP     Component Value Date/Time   NA 139 10/30/2013 0455   K 5.1 10/30/2013 0455   CL 105 10/30/2013 0455   CO2 25 10/30/2013 0455   GLUCOSE 115* 10/30/2013 0455   BUN 13 10/30/2013 0455   CREATININE 1.10 10/30/2013 0455   CALCIUM 9.0 10/30/2013 0455   PROT 5.5* 10/29/2013 0559   ALBUMIN 2.1* 10/29/2013 0559   AST 33 10/29/2013 0559   ALT 26 10/29/2013 0559   ALKPHOS 75 10/29/2013 0559   BILITOT 0.4 10/29/2013 0559   GFRNONAA 67* 10/30/2013 0455   GFRAA 78* 10/30/2013 0455   Lipase     Component Value Date/Time   LIPASE 226* 10/25/2013 2224       Studies/Results: No results found.  Anti-infectives: Anti-infectives   Start     Dose/Rate Route  Frequency Ordered Stop   10/29/13 0800  vancomycin (VANCOCIN) IVPB 1000 mg/200 mL premix     1,000 mg 200 mL/hr over 60 Minutes Intravenous Every 12 hours 10/28/13 2039     10/26/13 2000  vancomycin (VANCOCIN) IVPB 750 mg/150 ml premix  Status:  Discontinued     750 mg 150 mL/hr over 60 Minutes Intravenous Every 12 hours 10/26/13 0709 10/28/13 2039   10/26/13 0715  vancomycin (VANCOCIN) IVPB 1000 mg/200 mL premix     1,000 mg 200 mL/hr over 60 Minutes Intravenous  Once 10/26/13 0709 10/26/13 1137   10/26/13 0715  piperacillin-tazobactam (ZOSYN) IVPB 3.375 g     3.375 g 12.5 mL/hr over 240 Minutes Intravenous Every 8 hours 10/26/13 0709     10/26/13 0315  piperacillin-tazobactam (ZOSYN) IVPB 3.375 g     3.375 g 12.5 mL/hr over 240 Minutes Intravenous  Once 10/26/13 0303 10/26/13 0837       Assessment/Plan Subacute Cholecystitis ? Perforated  Gallstone pancreatitis  Perihepatic fluid collection  Leukocytosis - improved to 13.5  Plan:  1. S/p IR perc chole drain and aspiration/drain placement to subcapsular liver fluid.  Stay in hospital on IV antibiotics until WBC is normalized then can be d/c home on  oral ABX.  Would give 3 weeks of ABX to last until appt time. 2. Continue drains for now (perihepatic drain may be discontinued prior to discharge), will need to keep perc chole drain for at least 6-8 weeks before interval cholecystectomy would be considered. He may be high risk for open cholecystectomy and would need to be cleared from cardiology prior to proceeding. Dr. Anne Fu arranging for f/u with Dr. Donnie Aho as OP. 3. Continue antibiotics (Vanc/Zosyn Day #5)  4. Continue anticoagulation 5. Ambulate and IS  6. SCD's 7. F/u in office in 2-3 weeks      LOS: 4 days    Spencer Shelton 10/30/2013, 7:58 AM Pager: [phone]

## 2013-10-30 NOTE — Progress Notes (Signed)
Overall better.  Keep perc cholecystostomy for 6 - 8 weeks.

## 2013-10-30 NOTE — Progress Notes (Signed)
  Subjective: Pt doing fairly well; denies N/V; tolerating diet; has minimal abd tenderness at drain sites; per pt drains are not being irrigated - will address with nursing Objective: Vital signs in last 24 hours: Temp:  [97.4 F (36.3 C)-97.8 F (36.6 C)] 97.4 F (36.3 C) (05/16 0500) Pulse Rate:  [70-98] 70 (05/16 0500) Resp:  [18-20] 20 (05/16 0500) BP: (105-133)/(59-75) 133/75 mmHg (05/16 0500) SpO2:  [95 %-98 %] 95 % (05/16 0500) Weight:  [189 lb 9.6 oz (86.002 kg)] 189 lb 9.6 oz (86.002 kg) (05/16 0500) Last BM Date: 10/29/13  Intake/Output from previous day: 05/15 0701 - 05/16 0700 In: 910 [P.O.:360; IV Piggyback:550] Out: 260 [Urine:200; Drains:60] Intake/Output this shift:    GB/perihepatic drains intact; both drains irrigated and draining blood-tinged bile/ light brown fluid respectively (60+/20+ cc's); insertion sites ok, mildly tender; cx's neg to date  Lab Results:   Recent Labs  10/29/13 0559 10/30/13 0455  WBC 14.8* 13.5*  HGB 9.5* 9.8*  HCT 28.3* 29.6*  PLT 319 348   BMET  Recent Labs  10/29/13 0559 10/30/13 0455  NA 139 139  K 5.0 5.1  CL 105 105  CO2 25 25  GLUCOSE 125* 115*  BUN 15 13  CREATININE 1.09 1.10  CALCIUM 8.9 9.0   PT/INR  Recent Labs  10/29/13 0559 10/30/13 0455  LABPROT 20.3* 19.7*  INR 1.79* 1.72*   ABG No results found for this basename: PHART, PCO2, PO2, HCO3,  in the last 72 hours  Studies/Results: No results found.  Anti-infectives: Anti-infectives   Start     Dose/Rate Route Frequency Ordered Stop   10/29/13 0800  vancomycin (VANCOCIN) IVPB 1000 mg/200 mL premix     1,000 mg 200 mL/hr over 60 Minutes Intravenous Every 12 hours 10/28/13 2039     10/26/13 2000  vancomycin (VANCOCIN) IVPB 750 mg/150 ml premix  Status:  Discontinued     750 mg 150 mL/hr over 60 Minutes Intravenous Every 12 hours 10/26/13 0709 10/28/13 2039   10/26/13 0715  vancomycin (VANCOCIN) IVPB 1000 mg/200 mL premix     1,000 mg 200  mL/hr over 60 Minutes Intravenous  Once 10/26/13 0709 10/26/13 1137   10/26/13 0715  piperacillin-tazobactam (ZOSYN) IVPB 3.375 g     3.375 g 12.5 mL/hr over 240 Minutes Intravenous Every 8 hours 10/26/13 0709     10/26/13 0315  piperacillin-tazobactam (ZOSYN) IVPB 3.375 g     3.375 g 12.5 mL/hr over 240 Minutes Intravenous  Once 10/26/13 0303 10/26/13 0837      Assessment/Plan: s/p Acute on chronic cholecystitis with associated perihepatic collection.  S/p perc chole drain and perihepatic drain placements 5/13  Wbc trending down 13.5(14.8), afebrile on antibioitcs.  Drains irrigated for first time today per pt although ordered at time of placement  Patient may be restarted on coumadin per Dr. Bonnielee Haff ; cont GB drain for 4-6 weeks unless surgery done in interim; cont perihepatic drain until output diminished despite flushes- once minimal can remove at our IR clinic [phone]) in about 1 week; upon discharge home would be ideal if GB drain were flushed daily with 5-10 cc's sterile NS and perihepatic drain with 5 cc's sterile NS daily along with output recording and dressing changes every 2 days   LOS: 4 days    Spencer Shelton 10/30/2013

## 2013-10-30 NOTE — Progress Notes (Signed)
[facility] PROGRESS NOTE   Spencer Houghony R Benko Sr. GNF:[id number] DOB: 04/21/1946 DOA: 10/26/2013 PCP: Pcp Not In System  HPI/Subjective: Percutaneous drain placed by interventional radiology yesterday. Patient seen with wife at bedside, feels much better today. Coumadin restarted, INR still subtherapeutic, but likely to discharge in a.m.   Assessment/Plan: Principal Problem:   Acute cholecystitis Active Problems:   Essential hypertension, benign   Atrial fibrillation   Chronic systolic heart failure   Biventricular implantable cardioverter-defibrillator in situ   Ventricular tachycardia (paroxysmal)   Abdominal pain, acute, right upper quadrant   Cholecystitis   Dehydration   Acute renal failure   PNA (pneumonia)   CAD (coronary artery disease), native coronary artery   Cirrhosis   Sepsis secondary to acute cholecystitis  -Patient had leukocytosis as well as tachypnea tach admission  -Continue IV antibiotics, vancomycin, Zosyn. -Leukocytosis, blood pressure and overall symptoms improving.  Abdominal pain secondary to acute cholecystitis  -Abdominal ultrasound shows cholelithiasis with gallbladder wall thickening, or a lesion of the right lobe of the liver. -[facility] [place] which showed hepatic fluid collection  -General surgery consulted, cardiology consulted on admission and cleared him for any upcoming procedure. -Interventional radiology consulted for percutaneous drainage  -Continue IV antibiotics, vancomycin and Zosyn.  Recent pneumonia -Patient treated for pneumonia recently, with Levaquin. -Chest x-ray showed right middle lobe pneumonia. Patient is on Zosyn and vancomycin.  Atrial fibrillation with supratherapeutic INR  -Currently on Coumadin  -Have the ICD implanted  -INR 7.8, patient given vitamin K to reverse.  -Restarted Coumadin today the, cardiology recommended to stop aspirin. -Digoxin currently  held, patient pending dig level   Chronic systolic heart failure  -Last documented EF was in 2004, 40%  -Dr. Donnie Ahoilley consulted  -Continue daily weights, monitoring of intake and output   Type 2 diabetes mellitus  -Actos held  -SSI  Hypertension  -Losartan, Lasix, benazepril held initially, restarted enalapril and Lasix.    Code Status: Full code Family Communication: Plan discussed with the patient. Disposition Plan: Remains inpatient   Consultants:  General surgery.  Interventional radiology.  Gastroenterology.  Cardiology.  Procedures:  Percutaneous biliary drain to be placed today  Antibiotics:  Vanc and Zosyn.   Objective: Filed Vitals:   10/30/13 0500  BP: 133/75  Pulse: 70  Temp: 97.4 F (36.3 C)  Resp: 20    Intake/Output Summary (Last 24 hours) at 10/30/13 1043 Last data filed at 10/30/13 0517  Gross per 24 hour  Intake    670 ml  Output     60 ml  Net    610 ml   Filed Weights   10/28/13 0410 10/29/13 0447 10/30/13 0500  Weight: 86 kg (189 lb 9.5 oz) 86.3 kg (190 lb 4.1 oz) 86.002 kg (189 lb 9.6 oz)    Exam: General: Alert and awake, oriented x3, not in any acute distress. HEENT: anicteric sclera, pupils reactive to light and accommodation, EOMI CVS: S1-S2 clear, no murmur rubs or gallops Chest: clear to auscultation bilaterally, no wheezing, rales or rhonchi Abdomen: soft nontender, nondistended, normal bowel sounds, no organomegaly Extremities: no cyanosis, clubbing or edema noted bilaterally Neuro: Cranial nerves II-XII intact, no focal neurological deficits  Data Reviewed: Basic Metabolic Panel:  Recent Labs Lab 10/25/13 2224 10/27/13 0450 10/28/13 0533 10/29/13 0559 10/30/13 0455  NA 127* 136* 140 139 139  K 4.6 4.4 5.0 5.0 5.1  CL 90* 99 104 105 105  CO2  26 26 26 25 25   GLUCOSE 209* 131* 132* 125* 115*  BUN 67* 41* 25* 15 13  CREATININE 1.66* 1.32 1.23 1.09 1.10  CALCIUM 8.6 8.4 8.6 8.9 9.0   Liver Function  Tests:  Recent Labs Lab 10/25/13 2224 10/27/13 0450 10/29/13 0559  AST 39* 39* 33  ALT 26 27 26   ALKPHOS 105 85 75  BILITOT 0.5 0.8 0.4  PROT 6.7 5.4* 5.5*  ALBUMIN 2.6* 2.1* 2.1*    Recent Labs Lab 10/25/13 2224  LIPASE 226*   No results found for this basename: AMMONIA,  in the last 168 hours CBC:  Recent Labs Lab 10/25/13 2224 10/27/13 0450 10/28/13 0533 10/29/13 0559 10/30/13 0455  WBC 28.9* 23.3* 16.2* 14.8* 13.5*  NEUTROABS 26.8*  --   --   --   --   HGB 11.0* 9.5* 9.5* 9.5* 9.8*  HCT 31.9* 27.4* 28.3* 28.3* 29.6*  MCV 83.7 83.8 85.0 85.8 86.5  PLT 338 305 327 319 348   Cardiac Enzymes: No results found for this basename: CKTOTAL, CKMB, CKMBINDEX, TROPONINI,  in the last 168 hours BNP (last 3 results) No results found for this basename: PROBNP,  in the last 8760 hours CBG:  Recent Labs Lab 10/29/13 0821 10/29/13 1253 10/29/13 1749 10/29/13 2206 10/30/13 0821  GLUCAP 114* 142* 152* 188* 96    Micro Recent Results (from the past 240 hour(s))  BODY FLUID CULTURE     Status: None   Collection Time    10/27/13  6:49 PM      Result Value Ref Range Status   Specimen Description BILE FLUID GALL BLADDER   Final   Special Requests NONE   Final   Gram Stain     Final   Value: NO WBC SEEN     NO ORGANISMS SEEN     Performed at [facility]   Culture     Final   Value: NO GROWTH 2 DAYS     Performed at [facility]   Report Status PENDING   Incomplete     Studies: No results found.  Scheduled Meds: . antiseptic oral rinse  15 mL Mouth Rinse q12n4p  . benazepril  20 mg Oral Daily  . chlorhexidine  15 mL Mouth Rinse BID  . furosemide  40 mg Oral Daily  . insulin aspart  0-15 Units Subcutaneous TID WC  . insulin aspart  0-5 Units Subcutaneous QHS  . metoprolol tartrate  12.5 mg Oral BID  . piperacillin-tazobactam (ZOSYN)  IV  3.375 g Intravenous Q8H  . vancomycin  1,000 mg Intravenous Q12H  . warfarin  7.5 mg Oral ONCE-1800  .  Warfarin - Pharmacist Dosing Inpatient   Does not apply q1800   Continuous Infusions:       Time spent: 35 minutes    Clydia Llano  [facility] Pager [phone] If 7PM-7AM, please contact night-coverage at [email or web address], password [facility] 10/30/2013, 10:43 AM  LOS: 4 days

## 2013-10-30 NOTE — Progress Notes (Signed)
    Subjective:  Feels better. No CP, no SOB  Objective:  Vital Signs in the last 24 hours: Temp:  [97.4 F (36.3 C)-97.8 F (36.6 C)] 97.4 F (36.3 C) (05/16 0500) Pulse Rate:  [70-98] 70 (05/16 0500) Resp:  [18-20] 20 (05/16 0500) BP: (105-133)/(59-75) 133/75 mmHg (05/16 0500) SpO2:  [95 %-98 %] 95 % (05/16 0500) Weight:  [189 lb 9.6 oz (86.002 kg)] 189 lb 9.6 oz (86.002 kg) (05/16 0500)  Intake/Output from previous day: 05/15 0701 - 05/16 0700 In: 910 [P.O.:360; IV Piggyback:550] Out: 260 [Urine:200; Drains:60]   Physical Exam: General: Well developed, well nourished, in no acute distress. Head:  Normocephalic and atraumatic. Lungs: Clear to auscultation and percussion. Heart: Normal S1 and S2.  No murmur, rubs or gallops. ICD Abdomen: soft, non-tender, positive bowel sounds. Distended. IR drains.  Extremities: No clubbing or cyanosis. No edema. Neurologic: Alert and oriented x 3.    Lab Results:  Recent Labs  10/29/13 0559 10/30/13 0455  WBC 14.8* 13.5*  HGB 9.5* 9.8*  PLT 319 348    Recent Labs  10/29/13 0559 10/30/13 0455  NA 139 139  K 5.0 5.1  CL 105 105  CO2 25 25  GLUCOSE 125* 115*  BUN 15 13  CREATININE 1.09 1.10     Recent Labs  10/29/13 0559  PROT 5.5*  ALBUMIN 2.1*  AST 33  ALT 26  ALKPHOS 75  BILITOT 0.4    . antiseptic oral rinse  15 mL Mouth Rinse q12n4p  . benazepril  20 mg Oral Daily  . chlorhexidine  15 mL Mouth Rinse BID  . furosemide  40 mg Oral Daily  . insulin aspart  0-15 Units Subcutaneous TID WC  . insulin aspart  0-5 Units Subcutaneous QHS  . metoprolol tartrate  12.5 mg Oral BID  . piperacillin-tazobactam (ZOSYN)  IV  3.375 g Intravenous Q8H  . vancomycin  1,000 mg Intravenous Q12H  . Warfarin - Pharmacist Dosing Inpatient   Does not apply q1800   Assessment/Plan:  Principal Problem:   Acute cholecystitis Active Problems:   Essential hypertension, benign   Atrial fibrillation   Chronic systolic heart  failure   Biventricular implantable cardioverter-defibrillator in situ   Ventricular tachycardia (paroxysmal)   Abdominal pain, acute, right upper quadrant   Cholecystitis   Dehydration   Acute renal failure   PNA (pneumonia)   CAD (coronary artery disease), native coronary artery   Cirrhosis  -EF 40% -IR placed drain -Restarted coumadin. Stay off ASA.  -benazapril 20 currently, restarted 5/15. Tolerating. Creat 1.1 -will add back low dose metoprolol 12.5 bid. Titrate. -BiV ICD normal -States that he has been Dr. York Spaniel patient for 34 years. He is greatful.   -Plan is for cholecystectomy in 6 weeks. Will need to see Dr. Donnie Aho prior. Will once again need to hold coumadin.   Mark Skains 10/30/2013, 8:05 AM

## 2013-10-30 NOTE — Progress Notes (Signed)
ANTICOAGULATION CONSULT NOTE - Follow-up Consult  Pharmacy Consult for warfarin Indication: atrial fibrillation  Allergies  Allergen Reactions  . Statins Other (See Comments)    "hurt all over"  . Fenofibrate Rash  . Penicillins Rash  . Sulfonamide Derivatives Rash    Patient Measurements: Height: 6' 3.98" (193 cm) Weight: 189 lb 9.6 oz (86.002 kg) IBW/kg (Calculated) : 86.76   Vital Signs: Temp: 97.4 F (36.3 C) (05/16 0500) Temp src: Oral (05/16 0500) BP: 133/75 mmHg (05/16 0500) Pulse Rate: 70 (05/16 0500)  Labs:  Recent Labs  10/27/13 1130  10/28/13 0533 10/29/13 0559 10/30/13 0455  HGB  --   < > 9.5* 9.5* 9.8*  HCT  --   --  28.3* 28.3* 29.6*  PLT  --   --  327 319 348  LABPROT 17.8*  --   --  20.3* 19.7*  INR 1.51*  --   --  1.79* 1.72*  CREATININE  --   --  1.23 1.09 1.10  < > = values in this interval not displayed.  Estimated Creatinine Clearance: 78.2 ml/min (by C-G formula based on Cr of 1.1).  Assessment: 75 yom admitted with acute cholecystitis on chronic coumadin for afib. Now s/p INR reversal with vitamin K for drain placement. Coumadin restarted 5/15. INR is subtherapeutic as expected at 1.72. H/H is stable and no bleeding noted. MD noted need for brand name only coumadin. This is what the hospital carries so pt will be continued on brand name coumadin.  Goal of Therapy:  INR 2-3   Plan:  1. Repeat Coumadin 7.5mg  PO x 1 tonight 2. Daily INR  Drake Leach Phallon Haydu 10/30/2013,10:27 AM

## 2013-10-30 NOTE — Progress Notes (Signed)
Patient ID: [name], male   DOB: 1945-09-28, 68 y.o.   MRN: [id number] [facility] Progress Note  Spencer GEWIRTZ Sr. 68 y.o. Jul 19, 1945   Subjective: Sitting up eating scrambled eggs for breakfast. Feels a lot better. Denies abdominal pain. Denies N/V. Very thankful for his care.  Objective: Vital signs: Filed Vitals:   10/30/13 0500  BP: 133/75  Pulse: 70  Temp: 97.4 F (36.3 C)  Resp: 20    Physical Exam: Gen: alert, no acute distress  Abd: drains noted (bandages not removed), soft, nontender, nondistended  Lab Results:  Recent Labs  10/29/13 0559 10/30/13 0455  NA 139 139  K 5.0 5.1  CL 105 105  CO2 25 25  GLUCOSE 125* 115*  BUN 15 13  CREATININE 1.09 1.10  CALCIUM 8.9 9.0    Recent Labs  10/29/13 0559  AST 33  ALT 26  ALKPHOS 75  BILITOT 0.4  PROT 5.5*  ALBUMIN 2.1*    Recent Labs  10/29/13 0559 10/30/13 0455  WBC 14.8* 13.5*  HGB 9.5* 9.8*  HCT 28.3* 29.6*  MCV 85.8 86.5  PLT 319 348      Assessment/Plan: 68 yo with acute cholecystitis and s/p cholecystostomy tube and also drain placed for perihepatic fluid collection. Clinically improving. WBC trending down. Surgery recs noted regarding timing of surgery and transition to oral Abx when WBC normal. Will sign off. Call if questions.   Spencer Shelton 10/30/2013, 9:20 AM

## 2013-10-31 ENCOUNTER — Inpatient Hospital Stay (HOSPITAL_COMMUNITY): Payer: Medicare Other

## 2013-10-31 LAB — BASIC METABOLIC PANEL
BUN: 14 mg/dL (ref 6–23)
CALCIUM: 8.9 mg/dL (ref 8.4–10.5)
CHLORIDE: 102 meq/L (ref 96–112)
CO2: 24 meq/L (ref 19–32)
Creatinine, Ser: 1.14 mg/dL (ref 0.50–1.35)
GFR calc Af Amer: 74 mL/min — ABNORMAL LOW (ref >90)
GFR calc non Af Amer: 64 mL/min — ABNORMAL LOW (ref >90)
GLUCOSE: 120 mg/dL — AB (ref 70–99)
Potassium: 4.2 mEq/L (ref 3.7–5.3)
SODIUM: 136 meq/L — AB (ref 137–147)

## 2013-10-31 LAB — PROTIME-INR
INR: 1.93 — ABNORMAL HIGH (ref 0.00–1.49)
Prothrombin Time: 21.5 seconds — ABNORMAL HIGH (ref 11.6–15.2)

## 2013-10-31 LAB — CBC
HCT: 29.6 % — ABNORMAL LOW (ref 39.0–52.0)
Hemoglobin: 9.8 g/dL — ABNORMAL LOW (ref 13.0–17.0)
MCH: 28.7 pg (ref 26.0–34.0)
MCHC: 33.1 g/dL (ref 30.0–36.0)
MCV: 86.5 fL (ref 78.0–100.0)
PLATELETS: 346 10*3/uL (ref 150–400)
RBC: 3.42 MIL/uL — ABNORMAL LOW (ref 4.22–5.81)
RDW: 16.3 % — ABNORMAL HIGH (ref 11.5–15.5)
WBC: 14.3 10*3/uL — ABNORMAL HIGH (ref 4.0–10.5)

## 2013-10-31 LAB — GLUCOSE, CAPILLARY
Glucose-Capillary: 100 mg/dL — ABNORMAL HIGH (ref 70–99)
Glucose-Capillary: 143 mg/dL — ABNORMAL HIGH (ref 70–99)
Glucose-Capillary: 94 mg/dL (ref 70–99)

## 2013-10-31 MED ORDER — IOHEXOL 300 MG/ML  SOLN
100.0000 mL | Freq: Once | INTRAMUSCULAR | Status: AC | PRN
  Administered 2013-10-31: 100 mL via INTRAVENOUS

## 2013-10-31 MED ORDER — AMOXICILLIN-POT CLAVULANATE 875-125 MG PO TABS: 1.0000 | ORAL_TABLET | Freq: Two times a day (BID) | ORAL | Status: DC

## 2013-10-31 MED ORDER — HYDROCODONE-ACETAMINOPHEN 5-325 MG PO TABS: 1.0000 | ORAL_TABLET | Freq: Four times a day (QID) | ORAL | Status: DC | PRN

## 2013-10-31 MED ORDER — IOHEXOL 300 MG/ML  SOLN
25.0000 mL | INTRAMUSCULAR | Status: AC
  Administered 2013-10-31 (×2): 25 mL via ORAL

## 2013-10-31 NOTE — Progress Notes (Signed)
Subjective: Pt feels good.  Tolerating diet.  Having BM's and flatus.  Pain much improved.  Notes drain output of epigastric (perihepatic) drain is minimal.  GB drainage still high.  Wants to go home.  Objective: Vital signs in last 24 hours: Temp:  [97.6 F (36.4 C)-97.9 F (36.6 C)] 97.9 F (36.6 C) (05/17 0457) Pulse Rate:  [67-69] 67 (05/17 0457) Resp:  [18-19] 18 (05/17 0457) BP: (112-124)/(65-68) 124/68 mmHg (05/17 0457) SpO2:  [97 %-98 %] 97 % (05/17 0457) Weight:  [186 lb 1.6 oz (84.414 kg)] 186 lb 1.6 oz (84.414 kg) (05/17 0457) Last BM Date: 10/30/13  Intake/Output from previous day: 05/16 0701 - 05/17 0700 In: 610 [P.O.:360; IV Piggyback:250] Out: 170 [Drains:170] Intake/Output this shift:    PE: Gen:  Alert, NAD, pleasant Abd: Soft, NT/ND, +BS, no HSM, RUQ drain with dark brown output (155mL), Epigastric drain with minimal yellow/lt brown output (15mL)    Lab Results:   Recent Labs  10/30/13 0455 10/31/13 0353  WBC 13.5* 14.3*  HGB 9.8* 9.8*  HCT 29.6* 29.6*  PLT 348 346   BMET  Recent Labs  10/30/13 0455 10/31/13 0353  NA 139 136*  K 5.1 4.2  CL 105 102  CO2 25 24  GLUCOSE 115* 120*  BUN 13 14  CREATININE 1.10 1.14  CALCIUM 9.0 8.9   PT/INR  Recent Labs  10/30/13 0455 10/31/13 0353  LABPROT 19.7* 21.5*  INR 1.72* 1.93*   CMP     Component Value Date/Time   NA 136* 10/31/2013 0353   K 4.2 10/31/2013 0353   CL 102 10/31/2013 0353   CO2 24 10/31/2013 0353   GLUCOSE 120* 10/31/2013 0353   BUN 14 10/31/2013 0353   CREATININE 1.14 10/31/2013 0353   CALCIUM 8.9 10/31/2013 0353   PROT 5.5* 10/29/2013 0559   ALBUMIN 2.1* 10/29/2013 0559   AST 33 10/29/2013 0559   ALT 26 10/29/2013 0559   ALKPHOS 75 10/29/2013 0559   BILITOT 0.4 10/29/2013 0559   GFRNONAA 64* 10/31/2013 0353   GFRAA 74* 10/31/2013 0353   Lipase     Component Value Date/Time   LIPASE 226* 10/25/2013 2224       Studies/Results: No results  found.  Anti-infectives: Anti-infectives   Start     Dose/Rate Route Frequency Ordered Stop   10/29/13 0800  vancomycin (VANCOCIN) IVPB 1000 mg/200 mL premix     1,000 mg 200 mL/hr over 60 Minutes Intravenous Every 12 hours 10/28/13 2039     10/26/13 2000  vancomycin (VANCOCIN) IVPB 750 mg/150 ml premix  Status:  Discontinued     750 mg 150 mL/hr over 60 Minutes Intravenous Every 12 hours 10/26/13 0709 10/28/13 2039   10/26/13 0715  vancomycin (VANCOCIN) IVPB 1000 mg/200 mL premix     1,000 mg 200 mL/hr over 60 Minutes Intravenous  Once 10/26/13 0709 10/26/13 1137   10/26/13 0715  piperacillin-tazobactam (ZOSYN) IVPB 3.375 g     3.375 g 12.5 mL/hr over 240 Minutes Intravenous Every 8 hours 10/26/13 0709     10/26/13 0315  piperacillin-tazobactam (ZOSYN) IVPB 3.375 g     3.375 g 12.5 mL/hr over 240 Minutes Intravenous  Once 10/26/13 0303 10/26/13 0837       Assessment/Plan Subacute Cholecystitis ? Perforated  Gallstone pancreatitis  Perihepatic fluid collection  Leukocytosis - worsened to 14.3 today   Plan:  1. S/p IR perc chole drain and aspiration/drain placement to subcapsular liver fluid. Stay in hospital on IV antibiotics until  WBC is normalized then can be d/c home on oral ABX. Would give 3 weeks of ABX to last until appt time.  2. Continue drains for now (perihepatic drain may be discontinued prior to discharge), will need to keep perc chole drain for at least 6-8 weeks before interval cholecystectomy would be considered. May be able to d/c perihepatic drain today - will check with Dr. Luisa Hart.  He may be high risk for open cholecystectomy and would need to be cleared from cardiology prior to proceeding. Dr. Anne Fu arranging for f/u with Dr. Donnie Aho as OP.  3. Continue antibiotics (Vanc/Zosyn Day #6), NGTD on culture 4. Continue anticoagulation  5. Ambulate and IS  6. SCD's  7. F/u in office in 2-3 weeks     LOS: 5 days    Spencer Shelton 10/31/2013, 9:11 AM Pager:  [phone]

## 2013-10-31 NOTE — Progress Notes (Signed)
10/31/13 Patient being discharged home with wife. IV site removed, Discharge instructions reviewed with patient and family.

## 2013-10-31 NOTE — Progress Notes (Signed)
Both drains have been flushed. 10cc each. Will continue to monitor

## 2013-10-31 NOTE — Progress Notes (Signed)
WBC up but clinically doing well. Check CT to verify catheters are working well.  If no change,  Can go home today on PO ABX AUGMENTIN.  Follow up with  Dr Dwain Sarna in 1 week.

## 2013-10-31 NOTE — Discharge Summary (Signed)
Physician Discharge Summary  Spencer Shelton. ZOX:[id number] DOB: 03/14/1946 DOA: 10/26/2013  PCP: Pcp Not In System  Admit date: 10/26/2013 Discharge date: 10/31/2013  Time spent: *40* minutes  Recommendations for Outpatient Follow-up:  1. Followup with Dr. Donnie Ahoilley prior to cholecystectomy surgery. 2. Followup with [facility] in 2-3 days 3. Followup with [facility]. 4. Followup with interventional radiology.  Discharge Diagnoses:  Principal Problem:   Sepsis Active Problems:   Essential hypertension, benign   Atrial fibrillation   Chronic systolic heart failure   Biventricular implantable cardioverter-defibrillator in situ   Ventricular tachycardia (paroxysmal)   Abdominal pain, acute, right upper quadrant   Acute cholecystitis   Dehydration   Acute renal failure   PNA (pneumonia)   CAD (coronary artery disease), native coronary artery   Cirrhosis   Discharge Condition: Stable  Diet recommendation: You were  Filed Weights   10/29/13 0447 10/30/13 0500 10/31/13 0457  Weight: 86.3 kg (190 lb 4.1 oz) 86.002 kg (189 lb 9.6 oz) 84.414 kg (186 lb 1.6 oz)    History of present illness:  68 yo male with 10days of worsening abdominal pain. [place]. Has seen several doctors there, was dx with pna last week and put on levaquin and cipro. His abd pain cont to worsen. Then yesterday they did a ct scan and he was called today and told he had a bad gallbladder. Pt called dr Donnie Ahotilley whom he has seen for over 30 years, who instructed him to drive to [place] to have his surgery. He has not eaten in days. He feels awful. No n/v. Chills, subj fevers. No swelling in his legs.  Hospital Course:   Sepsis secondary to acute cholecystitis  -Patient had leukocytosis as well as tachypnea tach admission  -Continue IV antibiotics, vancomycin, Zosyn.  -Leukocytosis, blood pressure and overall symptoms improving.  -Patient had 5 days of  vancomycin and Zosyn in the hospital, general surgery recommended total of 3 weeks of antibiotics. -Patient discharged in Aug[place] for 16 more days.  -Patient listed as allergic to penicillin, tolerated Zosyn very well while he is in the hospital, discharged on Augmentin.  Abdominal pain secondary to acute cholecystitis  -Abdominal ultrasound shows cholelithiasis with gallbladder wall thickening, or a lesion of the right lobe of the liver.  -[place] [facility] [place] which showed hepatic fluid collection  -General surgery consulted, cardiology consulted on admission and cleared him for any upcoming procedure.  -Interventional radiology consulted and 2 percutaneous drains placed. -One of the drains is a biliary drain, the other one was placed for fluids around the liver, this should be discontinued in less than one week by interventional radiology.  Recent pneumonia  -Patient treated for pneumonia recently, with Levaquin.  -Chest x-ray showed right middle lobe pneumonia. Patient is on Zosyn and vancomycin.   Atrial fibrillation with supratherapeutic INR  -Currently on Coumadin  -Have the ICD implanted  -INR 7.8 on presentation, patient given vitamin K to reverse.  -Restarted Coumadin today the, cardiology recommended to stop aspirin and digoxin.  -Coumadin restarted prior to discharge, on the day of discharge INR is 1.93. Patient to followup with the Coumadin clinic.  Chronic systolic heart failure  -Last documented EF was in 2004, 40%  -Daily weights and intake/output was monitored during this hospital stay. No evidence of decompensation.  Type 2 diabetes mellitus  -Actos held  -SSI, while he  was in the hospital, who medication restarted on discharge.   Hypertension  -Losartan, Lasix, benazepril held initially, restarted enalapril and Lasix.    Procedures:  Placement of 2 percutaneous drains, please see cardiology  notes.  Consultations:  Interventional radiology.  Cardiology.  General surgery.  Gastroenterology.  Discharge Exam: Filed Vitals:   10/31/13 0457  BP: 124/68  Pulse: 67  Temp: 97.9 F (36.6 C)  Resp: 18    General: Alert and awake, oriented x3, not in any acute distress. HEENT: anicteric sclera, pupils reactive to light and accommodation, EOMI CVS: S1-S2 clear, no murmur rubs or gallops Chest: clear to auscultation bilaterally, no wheezing, rales or rhonchi Abdomen: soft nontender, nondistended, normal bowel sounds, no organomegaly Extremities: no cyanosis, clubbing or edema noted bilaterally Neuro: Cranial nerves II-XII intact, no focal neurological deficits  Discharge Instructions You were cared for by a hospitalist during your hospital stay. If you have any questions about your discharge medications or the care you received while you were in the hospital after you are discharged, you can call the unit and asked to speak with the hospitalist on call if the hospitalist that took care of you is not available. Once you are discharged, your primary care physician will handle any further medical issues. Please note that NO REFILLS for any discharge medications will be authorized once you are discharged, as it is imperative that you return to your primary care physician (or establish a relationship with a primary care physician if you do not have one) for your aftercare needs so that they can reassess your need for medications and monitor your lab values.  Discharge Instructions   Diet - low sodium heart healthy    Complete by:  As directed      Increase activity slowly    Complete by:  As directed             Medication List    STOP taking these medications       aspirin 81 MG tablet     ciprofloxacin 500 MG tablet  Commonly known as:  CIPRO     digoxin 0.25 MG tablet  Commonly known as:  LANOXIN      TAKE these medications       ALPHAGAN P 0.1 % Soln  Generic  drug:  brimonidine  Place 1 drop into both eyes 2 (two) times daily.     amoxicillin-clavulanate 875-125 MG per tablet  Commonly known as:  AUGMENTIN  Take 1 tablet by mouth 2 (two) times daily.     B-12 DOTS 500 MCG Tbdp  Generic drug:  Cyanocobalamin  Take 500 mg by mouth daily.     benazepril 40 MG tablet  Commonly known as:  LOTENSIN  Take 20 mg by mouth 2 (two) times daily.     CoQ10 100 MG Caps  Take 1 capsule by mouth daily.     ezetimibe 10 MG tablet  Commonly known as:  ZETIA  Take 10 mg by mouth daily.     furosemide 40 MG tablet  Commonly known as:  LASIX  Take 40 mg by mouth 2 (two) times daily.     HYDROcodone-acetaminophen 5-325 MG per tablet  Commonly known as:  NORCO/VICODIN  Take 1 tablet by mouth every 6 (six) hours as needed for moderate pain.     metoprolol 50 MG tablet  Commonly known as:  LOPRESSOR  Take 50 mg by mouth 2 (two) times daily.     multivitamin with minerals  tablet  Take 1 tablet by mouth daily.     pioglitazone 15 MG tablet  Commonly known as:  ACTOS  Take 15 mg by mouth daily.     TRAVATAN Z 0.004 % Soln ophthalmic solution  Generic drug:  Travoprost (BAK Free)  Place 1 drop into both eyes at bedtime.     ursodiol 300 MG capsule  Commonly known as:  ACTIGALL  Take 300 mg by mouth 2 (two) times daily.     valsartan 160 MG tablet  Commonly known as:  DIOVAN  Take 80 mg by mouth 2 (two) times daily.     warfarin 5 MG tablet  Commonly known as:  COUMADIN  Take 5 mg by mouth daily.       Allergies  Allergen Reactions  . Statins Other (See Comments)    "hurt all over"  . Fenofibrate Rash  . Penicillins Rash  . Sulfonamide Derivatives Rash       Follow-up Information   Follow up with TILLEY JR,W SPENCER, MD In 2 weeks.   Specialty:  Cardiology   Contact information:   [address] [place] [address] [place] [address] [phone]       Follow up with HOSS,ART A, MD In 1 week.   Specialty:  Interventional  Radiology   Contact information:   [address] [place] [address] [phone]       Follow up with Emelia Loron, MD In 3 weeks.   Specialty:  General Surgery   Contact information:   [address] [place] [address] [phone]        The results of significant diagnostics from this hospitalization (including imaging, microbiology, ancillary and laboratory) are listed below for reference.    Significant Diagnostic Studies: Dg Chest 2 View  10/26/2013   CLINICAL DATA:  Recent pneumonia  EXAM: CHEST  2 VIEW  COMPARISON:  10/09/2013  FINDINGS: Cardiac shadow is stable. A pacing device is again noted. Chronic left pleural effusion is again noted. Scarring is seen in the left lung base. Some new right basilar and density is noted which projects in the right middle lobe. No other focal abnormality is noted.  IMPRESSION: Chronic changes in the left base.  New right basilar infiltrate projecting in the right middle lobe on the lateral projection.   Electronically Signed   By: Alcide Clever M.D.   On: 10/26/2013 01:41   Dg Chest 2 View  10/09/2013   CLINICAL DATA:  Pacemaker revision  EXAM: CHEST  2 VIEW  COMPARISON:  Prior chest x-ray 09/02/2006  FINDINGS: Interval revision of the cardiac rhythm maintenance device generator in the left chest. Stable position of left ventricular, atrial and right ventricular leads with introduction of a second right ventricular lead. Patient is status post median sternotomy. Interval fracture of the superior most sternal wire. Stable mild cardiomegaly. Atherosclerotic calcifications noted in the transverse aorta. No pneumothorax. Persistence left pleural effusion and associated left basilar atelectasis dating back to 2008. The right lung remains clear. No acute osseous abnormality.  IMPRESSION: 1. Revision of left subclavian approach biventricular cardiac rhythm maintenance device without evidence  of complication. 2. Stable chronic left lower lobe pleural effusion Ganz chronic atelectasis versus pleural parenchymal scarring dating back to 2008. 3. Incidental note is made of a interval fracture of the superior most sternotomy wire. a   Electronically Signed   By: Malachy Moan M.D.   On: 10/09/2013  08:30   US Abdomen Complete  10/26/2013   CLINICAL DATA:  Abdominal pain  EXAM: ULTRASOUND ABDOMEN COMPLETE  COMPARISON:  09/16/2013  FINDINGS: Gallbladder:  Multiple gallstones are again identified. The wall is thickened 8.6 mm although no sonographic Eulah Pont sign is seen.  Common bile duct:  Diameter: 6 mm  Liver:  There is a septated lesion identified within the right lobe of the liver. This was not well appreciated on the prior exam no more on a prior CT from 2008.  IVC:  No abnormality visualized.  Pancreas:  Visualized portion unremarkable.  Spleen:  Size and appearance within normal limits.  Right Kidney:  Length: 12.6 cm. Echogenicity within normal limits. No mass or hydronephrosis visualized.  Left Kidney:  Length: 12.3 cm.  2.9 cm cyst within the lower pole of left kidney.  Abdominal aorta:  No aneurysm visualized.  Other findings:  None.  IMPRESSION: Cholelithiasis with gallbladder wall thickening.  Septated lesion within the right lobe of the liver which was not well appreciated on the prior study. This measures approximately 5 cm in greatest dimension. This may simply represent a septated cyst. Further evaluation is recommended.   Electronically Signed   By: Alcide Clever M.D.   On: 10/26/2013 02:59   Ir Perc Cholecystostomy  10/28/2013   CLINICAL DATA:  68 year old male with acute cholecystitis and a loculated subcapsular perihepatic fluid collection concerning for biloma versus abscess. He presented super therapeutic on Coumadin with an INR above 7. INR has now decreased to 1.5 and patient is a candidate for percutaneous drainage.  EXAM: CHOLECYSTOSTOMY; IR ULTRASOUND GUIDANCE; IR IMAGE GUIDED  DRAINAGE PERCUT CATH PERITONEAL RETROPERIT  Date: 10/28/2013  PROCEDURE: 1. Ultrasound-guided transhepatic puncture of the gallbladder 2. Placement of a 10 French percutaneous cholecystostomy tube under fluoroscopic guidance 3. Ultrasound-guided aspiration of subcapsular perihepatic fluid 4. Placement of a 10 French percutaneous drain under fluoroscopic guidance into the subcapsular perihepatic fluid collection Interventional Radiologist:  Sterling Big, MD  ANESTHESIA/SEDATION: Moderate (conscious) sedation was used. Two mg Versed, 62.5 mcg Fentanyl were administered intravenously. The patient's vital signs were monitored continuously by radiology nursing throughout the procedure.  Sedation Time: 30 minutes  FLUOROSCOPY TIME:  2 min 6 seconds  CONTRAST:  15mL OMNIPAQUE IOHEXOL 300 MG/ML  SOLN  TECHNIQUE: Informed consent was obtained from the patient following explanation of the procedure, risks, benefits and alternatives. The patient understands, agrees and consents for the procedure. All questions were addressed. A time out was performed.  Maximal barrier sterile technique utilized including caps, mask, sterile gowns, sterile gloves, large sterile drape, hand hygiene, and Betadine skin prep.  The right upper quadrant was interrogated with ultrasound. The gallbladder is distended, thick walled and contains multiple gallstones consistent with acute cholecystitis. Additionally, there is a loculated fluid collection in the subcapsular space inferior to the left hepatic lobe. No direct communication is identified between the gallbladder in the loculated subhepatic fluid collection.  Attention was first turned to the gallbladder. Using a right lateral intercostal approach, local anesthesia was attained by infiltration with 1% lidocaine and a small dermatotomy was made. Under real-time sonographic guidance, a 21 gauge Accustick needle was advanced along a transhepatic course and into the gallbladder lumen. The  Accustick sheath was advanced over the micro wire into the gallbladder lumen. A gentle hand injection of contrast material confirmed location within the gallbladder. An Amplatz wire was then coiled within the gallbladder lumen, the tract dilated and a [employer] [place] all-purpose drain advanced over  the wire and formed with the locking pigtail in the gallbladder lumen. Approximately 60 mL of thick, black bile was aspirated. A sample was sent for culture. The catheter was flushed, secured to the skin with 0-Prolene suture and connected to gravity bag for drainage.  Attention was next turned to the subcapsular perihepatic fluid collection. Using a subcostal right upper quadrant approach, the skin was again anesthetized with 1% lidocaine a small dermatotomy made. Under real-time sonographic guidance, an 18 gauge trocar needle was advanced into the subcapsular fluid collection. Aspiration confirmed thick black bile consistent with biloma. Therefore, an Amplatz wire was advanced in the collection, the tract dilated and a Adriana Simas 10 [place] all-purpose drain formed within the fluid collection. A gentle hand injection of contrast material through the tube confirmed dislocation fluoroscopically. Approximately 45 mL of thick black bile was aspirated. The catheter was flushed, secured to the skin with 0 Prolene suture and connected to gravity bag for drainage.  COMPLICATIONS: None immediate.  IMPRESSION: 1. Successful placement of a 10 French transhepatic percutaneous cholecystostomy tube. 60 mL of thick black bile was aspirated. A sample was sent for culture. The tube is left to gravity bag drainage. 2. Ultrasound-guided aspiration of the loculated subcapsular perihepatic fluid collection yielded thick, black bile consistent with a biloma. Therefore, a second 10 French drain was placed within this collection and approximately 45 mL of bile was aspirated. This drain was also left to gravity bag drainage. Plan: The subcostal right  upper quadrant drain within in the subcapsular perihepatic fluid collection may be ready for removal in several days once output has become minimal. The right lateral intercostal percutaneous cholecystostomy tube will need to stay in place for a minimum of 4-6 weeks. Ideally, if the patient becomes a surgical candidate the tube can be removed at the time of cholecystectomy.  Signed,  Sterling Big, MD  [facility]  [facility]   Electronically Signed   By: Malachy Moan M.D.   On: 10/28/2013 08:03   Ir Image Guided Drainage Percut Cath  Peritoneal Retroperit  10/28/2013   CLINICAL DATA:  68 year old male with acute cholecystitis and a loculated subcapsular perihepatic fluid collection concerning for biloma versus abscess. He presented super therapeutic on Coumadin with an INR above 7. INR has now decreased to 1.5 and patient is a candidate for percutaneous drainage.  EXAM: CHOLECYSTOSTOMY; IR ULTRASOUND GUIDANCE; IR IMAGE GUIDED DRAINAGE PERCUT CATH PERITONEAL RETROPERIT  Date: 10/28/2013  PROCEDURE: 1. Ultrasound-guided transhepatic puncture of the gallbladder 2. Placement of a 10 French percutaneous cholecystostomy tube under fluoroscopic guidance 3. Ultrasound-guided aspiration of subcapsular perihepatic fluid 4. Placement of a 10 French percutaneous drain under fluoroscopic guidance into the subcapsular perihepatic fluid collection Interventional Radiologist:  Sterling Big, MD  ANESTHESIA/SEDATION: Moderate (conscious) sedation was used. Two mg Versed, 62.5 mcg Fentanyl were administered intravenously. The patient's vital signs were monitored continuously by radiology nursing throughout the procedure.  Sedation Time: 30 minutes  FLUOROSCOPY TIME:  2 min 6 seconds  CONTRAST:  6mL OMNIPAQUE IOHEXOL 300 MG/ML  SOLN  TECHNIQUE: Informed consent was obtained from the patient following explanation of the procedure, risks, benefits and alternatives. The  patient understands, agrees and consents for the procedure. All questions were addressed. A time out was performed.  Maximal barrier sterile technique utilized including caps, mask, sterile gowns, sterile gloves, large sterile drape, hand hygiene, and Betadine skin prep.  The right upper quadrant was interrogated with ultrasound. The gallbladder is distended, thick  walled and contains multiple gallstones consistent with acute cholecystitis. Additionally, there is a loculated fluid collection in the subcapsular space inferior to the left hepatic lobe. No direct communication is identified between the gallbladder in the loculated subhepatic fluid collection.  Attention was first turned to the gallbladder. Using a right lateral intercostal approach, local anesthesia was attained by infiltration with 1% lidocaine and a small dermatotomy was made. Under real-time sonographic guidance, a 21 gauge Accustick needle was advanced along a transhepatic course and into the gallbladder lumen. The Accustick sheath was advanced over the micro wire into the gallbladder lumen. A gentle hand injection of contrast material confirmed location within the gallbladder. An Amplatz wire was then coiled within the gallbladder lumen, the tract dilated and a [employer] 10 [place] all-purpose drain advanced over the wire and formed with the locking pigtail in the gallbladder lumen. Approximately 60 mL of thick, black bile was aspirated. A sample was sent for culture. The catheter was flushed, secured to the skin with 0-Prolene suture and connected to gravity bag for drainage.  Attention was next turned to the subcapsular perihepatic fluid collection. Using a subcostal right upper quadrant approach, the skin was again anesthetized with 1% lidocaine a small dermatotomy made. Under real-time sonographic guidance, an 18 gauge trocar needle was advanced into the subcapsular fluid collection. Aspiration confirmed thick black bile consistent with biloma.  Therefore, an Amplatz wire was advanced in the collection, the tract dilated and a Adriana Simas 10 [place] all-purpose drain formed within the fluid collection. A gentle hand injection of contrast material through the tube confirmed dislocation fluoroscopically. Approximately 45 mL of thick black bile was aspirated. The catheter was flushed, secured to the skin with 0 Prolene suture and connected to gravity bag for drainage.  COMPLICATIONS: None immediate.  IMPRESSION: 1. Successful placement of a 10 French transhepatic percutaneous cholecystostomy tube. 60 mL of thick black bile was aspirated. A sample was sent for culture. The tube is left to gravity bag drainage. 2. Ultrasound-guided aspiration of the loculated subcapsular perihepatic fluid collection yielded thick, black bile consistent with a biloma. Therefore, a second 10 French drain was placed within this collection and approximately 45 mL of bile was aspirated. This drain was also left to gravity bag drainage. Plan: The subcostal right upper quadrant drain within in the subcapsular perihepatic fluid collection may be ready for removal in several days once output has become minimal. The right lateral intercostal percutaneous cholecystostomy tube will need to stay in place for a minimum of 4-6 weeks. Ideally, if the patient becomes a surgical candidate the tube can be removed at the time of cholecystectomy.  Signed,  Sterling Big, MD  [facility]  [facility]   Electronically Signed   By: Malachy Moan M.D.   On: 10/28/2013 08:03    Microbiology: Recent Results (from the past 240 hour(s))  BODY FLUID CULTURE     Status: None   Collection Time    10/27/13  6:49 PM      Result Value Ref Range Status   Specimen Description BILE FLUID GALL BLADDER   Final   Special Requests NONE   Final   Gram Stain     Final   Value: NO WBC SEEN     NO ORGANISMS SEEN     Performed at [facility]   Culture      Final   Value: NO GROWTH 3 DAYS     Performed at [facility]  Report Status 10/30/2013 FINAL   Final     Labs: Basic Metabolic Panel:  Recent Labs Lab 10/27/13 0450 10/28/13 0533 10/29/13 0559 10/30/13 0455 10/31/13 0353  NA 136* 140 139 139 136*  K 4.4 5.0 5.0 5.1 4.2  CL 99 104 105 105 102  CO2 26 26 25 25 24   GLUCOSE 131* 132* 125* 115* 120*  BUN 41* 25* 15 13 14   CREATININE 1.32 1.23 1.09 1.10 1.14  CALCIUM 8.4 8.6 8.9 9.0 8.9   Liver Function Tests:  Recent Labs Lab 10/25/13 2224 10/27/13 0450 10/29/13 0559  AST 39* 39* 33  ALT 26 27 26   ALKPHOS 105 85 75  BILITOT 0.5 0.8 0.4  PROT 6.7 5.4* 5.5*  ALBUMIN 2.6* 2.1* 2.1*    Recent Labs Lab 10/25/13 2224  LIPASE 226*   No results found for this basename: AMMONIA,  in the last 168 hours CBC:  Recent Labs Lab 10/25/13 2224 10/27/13 0450 10/28/13 0533 10/29/13 0559 10/30/13 0455 10/31/13 0353  WBC 28.9* 23.3* 16.2* 14.8* 13.5* 14.3*  NEUTROABS 26.8*  --   --   --   --   --   HGB 11.0* 9.5* 9.5* 9.5* 9.8* 9.8*  HCT 31.9* 27.4* 28.3* 28.3* 29.6* 29.6*  MCV 83.7 83.8 85.0 85.8 86.5 86.5  PLT 338 305 327 319 348 346   Cardiac Enzymes: No results found for this basename: CKTOTAL, CKMB, CKMBINDEX, TROPONINI,  in the last 168 hours BNP: BNP (last 3 results) No results found for this basename: PROBNP,  in the last 8760 hours CBG:  Recent Labs Lab 10/30/13 0821 10/30/13 1228 10/30/13 1704 10/30/13 2123 10/31/13 0757  GLUCAP 96 160* 149* 159* 100*       Signed:  Lataysha Vohra  [facility] 10/31/2013, 9:36 AM

## 2013-10-31 NOTE — Progress Notes (Signed)
Both drains have been flushed. 10cc each. Will continue to monitor 

## 2013-11-01 ENCOUNTER — Other Ambulatory Visit (HOSPITAL_COMMUNITY): Payer: Self-pay | Admitting: Interventional Radiology

## 2013-11-01 DIAGNOSIS — K819 Cholecystitis, unspecified: Secondary | ICD-10-CM

## 2013-11-09 ENCOUNTER — Ambulatory Visit (HOSPITAL_COMMUNITY)
Admission: RE | Admit: 2013-11-09 | Discharge: 2013-11-09 | Disposition: A | Discharge status: Home / Self Care | Payer: Medicare Other | Source: Ambulatory Visit | Attending: Interventional Radiology | Admitting: Interventional Radiology

## 2013-11-09 ENCOUNTER — Other Ambulatory Visit (HOSPITAL_COMMUNITY): Payer: Self-pay | Admitting: Interventional Radiology

## 2013-11-09 DIAGNOSIS — K819 Cholecystitis, unspecified: Secondary | ICD-10-CM

## 2013-11-09 DIAGNOSIS — Z438 Encounter for attention to other artificial openings: Secondary | ICD-10-CM | POA: Insufficient documentation

## 2013-11-09 MED ORDER — IOHEXOL 300 MG/ML  SOLN: 50.0000 mL | Freq: Once | INTRAMUSCULAR | Status: AC | PRN

## 2013-11-22 ENCOUNTER — Ambulatory Visit (INDEPENDENT_AMBULATORY_CARE_PROVIDER_SITE_OTHER): Payer: Medicare Other | Admitting: *Deleted

## 2013-11-22 DIAGNOSIS — I4729 Other ventricular tachycardia: Secondary | ICD-10-CM

## 2013-11-22 DIAGNOSIS — I472 Ventricular tachycardia: Secondary | ICD-10-CM

## 2013-11-22 DIAGNOSIS — I5022 Chronic systolic (congestive) heart failure: Secondary | ICD-10-CM

## 2013-11-22 LAB — MDC_IDC_ENUM_SESS_TYPE_INCLINIC
Battery Voltage: 3.07 V
Brady Statistic AP VP Percent: 0 %
Brady Statistic AP VS Percent: 0 %
Brady Statistic AS VP Percent: 94.54 %
Brady Statistic RA Percent Paced: 0 %
Brady Statistic RV Percent Paced: 95.45 %
Date Time Interrogation Session: 20150608160251
HighPow Impedance: 190 Ohm
HighPow Impedance: 57 Ohm
Lead Channel Impedance Value: 4047 Ohm
Lead Channel Impedance Value: 4047 Ohm
Lead Channel Impedance Value: 418 Ohm
Lead Channel Impedance Value: 532 Ohm
Lead Channel Pacing Threshold Amplitude: 0.75 V
Lead Channel Pacing Threshold Amplitude: 0.75 V
Lead Channel Pacing Threshold Pulse Width: 0.4 ms
Lead Channel Sensing Intrinsic Amplitude: 0.5 mV
Lead Channel Setting Pacing Amplitude: 2.25 V
Lead Channel Setting Pacing Pulse Width: 0.4 ms
Lead Channel Setting Pacing Pulse Width: 0.4 ms
Lead Channel Setting Sensing Sensitivity: 0.3 mV
MDC IDC MSMT BATTERY REMAINING LONGEVITY: 98 mo
MDC IDC MSMT LEADCHNL LV IMPEDANCE VALUE: 456 Ohm
MDC IDC MSMT LEADCHNL RA SENSING INTR AMPL: 0.625 mV
MDC IDC MSMT LEADCHNL RV PACING THRESHOLD PULSEWIDTH: 0.4 ms
MDC IDC MSMT LEADCHNL RV SENSING INTR AMPL: 10.375 mV
MDC IDC MSMT LEADCHNL RV SENSING INTR AMPL: 7.625 mV
MDC IDC SET LEADCHNL RV PACING AMPLITUDE: 2.5 V
MDC IDC SET ZONE DETECTION INTERVAL: 370 ms
MDC IDC STAT BRADY AS VS PERCENT: 5.46 %
Zone Setting Detection Interval: 320 ms
Zone Setting Detection Interval: 350 ms
Zone Setting Detection Interval: 450 ms

## 2013-11-22 NOTE — Progress Notes (Signed)
Wound check appointment.  Wound without redness or edema. Incision edges approximated, wound well healed. Normal device function. Thresholds, sensing, and impedances consistent with implant measurements. Device programmed at 3.5V for extra safety margin until 3 month visit. Histogram distribution appropriate for patient and level of activity. No ventricular arrhythmias noted. Patient educated about wound care, arm mobility, lifting restrictions, shock plan. ROV in 3 months with implanting physician. 

## 2013-11-23 ENCOUNTER — Encounter (INDEPENDENT_AMBULATORY_CARE_PROVIDER_SITE_OTHER): Payer: Self-pay | Admitting: General Surgery

## 2013-11-23 ENCOUNTER — Ambulatory Visit (INDEPENDENT_AMBULATORY_CARE_PROVIDER_SITE_OTHER): Payer: Medicare Other | Admitting: General Surgery

## 2013-11-23 VITALS — BP 118/75 | HR 77 | Temp 97.6°F | Resp 14 | Ht 76.0 in | Wt 182.0 lb

## 2013-11-23 DIAGNOSIS — K8020 Calculus of gallbladder without cholecystitis without obstruction: Secondary | ICD-10-CM

## 2013-11-23 LAB — CBC
HCT: 36.8 % — ABNORMAL LOW (ref 39.0–52.0)
HEMOGLOBIN: 12.2 g/dL — AB (ref 13.0–17.0)
MCH: 28.8 pg (ref 26.0–34.0)
MCHC: 33.2 g/dL (ref 30.0–36.0)
MCV: 87 fL (ref 78.0–100.0)
Platelets: 179 10*3/uL (ref 150–400)
RBC: 4.23 MIL/uL (ref 4.22–5.81)
RDW: 16.8 % — ABNORMAL HIGH (ref 11.5–15.5)
WBC: 8.1 10*3/uL (ref 4.0–10.5)

## 2013-11-23 NOTE — Progress Notes (Signed)
Patient ID: [name], male   DOB: 1946-01-15, 68 y.o.   MRN: [id number]  Chief Complaint  Patient presents with  . gallbladder drains    HPI Spencer Shelton. is a 68 y.o. male.   HPI 39 yom who was recently admitted to [place] for cholecystitis that had been present for a long time.  He ended up having his gb and an abscess drained. The abscess drain has been removed and the gb drain is not really draining much anymore. He feels much better.  He has no fevers, he is eating pretty well at this point.  He has no real complaints today and comes in for follow up.  Past Medical History  Diagnosis Date  . Anxiety   . Complete heart block   . Ventricular tachycardia   . Cardiomyopathy   . Atrial fibrillation   . Kidney stones     "once; passed on it's own" (10-15-2013)  . Hypertension   . High cholesterol   . CHF (congestive heart failure)   . Coronary artery disease     with known occlusions of all of his native coronary  . Anterior myocardial infarction 1982; 1995  . Asthma     "when I was a boy"  . Pneumonia     "couple times" (10-15-13)  . Type II diabetes mellitus   . Cholecystitis   . Automatic implantable cardioverter-defibrillator in situ     BI VENTRICULAR    Past Surgical History  Procedure Laterality Date  . Bi-ventricular implantable cardioverter defibrillator  (crt-d)  02/2008; 15-Oct-2013    MDT CRTD implanted by Dr Ladona Ridgel for primary prevention and CHF 2009 with 6949 lead; 09/2013 - gen change and RV lead revision by Dr Ladona Ridgel  . Cataract extraction w/ intraocular lens  implant, bilateral Bilateral ?2007  . Cardiac catheterization      "several"  . Coronary artery bypass graft  1982/1996    "CABG X5; CABG X4"     History reviewed. No pertinent family history.  Social History History  Substance Use Topics  . Smoking status: Former Smoker -- 4.00 packs/day for 12 years    Types: Cigarettes  . Smokeless tobacco: Never Used     Comment: "quit  smoking cigarettes in 1975  . Alcohol Use: No    Allergies  Allergen Reactions  . Statins Other (See Comments)    "hurt all over"  . Fenofibrate Rash  . Penicillins Rash  . Sulfonamide Derivatives Rash    Current Outpatient Prescriptions  Medication Sig Dispense Refill  . ALPHAGAN P 0.1 % SOLN Place 1 drop into both eyes 2 (two) times daily.       [name] amoxicillin-clavulanate (AUGMENTIN) 875-125 MG per tablet Take 1 tablet by mouth 2 (two) times daily.  28 tablet  0  . benazepril (LOTENSIN) 40 MG tablet Take 20 mg by mouth 2 (two) times daily.       . Coenzyme Q10 (COQ10) 100 MG CAPS Take 1 capsule by mouth daily.      . Cyanocobalamin (B-12 DOTS) 500 MCG TBDP Take 500 mg by mouth daily.       [name] ezetimibe (ZETIA) 10 MG tablet Take 10 mg by mouth daily.        . furosemide (LASIX) 40 MG tablet Take 40 mg by mouth 2 (two) times daily.       . metoprolol (LOPRESSOR) 50 MG tablet Take 50 mg by mouth 2 (two) times daily.       [name]  Multiple Vitamins-Minerals (MULTIVITAMIN WITH MINERALS) tablet Take 1 tablet by mouth daily.        . pioglitazone (ACTOS) 15 MG tablet Take 15 mg by mouth daily.      . TRAVATAN Z 0.004 % SOLN ophthalmic solution Place 1 drop into both eyes at bedtime.       . ursodiol (ACTIGALL) 300 MG capsule Take 300 mg by mouth 2 (two) times daily.        . valsartan (DIOVAN) 160 MG tablet Take 80 mg by mouth 2 (two) times daily.       [name] warfarin (COUMADIN) 5 MG tablet Take 5 mg by mouth daily.       No current facility-administered medications for this visit.    Review of Systems Review of Systems  Constitutional: Positive for unexpected weight change. Negative for fever and chills.  HENT: Negative for congestion, hearing loss, sore throat, trouble swallowing and voice change.   Eyes: Negative for visual disturbance.  Respiratory: Negative for cough and wheezing.   Cardiovascular: Negative for chest pain, palpitations and leg swelling.  Gastrointestinal: Negative for  nausea, vomiting, abdominal pain, diarrhea, constipation, blood in stool, abdominal distention, anal bleeding and rectal pain.  Genitourinary: Negative for hematuria and difficulty urinating.  Musculoskeletal: Negative for arthralgias.  Skin: Negative for rash and wound.  Neurological: Negative for seizures, syncope, weakness and headaches.  Hematological: Negative for adenopathy. Does not bruise/bleed easily.  Psychiatric/Behavioral: Negative for confusion.    Blood pressure 118/75, pulse 77, temperature 97.6 F (36.4 C), resp. rate 14, height 6\' 4"  (1.93 m), weight 182 lb (82.555 kg).  Physical Exam Physical Exam  Vitals reviewed. Constitutional: He appears well-developed and well-nourished.  Eyes: No scleral icterus.  Cardiovascular: Normal rate, regular rhythm and normal heart sounds.   Pulmonary/Chest: Effort normal and breath sounds normal. He has no wheezes. He has no rales.    Abdominal: Soft. Bowel sounds are normal. He exhibits no distension. There is no tenderness. No hernia.    Data Reviewed EXAM:  CT ABDOMEN AND PELVIS WITH CONTRAST  TECHNIQUE:  Multidetector CT imaging of the abdomen and pelvis was performed  using the standard protocol following bolus administration of  intravenous contrast.  CONTRAST: OMNIPAQUE IOHEXOL 300 MG/ML SOLN  COMPARISON: 10/25/2013  FINDINGS:  Small right pleural effusion is identified. Atelectasis is noted in  the posterior right lung base. Rounded atelectasis is noted in the  left posterior lung base. There is calcifications within the left  pleura.  The liver has a slightly nodular contour or suggestive of cirrhosis.  There is a percutaneous drainage catheter along the undersurface of  the left hepatic lobe. Interval resolution of subhepatic abscess.  Again noted is a percutaneous, trans a pathic cholecystostomy tube.  The tube is in satisfactory position within the gallbladder.  Multiple gallstones are again identified.  The volume of the  gallbladder appears slightly decreased from previous exam.  No biliary dilatation. The pancreas appears normal. The spleen is on  unremarkable.  The adrenal glands both appear normal. The right kidney is normal.  There is a cyst within the inferior pole the left kidney which  measures 2.6 cm, image 42/series 201. The urinary bladder appears  normal. Prostate gland and seminal vesicles are on unremarkable. See  a small amount of ascites is identified within the abdomen and  pelvis.  Calcified atherosclerotic disease involves the abdominal aorta. No  aneurysm. There is no upper abdominal adenopathy. No pelvic or  inguinal adenopathy.  The stomach is normal. The small bowel loops are on unremarkable.  Normal appearance of the colon.  Review of the visualized osseous structures is significant for  lumbar degenerative disc disease. This is most advanced at L5-S1. A  first degree anterolisthesis of L5 on S1 is noted. There are  bilateral L5 pars defects.  IMPRESSION:  1. There has been interval resolution of the left hepatic lobe  subhepatic abscess.  2. Satisfactory position of percutaneous cholecystostomy tube.  Gallstones are again noted within the lumen of the gallbladder.  3. Morphologic features of the liver compatible with cirrhosis.  4. Atherosclerotic disease.  5. Lumbar spondylosis.   Assessment    Cholecystitis     Plan    Laparoscopic, possible open, cholecystectomy with cholangiogram  I discussed the procedure in detail.  I told him somewhere around six weeks after drain would be time for surgery. I told him there is increased risk in range of 20% of open procedure. We discussed the risks and benefits of a laparoscopic cholecystectomy and possible cholangiogram including, but not limited to bleeding, infection, injury to surrounding structures such as the intestine or liver, bile leak, retained gallstones, need to convert to an open procedure, prolonged  diarrhea, blood clots such as  DVT, common bile duct injury, anesthesia risks, and possible need for additional procedures.  The likelihood of improvement in symptoms and return to the patient's normal status is good. We discussed the typical post-operative recovery course. I will see him before his surgery again and will obtain recs from Dr Donnie Ahoilley also.         Spencer Shelton 11/23/2013, 3:09 PM

## 2013-11-24 ENCOUNTER — Telehealth (INDEPENDENT_AMBULATORY_CARE_PROVIDER_SITE_OTHER): Payer: Self-pay

## 2013-11-24 DIAGNOSIS — K810 Acute cholecystitis: Secondary | ICD-10-CM

## 2013-11-24 LAB — COMPREHENSIVE METABOLIC PANEL
ALBUMIN: 4 g/dL (ref 3.5–5.2)
ALT: 9 U/L (ref 0–53)
AST: 20 U/L (ref 0–37)
Alkaline Phosphatase: 68 U/L (ref 39–117)
BILIRUBIN TOTAL: 0.5 mg/dL (ref 0.2–1.2)
BUN: 12 mg/dL (ref 6–23)
CHLORIDE: 105 meq/L (ref 96–112)
CO2: 29 meq/L (ref 19–32)
Calcium: 9.5 mg/dL (ref 8.4–10.5)
Creat: 1.12 mg/dL (ref 0.50–1.35)
Glucose, Bld: 119 mg/dL — ABNORMAL HIGH (ref 70–99)
POTASSIUM: 4.4 meq/L (ref 3.5–5.3)
SODIUM: 142 meq/L (ref 135–145)
TOTAL PROTEIN: 6.5 g/dL (ref 6.0–8.3)

## 2013-11-24 NOTE — Telephone Encounter (Signed)
Called pt to notify him of his CBC showing a little anemia but ok for surgery. I advised pt that I had to get clearance from Dr Donnie Aho first with instructions on holding the Coumadin before he could get a surgery date. The pt told me he has an appt with Dr Donnie Aho on 11/29/13 and he would request clearance at the appt. I will fax the clearance request along with Dr Doreen Salvage last note. The pt also mentioned that when he flushed the drain this am that it came out of the tubing somewhere b/c it soaked his bandage. The pt stated that it was really hard to flush and then he noticed he was saturated to where he had to change the bandage completley. I asked for him to call me in the am after he flushes it tonight and in the am to see what happens again. The pt will call me in the am.

## 2013-11-25 ENCOUNTER — Encounter (INDEPENDENT_AMBULATORY_CARE_PROVIDER_SITE_OTHER): Payer: Self-pay

## 2013-11-25 NOTE — Telephone Encounter (Signed)
Drain Study order has been placed early this am with Victorino Dike our referral coordinator. I explained the situation to Muddy about trying to get the drain study set up for Monday since the pt lives out of town. Victorino Dike will call pt once she gets the appt set up.

## 2013-11-25 NOTE — Telephone Encounter (Signed)
Called pt to advise him that Dr Dwain Sarna would want to get a drain study on the drain. I know the pt is coming back to [facility] on Monday so we will try arrange for the drain study to be done on Monday. The pt will arrive around 11:00am but he does have an appt with Dr Donnie Aho at 1:30 for cardiac clearance. I will place the order and get back in touch with him. The pt understands.

## 2013-11-25 NOTE — Telephone Encounter (Signed)
Ideally we would have him back here and would study the drain as it may be clogged.  He can stop flushing it and should come back at some point to study drain.  If he is otherwise ok it is not an emergency.

## 2013-11-25 NOTE — Telephone Encounter (Signed)
Pt called this am to report the drain is still doing the same thing with flushing being hard to do and it's leaking somewhere the pt states they are just not sure were. The pt is at [facility] now so please advise.

## 2013-11-26 ENCOUNTER — Other Ambulatory Visit (INDEPENDENT_AMBULATORY_CARE_PROVIDER_SITE_OTHER): Payer: Self-pay | Admitting: *Deleted

## 2013-11-26 DIAGNOSIS — K810 Acute cholecystitis: Secondary | ICD-10-CM

## 2013-11-29 ENCOUNTER — Ambulatory Visit (HOSPITAL_COMMUNITY)
Admission: RE | Admit: 2013-11-29 | Discharge: 2013-11-29 | Disposition: A | Discharge status: Home / Self Care | Payer: Medicare Other | Source: Ambulatory Visit | Attending: General Surgery | Admitting: General Surgery

## 2013-11-29 ENCOUNTER — Encounter: Payer: Self-pay | Admitting: Cardiology

## 2013-11-29 ENCOUNTER — Encounter (INDEPENDENT_AMBULATORY_CARE_PROVIDER_SITE_OTHER): Payer: Self-pay | Admitting: Surgery

## 2013-11-29 ENCOUNTER — Ambulatory Visit (INDEPENDENT_AMBULATORY_CARE_PROVIDER_SITE_OTHER): Payer: Medicare Other | Admitting: Surgery

## 2013-11-29 ENCOUNTER — Other Ambulatory Visit (INDEPENDENT_AMBULATORY_CARE_PROVIDER_SITE_OTHER): Payer: Self-pay | Admitting: General Surgery

## 2013-11-29 VITALS — BP 124/80 | HR 84 | Temp 97.9°F | Ht 76.0 in | Wt 185.0 lb

## 2013-11-29 DIAGNOSIS — K8000 Calculus of gallbladder with acute cholecystitis without obstruction: Secondary | ICD-10-CM

## 2013-11-29 DIAGNOSIS — K750 Abscess of liver: Secondary | ICD-10-CM

## 2013-11-29 DIAGNOSIS — K7460 Unspecified cirrhosis of liver: Secondary | ICD-10-CM

## 2013-11-29 DIAGNOSIS — Z438 Encounter for attention to other artificial openings: Secondary | ICD-10-CM | POA: Insufficient documentation

## 2013-11-29 DIAGNOSIS — K810 Acute cholecystitis: Secondary | ICD-10-CM

## 2013-11-29 MED ORDER — IOHEXOL 300 MG/ML  SOLN
50.0000 mL | Freq: Once | INTRAMUSCULAR | Status: AC | PRN
  Administered 2013-11-29: 10 mL

## 2013-11-29 NOTE — Procedures (Signed)
The existing cholecystomy tube appears appropriately positioned within a markedly decompressed GB.  No exchange performed. Cholelithiasis without opacification of the cystic duct. PLAN: Keep chole tube connected to gravity bag until lap chole scheduled for early next month.

## 2013-11-29 NOTE — Progress Notes (Unsigned)
Patient ID: [name], male   DOB: Sep 16, 1945, 68 y.o.   MRN: [id number]   Spencer Shelton  Date of visit:  11/29/2013 DOB:  16-Mar-1946    Age:  68 yrs. Medical record number:  [id number]     Account number:  [id number] Primary Care Provider: AGUIAR,RAFAELA ____________________________ CURRENT DIAGNOSES  1. Pre-Op Cardiovascular Exam  2. Cad,native  3. Arrhythmia-Atrial Fibrillation  4. Hyperlipidemia  5. Hypertension-Essential (Benign)  6. Long Term Use Anticoagulant  7. AICD in situ  8. Surgery-Aortocoronary Bypass Grafting  9. MI-S/P Anterior  10. Congestive Heart Failure Chronic Systolic  11. Cirrhosis Of Liver Nos  12. Bruit  13. Diabetes Mellitus-nidd/circ Dis/controlled ____________________________ ALLERGIES  Fenofibrate,  myalgias  Niacin, Flushing  Penicillins, Intolerance-unknown  Sulfa (Sulfonamides), Intolerance-unknown ____________________________ MEDICATIONS  1. Vitamin B Complex tablet, 1 p.o. q.d.  2. Alphagan 0.2 % Eye Drops, 1gtt ou bid  3. Travatan 0.004 % Eye Drops, 1 gtt ou qhs  4. CoQ-10 30 mg capsule, 1 p.o. daily  5. metoprolol tartrate 50 mg tablet, 1/2 bid  6. ursodiol 300 mg capsule, BID  7. Nitroglycerin 0.4 Mg Tablet, Sublingual, PRN  8. furosemide 80 mg tablet, 1/2 tab b.i.d.  9. Coumadin 5 mg tablet, 1 qd or as directed  10. benazepril 40 mg tablet, 1/2 tab b.i.d.  11. Diovan 160 mg tablet, 1/2 tab b.i.d.  12. Zetia 10 mg tablet, 1 p.o. q.d.  13. hydrocodone 5 mg-acetaminophen 325 mg tablet, PRN ____________________________ CHIEF COMPLAINTS  Followup of Congestive Heart Failure Chronic Systolic  Preop cardiac evaluation ____________________________ HISTORY OF PRESENT ILLNESS Patient seen for preoperative evaluation. The patient has a history of chronic systolic heart failure and previous redo bypass grafting. He recently had a gallbladder drain placed her acute cholecystitis and is feeling better now. He went this morning to have his drain  looked at because it was no longer draining and it was flushed but he is now having some abdominal pain with some pleuritic component radiating through to his back following the procedure. The pain was about a 7/10 earlier but is now about a 5/10. His INR is stable at the present time. For some reason he was placed on pioglitazone and he should really not take this because of heart failure. He denies angina and has no PND, orthopnea or edema. He does have an implantable defibrillator. ____________________________ PAST HISTORY  Past Medical Illnesses:  hyperlipidemia, DM-diet controlled, pneumonia, hypertension, cirrhosis - either cardiac or steatohepatic, cholelithiasis;  Cardiovascular Illnesses:  atrial fibrillation, CAD, CHF-compensated, S/P MI-anterior;  Infectious Diseases:  childhood illnesses of mumps, measles and chickenpox;  Surgical Procedures:  CABG w/SVGs to LAD Diag 1 Diag 2 OM 1 PDA 1982, CABG w/LIMA and SVGs to Diag 1 PDA January 1996, [employer] AICD implant May 2005, cataract extraction OU;  Trauma History:  no history of trauma;  Cardiology Procedures-Invasive:  cardioversion April 2004, cardioversion July 2004, cardioversion March 2005, cardiac cath (left) July 2008;  Cardiology Procedures-Noninvasive:  echocardiogram April 2004, echocardiogram December 2011, abdominal aortic ultrasound February 2012, echocardiogram November 2013;  Cardiac Cath Results:  occluded Left main, occluded RCA, widely patent LAD LIMA graft, widely patent Diag 1 SVG, widely patent OM 1 SVG;  Peripheral Vascular Procedures:  carotid doppler November 2013;  LVEF of 35% documented via echocardiogram on 04/29/2012,  CHADS Score:  3,  CHA2DS2-VASC Score:  5 ____________________________ CARDIO-PULMONARY TEST DATES EKG Date:  06/24/2011;   Cardiac Cath Date:  01/12/2007;  CABG: 06/25/1994;  Nuclear Study  Date:  11/18/2006;  Echocardiography Date: 04/29/2012;  Chest Xray Date: 12/29/2006;  CT Scan Date:  07/23/2010    ____________________________ FAMILY HISTORY Brother -- Brother alive and well Brother -- Brother alive and well Father -- Father dead, Diabetes mellitus, Deceased Mother -- Mother dead, CVA Sister -- Sister alive and well ____________________________ SOCIAL HISTORY Alcohol Use:  does not use alcohol;  Smoking:  used to smoke but quit Prior to 1980;  Diet:  fat modified diet and diabetic diet;  Lifestyle:  divorced and remarried 1997;  Exercise:  some exercise;  Occupation:  retired and owned [employer];  Residence:  lives with wife and lives at [facility];   ____________________________ REVIEW OF SYSTEMS General:  weight gain of approximately 5 lbs Eyes: wears eye glasses/contact lenses, cataract extraction bilaterally Ears, Nose, Throat, Mouth:  chronic sinusitis Respiratory: denies dyspnea, cough, wheezing or hemoptysis. Cardiovascular:  please review HPI Abdominal: see HPI Musculoskeletal:  arthritis of the shoulder, ankle pain, arthritis of the hips Neurological:  denies headaches, stroke, or TIA  ____________________________ PHYSICAL EXAMINATION VITAL SIGNS  Blood Pressure:  132/70 Sitting, Left arm, regular cuff  , 126/70 Standing, Left arm and regular cuff   Pulse:  88/min. Weight:  184.50 lbs. Height:  76"BMI: 22  Constitutional:  pleasant white male in no acute distress Skin:  warm and dry to touch, no apparent skin lesions, or masses noted. Head:  normocephalic, normal hair pattern, no masses or tenderness Neck:   left carotid bruit present, no JVD, no masses, non-tender Chest:  normal symmetry, clear to auscultation., healed median sternotomy scar Cardiac:  regular rhythm, normal S1 and S2, No S3 or S4, no murmurs, gallops or rubs detected. Abdomen:  Drainage tubes in rUQ Peripheral Pulses:  the femoral,dorsalis pedis, and posterior tibial pulses are full and equal bilaterally with no bruits auscultated. Extremities & Back:  well healed saphenous vein donor site RLE, well  healed saphenous vein donor site LLE, bilateral venous insufficiency changes present, no edema present Neurological:  no gross motor or sensory deficits noted, affect appropriate, oriented x3. ____________________________ MOST RECENT LIPID PANEL 09/10/12  CHOL TOTL 175 mg/dl, LDL 131 calc, HDL 39 mg/dl and TRIGLYCER 438 mg/dl ____________________________ IMPRESSIONS/PLAN  1. From a cardiovascular viewpoint may proceed with the planned gallbladder operation. His anesthetic risk is increased due to cirrhosis as well as his underlying cardiac condition but his cardiac conditions have been stable and his heart failure is not decompensated.  At the time of surgery he may discontinue his warfarin 4 days prior to surgery without bridging. Indications for warfarin are chronic atrial fibrillation. In addition his defibrillator function should be deactivated during surgery and turned back on after surgery by the representative. He should continue the pacing function with surgery. Let me know when his surgery is and I will be happy to follow him with you in the hospital.  2. Abdominal pain following recent injection of gallbladder drainage tube I was concerned about the abdominal pain he is currently having a CT had not been having abdominal pain and asked him to be seen at [facility] surgery today for a check.  3. Coronary artery disease with previous ischemic cardiomyopathy and previous redo bypass grafting   ____________________________ TODAYS ORDERS  1. Return Visit: 1 month  2. Coag Clinic Visit: Coag OV 1 month                       ____________________________ Cardiology Physician:  Georga Hacking,  Montez HagemanJr. MD [facility]

## 2013-11-29 NOTE — Patient Instructions (Signed)
Please consider the recommendations that we have given you today:  Continue drain in the gallbladder until surgery.  Tentative plan is cholecystectomy to remove the gallbladder & the drain in mid July.  [facility] Scheduling should call you later this week once we get Dr. York Spaniel official cardiac clearance letter.  If you have not heard from our office, call us later this week.  Stop smoking.  See the Handout(s) we have given you.  Please call our office at [phone] if you wish to schedule surgery or if you have further questions / concerns.   Cholecystitis Cholecystitis is an inflammation of your gallbladder. It is usually caused by a buildup of gallstones or sludge (cholelithiasis) in your gallbladder. The gallbladder stores a fluid that helps digest fats (bile). Cholecystitis is serious and needs treatment right away.  CAUSES   Gallstones. Gallstones can block the tube that leads to your gallbladder, causing bile to build up. As bile builds up, the gallbladder becomes inflamed.  Bile duct problems, such as blockage from scarring or kinking.  Tumors. Tumors can stop bile from leaving your gallbladder correctly, causing bile to build up. As bile builds up, the gallbladder becomes inflamed. SYMPTOMS   Nausea.  Vomiting.  Abdominal pain, especially in the upper right area of your abdomen.  Abdominal tenderness or bloating.  Sweating.  Chills.  Fever.  Yellowing of the skin and the whites of the eyes (jaundice). DIAGNOSIS  Your caregiver may order blood tests to look for infection or gallbladder problems. Your caregiver may also order imaging tests, such as an ultrasound or computed tomography (CT) scan. Further tests may include a hepatobiliary iminodiacetic acid (HIDA) scan. This scan allows your caregiver to see your bile move from the liver to the gallbladder and to the small intestine. TREATMENT  A hospital stay is usually necessary to lessen the inflammation of your  gallbladder. You may be required to not eat or drink (fast) for a certain amount of time. You may be given medicine to treat pain or an antibiotic medicine to treat an infection. Surgery may be needed to remove your gallbladder (cholecystectomy) once the inflammation has gone down. Surgery may be needed right away if you develop complications such as death of gallbladder tissue (gangrene) or a tear (perforation) of the gallbladder.  HOME CARE INSTRUCTIONS  Home care will depend on your treatment. In general:  If you were given antibiotics, take them as directed. Finish them even if you start to feel better.  Only take over-the-counter or prescription medicines for pain, discomfort, or fever as directed by your caregiver.  Follow a low-fat diet until you see your caregiver again.  Keep all follow-up visits as directed by your caregiver. SEEK IMMEDIATE MEDICAL CARE IF:   Your pain is increasing and not controlled by medicines.  Your pain moves to another part of your abdomen or to your back.  You have a fever.  You have nausea and vomiting. MAKE SURE YOU:  Understand these instructions.  Will watch your condition.  Will get help right away if you are not doing well or get worse. Document Released: 06/03/2005 Document Revised: 08/26/2011 Document Reviewed: 04/19/2011 [facility] Patient Information 2014 [place], [place].  DRAIN CARE:   You have a closed bulb drain to help you heal.  A bulb drain is a small, plastic reservoir which creates a gentle suction. It is used to remove excess fluid from a surgical wound. The color and amount of fluid will vary. Immediately after surgery, the  fluid is bright red. It may gradually change to a yellow color. When the amount decreases to about 1 or 2 tablespoons (15 to 30 cc) per 24 hours, your caregiver will usually remove it.  DAILY CARE  Keep the bulb compressed at all times, except while emptying it. The compression creates suction.   Keep  sites where the tubes enter the skin dry and covered with a light bandage (dressing).   Tape the tubes to your skin, 1 to 2 inches below the insertion sites, to keep from pulling on your stitches. Tubes are stitched in place and will not slip out.   Pin the bulb to your shirt (not to your pants) with a safety pin.   For the first few days after surgery, there usually is more fluid in the bulb. Empty the bulb whenever it becomes half full because the bulb does not create enough suction if it is too full. Include this amount in your 24 hour totals.   When the amount of drainage decreases, empty the bulb at the same time every day. Write down the amounts and the 24 hour totals. Your caregiver will want to know them. This helps your caregiver know when the tubes can be removed.   (We anticipate removing the drain in 1-3 weeks, depending on when the output is <36mL a day for 2+ days)  If there is drainage around the tube sites, change dressings and keep the area dry. If you see a clot in the tube, leave it alone. However, if the tube does not appear to be draining, let your caregiver know.  TO EMPTY THE BULB  Open the stopper to release suction.   Holding the stopper out of the way, pour drainage into the measuring cup that was sent home with you.   Measure and write down the amount. If there are 2 bulbs, note the amount of drainage from bulb 1 or bulb 2 and keep the totals separate. Your caregiver will want to know which tube is draining more.   Compress the bulb by folding it in half.   Replace the stopper.   Check the tape that holds the tube to your skin, and pin the bulb to your shirt.  SEEK MEDICAL CARE IF:  The drainage develops a bad odor.   You have an oral temperature above 102 F (38.9 C).   The amount of drainage from your wound suddenly increases or decreases.   You accidentally pull out your drain.   You have any other questions or concerns.  MAKE SURE YOU:    Understand these instructions.   Will watch your condition.   Will get help right away if you are not doing well or get worse.     Call our office if you have any questions about your drain. [phone]  STOP SMOKING!  We strongly recommend that you stop smoking.  Smoking increases the risk of surgery including infection in the form of an open wound, pus formation, abscess, hernia at an incision on the abdomen, etc.  You have an increased risk of other MAJOR complications such as stroke, heart attack, forming clots in the leg and/or lungs, and death.    Smoking Cessation Quitting smoking is important to your health and has many advantages. However, it is not always easy to quit since nicotine is a very addictive drug. Often times, people try 3 times or more before being able to quit. This document explains the best ways for you to prepare  to quit smoking. Quitting takes hard work and a lot of effort, but you can do it. ADVANTAGES OF QUITTING SMOKING  You will live longer, feel better, and live better.  Your body will feel the impact of quitting smoking almost immediately.  Within 20 minutes, blood pressure decreases. Your pulse returns to its normal level.  After 8 hours, carbon monoxide levels in the blood return to normal. Your oxygen level increases.  After 24 hours, the chance of having a heart attack starts to decrease. Your breath, hair, and body stop smelling like smoke.  After 48 hours, damaged nerve endings begin to recover. Your sense of taste and smell improve.  After 72 hours, the body is virtually free of nicotine. Your bronchial tubes relax and breathing becomes easier.  After 2 to 12 weeks, lungs can hold more air. Exercise becomes easier and circulation improves.  The risk of having a heart attack, stroke, cancer, or lung disease is greatly reduced.  After 1 year, the risk of coronary heart disease is cut in half.  After 5 years, the risk of stroke falls to  the same as a nonsmoker.  After 10 years, the risk of lung cancer is cut in half and the risk of other cancers decreases significantly.  After 15 years, the risk of coronary heart disease drops, usually to the level of a nonsmoker.  If you are pregnant, quitting smoking will improve your chances of having a healthy baby.  The people you live with, especially any children, will be healthier.  You will have extra money to spend on things other than cigarettes. QUESTIONS TO THINK ABOUT BEFORE ATTEMPTING TO QUIT You may want to talk about your answers with your caregiver.  Why do you want to quit?  If you tried to quit in the past, what helped and what did not?  What will be the most difficult situations for you after you quit? How will you plan to handle them?  Who can help you through the tough times? Your family? Friends? A caregiver?  What pleasures do you get from smoking? What ways can you still get pleasure if you quit? Here are some questions to ask your caregiver:  How can you help me to be successful at quitting?  What medicine do you think would be best for me and how should I take it?  What should I do if I need more help?  What is smoking withdrawal like? How can I get information on withdrawal? GET READY  Set a quit date.  Change your environment by getting rid of all cigarettes, ashtrays, matches, and lighters in your home, car, or work. Do not let people smoke in your home.  Review your past attempts to quit. Think about what worked and what did not. GET SUPPORT AND ENCOURAGEMENT You have a better chance of being successful if you have help. You can get support in many ways.  Tell your family, friends, and co-workers that you are going to quit and need their support. Ask them not to smoke around you.  Get individual, group, or telephone counseling and support. Programs are available at [facility]. Call your local health department for  information about programs in your area.  Spiritual beliefs and practices may help some smokers quit.  Download a "quit meter" on your computer to keep track of quit statistics, such as how long you have gone without smoking, cigarettes not smoked, and money saved.  Get a self-help book  about quitting smoking and staying off of tobacco. LEARN NEW SKILLS AND BEHAVIORS  Distract yourself from urges to smoke. Talk to someone, go for a walk, or occupy your time with a task.  Change your normal routine. Take a different route to work. Drink tea instead of coffee. Eat breakfast in a different place.  Reduce your stress. Take a hot bath, exercise, or read a book.  Plan something enjoyable to do every day. Reward yourself for not smoking.  Explore interactive web-based programs that specialize in helping you quit. GET MEDICINE AND USE IT CORRECTLY Medicines can help you stop smoking and decrease the urge to smoke. Combining medicine with the above behavioral methods and support can greatly increase your chances of successfully quitting smoking.  Nicotine replacement therapy helps deliver nicotine to your body without the negative effects and risks of smoking. Nicotine replacement therapy includes nicotine gum, lozenges, inhalers, nasal sprays, and skin patches. Some may be available over-the-counter and others require a prescription.  Antidepressant medicine helps people abstain from smoking, but how this works is unknown. This medicine is available by prescription.  Nicotinic receptor partial agonist medicine simulates the effect of nicotine in your brain. This medicine is available by prescription. Ask your caregiver for advice about which medicines to use and how to use them based on your health history. Your caregiver will tell you what side effects to look out for if you choose to be on a medicine or therapy. Carefully read the information on the package. Do not use any other product  containing nicotine while using a nicotine replacement product.  RELAPSE OR DIFFICULT SITUATIONS Most relapses occur within the first 3 months after quitting. Do not be discouraged if you start smoking again. Remember, most people try several times before finally quitting. You may have symptoms of withdrawal because your body is used to nicotine. You may crave cigarettes, be irritable, feel very hungry, cough often, get headaches, or have difficulty concentrating. The withdrawal symptoms are only temporary. They are strongest when you first quit, but they will go away within 10 14 days. To reduce the chances of relapse, try to:  Avoid drinking alcohol. Drinking lowers your chances of successfully quitting.  Reduce the amount of caffeine you consume. Once you quit smoking, the amount of caffeine in your body increases and can give you symptoms, such as a rapid heartbeat, sweating, and anxiety.  Avoid smokers because they can make you want to smoke.  Do not let weight gain distract you. Many smokers will gain weight when they quit, usually less than 10 pounds. Eat a healthy diet and stay active. You can always lose the weight gained after you quit.  Find ways to improve your mood other than smoking. FOR MORE INFORMATION  www.smokefree.gov    While it can be one of the most difficult things to do, the [place] has programs to help you stop.  Consider talking with your primary care physician about options.  Also, Smoking Cessation classes are available through the [facility]:  The smoking cessation program is a proven-effective program from the American Lung Association. The program is available for anyone 9 and older who currently smokes. The program lasts for 7 weeks and is 8 sessions. Each class will be approximately 1 1/2 hours. The program is every Tuesday.  All classes are 12-1:30pm and same location.  Event Location Information:  Location: [facility] 2nd Floor  Conference Room 2-037; located next to [facility]  Closest cross streets: [address] [employer] Entrance into the [facility] is adjacent to the [employer] main entrance. The conference room is located on the 2nd floor.  Parking Instructions: Visitor parking is adjacent to [employer] main entrance and the Cancer Center    A smoking cessation program is also offered through the [place]. Register online at [email or web address] or call [phone] for more information.   Tobacco cessation counseling is available at [facility]. Call [phone] for a free appointment.   Tobacco cessation classes also are available through the [facility] in [place]. For information, call [phone].   The Patient Education Network features videos on tobacco cessation. Please consult your listings in the center of this book to find instructions on how to access this resource.   If you want more information, ask your nurse.   LAPAROSCOPIC SURGERY: POST OP INSTRUCTIONS  1. DIET: Follow a light bland diet the first 24 hours after arrival home, such as soup, liquids, crackers, etc.  Be sure to include lots of fluids daily.  Avoid fast food or heavy meals as your are more likely to get nauseated.  Eat a low fat the next few days after surgery.   2. Take your usually prescribed home medications unless otherwise directed. 3. PAIN CONTROL: a. Pain is best controlled by a usual combination of three different methods TOGETHER: i. Ice/Heat ii. Over the counter pain medication iii. Prescription pain medication b. Most patients will experience some swelling and bruising around the incisions.  Ice packs or heating pads (30-60 minutes up to 6 times a day) will help. Use ice for the first few days to help decrease swelling and bruising, then switch to heat to help relax tight/sore spots and speed recovery.   Some people prefer to use ice alone, heat alone, alternating between ice & heat.  Experiment to what works for you.  Swelling and bruising can take several weeks to resolve.   c. It is helpful to take an over-the-counter pain medication regularly for the first few weeks.  Choose one of the following that works best for you: i. Naproxen (Aleve, etc)  Two 220mg  tabs twice a day ii. Ibuprofen (Advil, etc) Three 200mg  tabs four times a day (every meal & bedtime) iii. Acetaminophen (Tylenol, etc) 500-650mg  four times a day (every meal & bedtime) d. A  prescription for pain medication (such as oxycodone, hydrocodone, etc) should be given to you upon discharge.  Take your pain medication as prescribed.  i. If you are having problems/concerns with the prescription medicine (does not control pain, nausea, vomiting, rash, itching, etc), please call us [phone] to see if we need to switch you to a different pain medicine that will work better for you and/or control your side effect better. ii. If you need a refill on your pain medication, please contact your pharmacy.  They will contact our office to request authorization. Prescriptions will not be filled after 5 pm or on week-ends. 4. Avoid getting constipated.  Between the surgery and the pain medications, it is common to experience some constipation.  Increasing fluid intake and taking a fiber supplement (such as Metamucil, Citrucel, FiberCon, MiraLax, etc) 1-2 times a day regularly will usually help prevent this problem from occurring.  A mild laxative (prune juice, Milk of Magnesia, MiraLax, etc) should be taken according to package directions if there are no bowel movements after 48 hours.  5. Watch out for diarrhea.  If you have many loose bowel movements, simplify your diet to bland foods & liquids for a few days.  Stop any stool softeners and decrease your fiber supplement.  Switching to mild anti-diarrheal medications (Kayopectate, Pepto Bismol) can  help.  If this worsens or does not improve, please call us. 6. Wash / shower every day.  You may shower over the dressings as they are waterproof.  Continue to shower over incision(s) after the dressing is off. 7. Remove your waterproof bandages 5 days after surgery.  You may leave the incision open to air.  You may replace a dressing/Band-Aid to cover the incision for comfort if you wish.  8. ACTIVITIES as tolerated:   a. You may resume regular (light) daily activities beginning the next day-such as daily self-care, walking, climbing stairs-gradually increasing activities as tolerated.  If you can walk 30 minutes without difficulty, it is safe to try more intense activity such as jogging, treadmill, bicycling, low-impact aerobics, swimming, etc. b. Save the most intensive and strenuous activity for last such as sit-ups, heavy lifting, contact sports, etc  Refrain from any heavy lifting or straining until you are off narcotics for pain control.   c. DO NOT PUSH THROUGH PAIN.  Let pain be your guide: If it hurts to do something, don't do it.  Pain is your body warning you to avoid that activity for another week until the pain goes down. d. You may drive when you are no longer taking prescription pain medication, you can comfortably wear a seatbelt, and you can safely maneuver your car and apply brakes. e. Bonita Quin may have sexual intercourse when it is comfortable.  9. FOLLOW UP in our office a. Please call [facility] at [phone] to set up an appointment to see your surgeon in the office for a follow-up appointment approximately 2-3 weeks after your surgery. b. Make sure that you call for this appointment the day you arrive home to insure a convenient appointment time. 10. IF YOU HAVE DISABILITY OR FAMILY LEAVE FORMS, BRING THEM TO THE OFFICE FOR PROCESSING.  DO NOT GIVE THEM TO YOUR DOCTOR.   WHEN TO CALL us [phone]: 1. Poor pain control 2. Reactions / problems with new medications (rash/itching,  nausea, etc)  3. Fever over 101.5 F (38.5 C) 4. Inability to urinate 5. Nausea and/or vomiting 6. Worsening swelling or bruising 7. Continued bleeding from incision. 8. Increased pain, redness, or drainage from the incision   The clinic staff is available to answer your questions during regular business hours (8:30am-5pm).  Please don't hesitate to call and ask to speak to one of our nurses for clinical concerns.   If you have a medical emergency, go to the nearest emergency room or call 911.  A surgeon from [facility] is always on call at the [facility] [place] [address], [place], [place]  [address] ? MAIN: [phone] ? TOLL FREE: [phone] ?  FAX [phone] [email or web address]

## 2013-11-29 NOTE — Progress Notes (Signed)
Subjective:     Patient ID: [name], male   DOB: 1945-07-18, 68 y.o.   MRN: [id number]  HPI  Note: This dictation was prepared with Dragon/digital dictation along with [facility] technology. Any transcriptional errors that result from this process are unintentional.       Spencer BADAL Sr.  07-27-1945 [id number]  Patient Care Team: Pcp Not In System as PCP - General  Procedure (Date: 10/27/2013):  PROCEDURE:  1. Ultrasound-guided transhepatic puncture of the gallbladder  2. Placement of a 10 French percutaneous cholecystostomy tube under  fluoroscopic guidance  3. Ultrasound-guided aspiration of subcapsular perihepatic fluid  4. Placement of a 10 French percutaneous drain under fluoroscopic  guidance into the subcapsular perihepatic fluid collection  Interventional Radiologist: Spencer Big, MD    This patient returns for surgical re-evaluation.  Fully anticoagulated, DM, smoking patient than normally lives down in [facility].  Had worsening pain.  Came up to [facility] after a week.  Admitted for severe acute cholecystitis with liver abscess.  Anticoagulation held in percutaneous cholecystostomy tube & liver abscess drains placed.  It is better over this month.  He has been feeling better.  Liver abscess drained out.  Completed antibiotics.  However there were some concerns about difficult it was flushing the tube and leaking around the tube.  Patient due to follow up with his cardiologist for cardiac clearance in [place] today.  Repeat GB drain study done.  Patient noticed abdominal discomfort and discussed with his cardiologist.  Dr. Donnie Aho was concerned.  He asked that we see the patient today to doublecheck things.  Since the cardiology visit a few hours ago, he is feeling much better.  Denies fevers or chills.  Tolerated lunch.  No nausea or vomiting.  In good spirits.  Feels like his back to his baseline.  He notes the drain empties about 2  tablespoons a day.  Patient Active Problem List   Diagnosis Date Noted  . Sepsis 10/30/2013  . Acute cholecystitis 10/26/2013  . Cholecystitis 10/26/2013  . PNA (pneumonia) 10/26/2013  . CAD (coronary artery disease), native coronary artery 10/26/2013  . Hyperlipidemia   . Cirrhosis   . Ventricular tachycardia (paroxysmal) 09/16/2013  . Biventricular implantable cardioverter-defibrillator in situ 04/16/2011  . Essential hypertension, benign 03/28/2010  . Atrial fibrillation 03/28/2010  . Chronic systolic heart failure 03/28/2010    Past Medical History  Diagnosis Date  . Anxiety   . Complete heart block   . Ventricular tachycardia   . Cardiomyopathy   . Atrial fibrillation   . Kidney stones     "once; passed on it's own" (10-09-2013)  . Hypertension   . High cholesterol   . CHF (congestive heart failure)   . Coronary artery disease     with known occlusions of all of his native coronary  . Anterior myocardial infarction 1982; 1995  . Asthma     "when I was a boy"  . Pneumonia     "couple times" (10-09-13)  . Type II diabetes mellitus   . Cholecystitis   . Automatic implantable cardioverter-defibrillator in situ     BI VENTRICULAR    Past Surgical History  Procedure Laterality Date  . Bi-ventricular implantable cardioverter defibrillator  (crt-d)  02/2008; 10-09-13    MDT CRTD implanted by Dr Ladona Ridgel for primary prevention and CHF 2009 with 6949 lead; 09/2013 - gen change and RV lead revision by Dr Ladona Ridgel  . Cataract extraction w/ intraocular lens  implant,  bilateral Bilateral ?2007  . Cardiac catheterization      "several"  . Coronary artery bypass graft  1982/1996    "CABG X5; CABG X4"     History   Social History  . Marital Status: Married    Spouse Name: N/A    Number of Children: N/A  . Years of Education: N/A   Occupational History  . Retired    Social History Main Topics  . Smoking status: Former Smoker -- 4.00 packs/day for 12 years    Types:  Cigarettes  . Smokeless tobacco: Never Used     Comment: "quit smoking cigarettes in 1975  . Alcohol Use: No  . Drug Use: No  . Sexual Activity: No   Other Topics Concern  . Not on file   Social History Narrative  . No narrative on file    History reviewed. No pertinent family history.  Current Outpatient Prescriptions  Medication Sig Dispense Refill  . ALPHAGAN P 0.1 % SOLN Place 1 drop into both eyes 2 (two) times daily.       [name] amoxicillin-clavulanate (AUGMENTIN) 875-125 MG per tablet Take 1 tablet by mouth 2 (two) times daily.  28 tablet  0  . benazepril (LOTENSIN) 40 MG tablet Take 20 mg by mouth 2 (two) times daily.       . Coenzyme Q10 (COQ10) 100 MG CAPS Take 1 capsule by mouth daily.      . Cyanocobalamin (B-12 DOTS) 500 MCG TBDP Take 500 mg by mouth daily.       [name] ezetimibe (ZETIA) 10 MG tablet Take 10 mg by mouth daily.        . furosemide (LASIX) 40 MG tablet Take 40 mg by mouth 2 (two) times daily.       . metoprolol (LOPRESSOR) 50 MG tablet Take 50 mg by mouth 2 (two) times daily.       . Multiple Vitamins-Minerals (MULTIVITAMIN WITH MINERALS) tablet Take 1 tablet by mouth daily.        . TRAVATAN Z 0.004 % SOLN ophthalmic solution Place 1 drop into both eyes at bedtime.       . ursodiol (ACTIGALL) 300 MG capsule Take 300 mg by mouth 2 (two) times daily.        . valsartan (DIOVAN) 160 MG tablet Take 80 mg by mouth 2 (two) times daily.       [name] warfarin (COUMADIN) 5 MG tablet Take 5 mg by mouth daily.       No current facility-administered medications for this visit.     Allergies  Allergen Reactions  . Statins Other (See Comments)    "hurt all over"  . Fenofibrate Rash  . Penicillins Rash  . Sulfonamide Derivatives Rash    BP 124/80  Pulse 84  Temp(Src) 97.9 F (36.6 C)  Ht 6\' 4"  (1.93 m)  Wt 185 lb (83.915 kg)  BMI 22.53 kg/m2  Ct Abdomen Pelvis W Contrast  10/31/2013   CLINICAL DATA:  Followup abscesses.  EXAM: CT ABDOMEN AND PELVIS WITH CONTRAST   TECHNIQUE: Multidetector CT imaging of the abdomen and pelvis was performed using the standard protocol following bolus administration of intravenous contrast.  CONTRAST:  OMNIPAQUE IOHEXOL 300 MG/ML  SOLN  COMPARISON:  10/25/2013  FINDINGS: Small right pleural effusion is identified. Atelectasis is noted in the posterior right lung base. Rounded atelectasis is noted in the left posterior lung base. There is calcifications within the left pleura.  The liver has a slightly  nodular contour or suggestive of cirrhosis. There is a percutaneous drainage catheter along the undersurface of the left hepatic lobe. Interval resolution of subhepatic abscess. Again noted is a percutaneous, trans a pathic cholecystostomy tube. The tube is in satisfactory position within the gallbladder. Multiple gallstones are again identified. The volume of the gallbladder appears slightly decreased from previous exam.  No biliary dilatation. The pancreas appears normal. The spleen is on unremarkable.  The adrenal glands both appear normal. The right kidney is normal. There is a cyst within the inferior pole the left kidney which measures 2.6 cm, image 42/series 201. The urinary bladder appears normal. Prostate gland and seminal vesicles are on unremarkable. See a small amount of ascites is identified within the abdomen and pelvis.  Calcified atherosclerotic disease involves the abdominal aorta. No aneurysm. There is no upper abdominal adenopathy. No pelvic or inguinal adenopathy.  The stomach is normal. The small bowel loops are on unremarkable. Normal appearance of the colon.  Review of the visualized osseous structures is significant for lumbar degenerative disc disease. This is most advanced at L5-S1. A first degree anterolisthesis of L5 on S1 is noted. There are bilateral L5 pars defects.  IMPRESSION: 1. There has been interval resolution of the left hepatic lobe subhepatic abscess. 2. Satisfactory position of percutaneous  cholecystostomy tube. Gallstones are again noted within the lumen of the gallbladder. 3. Morphologic features of the liver compatible with cirrhosis. 4. Atherosclerotic disease. 5. Lumbar spondylosis.   Electronically Signed   By: Signa Kell M.D.   On: 10/31/2013 16:28   Ir Cholan Exist Tube  11/29/2013   INDICATION: History of acute cholecystitis, post ultrasound and fluoroscopic guided percutaneous cholecystostomy tube placement as well as placement of a percutaneous drainage catheter into a biloma within the caudal aspect of the medial segment of the left lobe of the liver (both procedures performed on 10/27/2013). The patient a underwent repeat abdominal CT performed 10/31/2013 which demonstrates resolution of the biloma and appropriate positioning of the percutaneous cholecystostomy tube. The biloma drainage catheter as removed on 11/09/2013. The patient now presents with decreased output from the existing cholecystostomy tube though has remained asymptomatic without recurrence of his right upper quadrant abdominal pain or fever. Of note, patient is to undergo attempted laparoscopic cholecystectomy early next month.  EXAM: CHOLANGIOGRAM VIA EXISTING CATHETER  MEDICATIONS: None  ANESTHESIA/SEDATION: None  CONTRAST:  10mL OMNIPAQUE IOHEXOL 300 MG/ML SOLN - administered via the existing cholecystostomy tube  COMPLICATIONS: Ultrasound fluoroscopic guided cholecystostomy tube and hepatic abscess drainage catheter placement - 10/27/2013; CT the abdomen pelvis - 10/31/2013; 10/25/2013  PROCEDURE: A preprocedural spot fluoroscopic image was obtained of the existing cholecystostomy tube.  Multiple spot fluoroscopic and radiographic images were obtained of the existing cholecystostomy tube in various obliquities following the injection of a small amount of contrast. Images were reviewed and the procedure was terminated. The catheter is reconnected to a gravity bag.  FINDINGS: Preprocedural spot fluoroscopic image  demonstrates unchanged positioning of the cholecystostomy tube. Multiple radiopaque gallstones are noted about the coil of the cholecystostomy tube.  Initial contrast injection demonstrates minimal opacification of the neck of the gallbladder as well as surrounding the radiopaque gallstones with subsequent injection demonstrating extravasation of contrast about the gallbladder fossa.  The cystic duct is not identified.  IMPRESSION: 1. The existing a percutaneous cholecystostomy tube appears appropriately positioned. No exchange performed. 2. Findings of cholelithiasis within the markedly decompressed gallbladder with extravasation of contrast about the gallbladder fossa. There is no definitive opacification  of the cystic duct. Above findings were discussed with Dr. Andrey CampanileWilson (covering for Dr. Dwain SarnaWakefield), who verbally acknowledged these results.  PLAN: The existing cholecystostomy tube will remain connected to a gravity bag until the patient's impending laparoscopic cholecystectomy scheduled for early next month.   Electronically Signed   By: Simonne ComeJohn  Watts M.D.   On: 11/29/2013 12:56   Ir Fluoro Rm 30-60 Min  11/09/2013   CLINICAL DATA:  68 year old with cholecystostomy tube and biloma drain. There has been minimal output from the biloma drain and recent CT demonstrated that the biloma collection has essentially resolved.  EXAM: IR FLOURO RM 0-60 MIN AND REMOVAL OF DRAINAGE CATHETER  Physician: Rachelle HoraAdam R. Lowella DandyHenn, MD  FLUOROSCOPY TIME:  None  MEDICATIONS: None  ANESTHESIA/SEDATION: Moderate sedation time: None  PROCEDURE: The suture was removed from the biloma drain. The catheter was cut and completely removed. Dressing was placed over the catheter exit site.  FINDINGS: Complete removal of the biloma drain. The cholecystostomy tube is intact and dark bile was identified in the drainage bag.  COMPLICATIONS: None  IMPRESSION: Successful removal of the biloma drain.   Electronically Signed   By: Richarda OverlieAdam  Henn M.D.   On: 11/09/2013  16:23     Review of Systems  Constitutional: Negative for fever, chills and diaphoresis.  HENT: Negative for sore throat and trouble swallowing.   Eyes: Negative for photophobia and visual disturbance.  Respiratory: Negative for choking and shortness of breath.   Cardiovascular: Negative for chest pain and palpitations.  Gastrointestinal: Positive for abdominal pain. Negative for nausea, vomiting, diarrhea, constipation, blood in stool, abdominal distention, anal bleeding and rectal pain.  Genitourinary: Negative for dysuria, urgency, difficulty urinating and testicular pain.  Musculoskeletal: Negative for arthralgias, gait problem, myalgias and neck pain.  Skin: Negative for color change and rash.  Neurological: Negative for dizziness, speech difficulty, weakness and numbness.  Hematological: Negative for adenopathy.  Psychiatric/Behavioral: Negative for hallucinations, confusion and agitation.       Objective:   Physical Exam  Constitutional: He is oriented to person, place, and time. He appears well-developed and well-nourished. No distress.  HENT:  Head: Normocephalic.  Mouth/Throat: Oropharynx is clear and moist. No oropharyngeal exudate.  Eyes: Conjunctivae and EOM are normal. Pupils are equal, round, and reactive to light. No scleral icterus.  Neck: Normal range of motion. No tracheal deviation present.  Cardiovascular: Normal rate, normal heart sounds and intact distal pulses.   Pulmonary/Chest: Effort normal. No respiratory distress.  Abdominal: Soft. He exhibits no distension and no ascites. There is tenderness in the epigastric area. There is no rigidity, no rebound, no guarding, no tenderness at McBurney's point and negative Murphy's sign. No hernia. Hernia confirmed negative in the ventral area, confirmed negative in the right inguinal area and confirmed negative in the left inguinal area.    Musculoskeletal: Normal range of motion. He exhibits no tenderness.    Neurological: He is alert and oriented to person, place, and time. No cranial nerve deficit. He exhibits normal muscle tone. Coordination normal.  Skin: Skin is warm and dry. No rash noted. He is not diaphoretic.  Psychiatric: He has a normal mood and affect. His behavior is normal.       Assessment:     Probable pain due to extravasation of contrast around the tube into right upper quadrant peritoneal cavity from drain study today.  Markedly improved     Plan:     Because he is rapidly improved, this seems reassuring.  If he  has worsening pain or discomfort, restart Augmentin antibiotics.  If markedly worse, repeat CT scan and blood work.  Hopefully less likely with rapid improvement in Sx  At some point patient would benefit from cholecystectomy.  Try laparoscopic.  I agree increased likelihood of  conversion to open higher in the situation with liver abscess and probable cirrhosis.  Tentative plan is OR in mid July, >6weeks from drainage.  Awaiting cardiac clearance.  Verbal report from cardiology sounds like patient can come off warfarin 4 days prior to surgery since the INR tends to be around 2.  We will await for official letter and then schedule.  Orders have already been placed.

## 2013-11-30 ENCOUNTER — Other Ambulatory Visit (INDEPENDENT_AMBULATORY_CARE_PROVIDER_SITE_OTHER): Payer: Self-pay | Admitting: General Surgery

## 2013-11-30 ENCOUNTER — Telehealth (INDEPENDENT_AMBULATORY_CARE_PROVIDER_SITE_OTHER): Payer: Self-pay | Admitting: General Surgery

## 2013-11-30 NOTE — Telephone Encounter (Signed)
Spoke with pt and informed him that we had received Dr. York Spaniel cardiac clearance letter.  Informed him that he has been instructed to hold his warfarin 4 days preop without a bridge.  The cardiac clearance is in EPIC under Dr. York Spaniel documentation letter on 11/29/13.  The patient verbalized understanding of his preop instructions for his clearance.  Informed him that the surgery order forms have been sent around to our surgery schedulers and that they will call him in the next couple of days to set up the surgery date over the phone.

## 2013-12-02 ENCOUNTER — Encounter: Payer: Self-pay | Admitting: Internal Medicine

## 2013-12-14 ENCOUNTER — Encounter (HOSPITAL_COMMUNITY): Payer: Self-pay | Admitting: Pharmacy Technician

## 2013-12-21 ENCOUNTER — Encounter (HOSPITAL_COMMUNITY)
Admission: RE | Admit: 2013-12-21 | Discharge: 2013-12-21 | Disposition: A | Discharge status: Home / Self Care | Payer: Medicare Other | Source: Ambulatory Visit | Attending: Anesthesiology | Admitting: Anesthesiology

## 2013-12-21 ENCOUNTER — Encounter (HOSPITAL_COMMUNITY)
Admission: RE | Admit: 2013-12-21 | Discharge: 2013-12-21 | Disposition: A | Discharge status: Home / Self Care | Payer: Medicare Other | Source: Ambulatory Visit | Attending: General Surgery | Admitting: General Surgery

## 2013-12-21 ENCOUNTER — Encounter (HOSPITAL_COMMUNITY): Payer: Self-pay

## 2013-12-21 HISTORY — DX: Anemia, unspecified: D64.9

## 2013-12-21 HISTORY — DX: Personal history of urinary calculi: Z87.442

## 2013-12-21 LAB — COMPREHENSIVE METABOLIC PANEL
ALT: 10 U/L (ref 0–53)
ANION GAP: 12 (ref 5–15)
AST: 18 U/L (ref 0–37)
Albumin: 4.4 g/dL (ref 3.5–5.2)
Alkaline Phosphatase: 77 U/L (ref 39–117)
BUN: 11 mg/dL (ref 6–23)
CALCIUM: 10.1 mg/dL (ref 8.4–10.5)
CO2: 29 meq/L (ref 19–32)
CREATININE: 1.07 mg/dL (ref 0.50–1.35)
Chloride: 102 mEq/L (ref 96–112)
GFR calc Af Amer: 80 mL/min — ABNORMAL LOW (ref >90)
GFR, EST NON AFRICAN AMERICAN: 69 mL/min — AB (ref >90)
Glucose, Bld: 104 mg/dL — ABNORMAL HIGH (ref 70–99)
Potassium: 4 mEq/L (ref 3.7–5.3)
Sodium: 143 mEq/L (ref 137–147)
Total Bilirubin: 0.6 mg/dL (ref 0.3–1.2)
Total Protein: 7.5 g/dL (ref 6.0–8.3)

## 2013-12-21 LAB — CBC WITH DIFFERENTIAL/PLATELET
BASOS ABS: 0 10*3/uL (ref 0.0–0.1)
Basophils Relative: 0 % (ref 0–1)
Eosinophils Absolute: 0.2 10*3/uL (ref 0.0–0.7)
Eosinophils Relative: 2 % (ref 0–5)
HCT: 38.4 % — ABNORMAL LOW (ref 39.0–52.0)
Hemoglobin: 12.2 g/dL — ABNORMAL LOW (ref 13.0–17.0)
LYMPHS ABS: 2.3 10*3/uL (ref 0.7–4.0)
LYMPHS PCT: 25 % (ref 12–46)
MCH: 28.4 pg (ref 26.0–34.0)
MCHC: 31.8 g/dL (ref 30.0–36.0)
MCV: 89.3 fL (ref 78.0–100.0)
Monocytes Absolute: 1 10*3/uL (ref 0.1–1.0)
Monocytes Relative: 10 % (ref 3–12)
NEUTROS PCT: 63 % (ref 43–77)
Neutro Abs: 5.9 10*3/uL (ref 1.7–7.7)
PLATELETS: 179 10*3/uL (ref 150–400)
RBC: 4.3 MIL/uL (ref 4.22–5.81)
RDW: 15.2 % (ref 11.5–15.5)
WBC: 9.4 10*3/uL (ref 4.0–10.5)

## 2013-12-21 LAB — APTT: aPTT: 39 seconds — ABNORMAL HIGH (ref 24–37)

## 2013-12-21 LAB — PROTIME-INR
INR: 1.29 (ref 0.00–1.49)
PROTHROMBIN TIME: 16.1 s — AB (ref 11.6–15.2)

## 2013-12-21 MED ORDER — CIPROFLOXACIN IN D5W 400 MG/200ML IV SOLN
400.0000 mg | INTRAVENOUS | Status: AC
  Administered 2013-12-22: 400 mg via INTRAVENOUS
  Filled 2013-12-21: qty 200

## 2013-12-21 NOTE — Progress Notes (Addendum)
Anesthesia Chart Review:  Patient is a 68 year old male from [facility] who is scheduled for laparoscopic, possibly open, cholecystectomy tomorrow by Dr. Dwain Sarna. His cardiologist are in [place], so he elected to have his surgery here.  History includes former smoker, afib with cardioversion '04/05, complete heart block, CAD/anterior MI s/p CABG with redo (CABG w/SVGs to LAD Diag 1 Diag 2 OM 1 PDA 1982, CABG w/LIMA and SVGs to Diag 1 PDA January 1996), CHF, cardiomyopathy, VT (last ~ 2010), [employer] AICD '05 with change out 10/08/12 with placement of a new RV lead (Dr. Ladona Ridgel), DM2, asthma, hypercholesterolemia, nephrolithiasis, cirrhosis (by 10/31/13 CT), anxiety. Admission for acute cholecystitis with abscess and RML PNA and sepsis 10/2013 treated with antibiotics and gallbladder drain with plans for cholecystectomy at a later date.  PCP listed in notes is Dr. Bernadette Hoit.    Cardiologist is Dr. Donnie Aho who wrote on 11/29/13, "From a cardiovascular viewpoint may proceed with the planned gallbladder operation. His anesthetic risk is increased due to cirrhosis as well as his underlying cardiac condition but his cardiac conditions have been stable and his heart failure is not decompensated. At the time of surgery he may discontinue his warfarin 4 days prior to surgery without bridging. Indications for warfarin are chronic atrial fibrillation. In addition his defibrillator function should be deactivated during surgery and turned back on after surgery by the representative. He should continue the pacing function with surgery. Let me know when his surgery is and I will be happy to follow him with you in the hospital."  EKG on 10/26/13 showed: V-paced rhythm. Rhythm looks fairly regular, but I don't see consistent p waves.   Echo on 04/29/12 (Dr. Marygrace Drought) showed: Dilated and hypertrophied LV with anteroseptal and inferior wall hypokinesis and EF 30%.  Marked LA enlargement. Trace TR. Dilated IVC with reduced  inspiratory variation.  By notes, "He had patent grafts noted at catheterization in July of 2008. His native coronary arteries were occluded. His ventricular function has shown an EF of around 35%." (His 2008 cath report can be found under the Notes tab in Epic.)  CXR on 12/21/13 showed: 1. No active process. 2. Stable pleuro parenchymal scarring at the left lung base. 3. Stable cardiomegaly with AICD leads.   Preoperative labs noted. Dr. Donnie Aho had him hold Coumadin four days preoperatively.    Dr. Donnie Aho instructed that defibrillator function be deactivated pre-operatively with reactivation post-operatively. His PAT RN has already notified Leta Jungling with [employer].    Velna Ochs [facility] Short Stay Center/Anesthesiology Phone [phone] 12/21/2013 2:53 PM

## 2013-12-21 NOTE — Progress Notes (Signed)
Jana Half [employer] rep called. To deactivate icd prior to surgery per dr York Spaniel note 11/29/13 in epic.to be in [facility] at 9am to do so.

## 2013-12-21 NOTE — Pre-Procedure Instructions (Signed)
MIHCAEL BOUCHIE Sr.  12/21/2013   Your procedure is scheduled on:  12/22/13  Report to [facility] cone short stay admitting at 800 AM.  Call this number if you have problems the morning of surgery: [phone]   Remember:   Do not eat food or drink liquids after midnight.   Take these medicines the morning of surgery with A SIP OF WATER: eye drops, metoprolol       Take all meds until day of surgery except as instructed below or per dr      Despina Arias all herbel meds, nsaids (aleve,naproxen,advil,ibuprofen) now including aspirin, vitamins, coq10   Coumadin per dr(12/18/12)   Do not wear jewelry, make-up or nail polish.  Do not wear lotions, powders, or perfumes. You may wear deodorant.  Do not shave 48 hours prior to surgery. Men may shave face and neck.  Do not bring valuables to the hospital.  [facility] is not responsible                  for any belongings or valuables.               Contacts, dentures or bridgework may not be worn into surgery.  Leave suitcase in the car. After surgery it may be brought to your room.  For patients admitted to the hospital, discharge time is determined by your                treatment team.               Patients discharged the day of surgery will not be allowed to drive  home.  Name and phone number of your driver:   Special Instructions:  Special Instructions: [place] - Preparing for Surgery  Before surgery, you can play an important role.  Because skin is not sterile, your skin needs to be as free of germs as possible.  You can reduce the number of germs on you skin by washing with CHG (chlorahexidine gluconate) soap before surgery.  CHG is an antiseptic cleaner which kills germs and bonds with the skin to continue killing germs even after washing.  Please DO NOT use if you have an allergy to CHG or antibacterial soaps.  If your skin becomes reddened/irritated stop using the CHG and inform your nurse when you arrive at Short Stay.  Do not shave (including  legs and underarms) for at least 48 hours prior to the first CHG shower.  You may shave your face.  Please follow these instructions carefully:   1.  Shower with CHG Soap the night before surgery and the morning of Surgery.  2.  If you choose to wash your hair, wash your hair first as usual with your normal shampoo.  3.  After you shampoo, rinse your hair and body thoroughly to remove the Shampoo.  4.  Use CHG as you would any other liquid soap.  You can apply chg directly  to the skin and wash gently with scrungie or a clean washcloth.  5.  Apply the CHG Soap to your body ONLY FROM THE NECK DOWN.  Do not use on open wounds or open sores.  Avoid contact with your eyes ears, mouth and genitals (private parts).  Wash genitals (private parts)       with your normal soap.  6.  Wash thoroughly, paying special attention to the area where your surgery will be performed.  7.  Thoroughly rinse your body with warm water from  the neck down.  8.  DO NOT shower/wash with your normal soap after using and rinsing off the CHG Soap.  9.  Pat yourself dry with a clean towel.            10.  Wear clean pajamas.            11.  Place clean sheets on your bed the night of your first shower and do not sleep with pets.  Day of Surgery  Do not apply any lotions/deodorants the morning of surgery.  Please wear clean clothes to the hospital/surgery center.   Please read over the following fact sheets that you were given: Pain Booklet, Coughing and Deep Breathing and Surgical Site Infection Prevention

## 2013-12-22 ENCOUNTER — Encounter (HOSPITAL_COMMUNITY): Payer: Self-pay | Admitting: Anesthesiology

## 2013-12-22 ENCOUNTER — Ambulatory Visit (HOSPITAL_COMMUNITY): Payer: Medicare Other | Admitting: Anesthesiology

## 2013-12-22 ENCOUNTER — Encounter (HOSPITAL_COMMUNITY): Payer: Medicare Other | Admitting: Vascular Surgery

## 2013-12-22 ENCOUNTER — Inpatient Hospital Stay (HOSPITAL_COMMUNITY)
Admission: RE | Admit: 2013-12-22 | Discharge: 2013-12-24 | DRG: 418 | Disposition: A | Discharge status: Home / Self Care | Payer: Medicare Other | Source: Ambulatory Visit | Attending: General Surgery | Admitting: General Surgery

## 2013-12-22 ENCOUNTER — Encounter (HOSPITAL_COMMUNITY)
Admission: RE | Disposition: A | Discharge status: Home / Self Care | Payer: Self-pay | Source: Ambulatory Visit | Attending: General Surgery

## 2013-12-22 DIAGNOSIS — E119 Type 2 diabetes mellitus without complications: Secondary | ICD-10-CM | POA: Diagnosis present

## 2013-12-22 DIAGNOSIS — Z7901 Long term (current) use of anticoagulants: Secondary | ICD-10-CM

## 2013-12-22 DIAGNOSIS — Z01812 Encounter for preprocedural laboratory examination: Secondary | ICD-10-CM

## 2013-12-22 DIAGNOSIS — Z79899 Other long term (current) drug therapy: Secondary | ICD-10-CM

## 2013-12-22 DIAGNOSIS — Y9210 Unspecified residential institution as the place of occurrence of the external cause: Secondary | ICD-10-CM | POA: Diagnosis not present

## 2013-12-22 DIAGNOSIS — I4949 Other premature depolarization: Secondary | ICD-10-CM | POA: Diagnosis not present

## 2013-12-22 DIAGNOSIS — Z01818 Encounter for other preprocedural examination: Secondary | ICD-10-CM

## 2013-12-22 DIAGNOSIS — I5022 Chronic systolic (congestive) heart failure: Secondary | ICD-10-CM | POA: Diagnosis present

## 2013-12-22 DIAGNOSIS — I4891 Unspecified atrial fibrillation: Secondary | ICD-10-CM | POA: Diagnosis not present

## 2013-12-22 DIAGNOSIS — I2510 Atherosclerotic heart disease of native coronary artery without angina pectoris: Secondary | ICD-10-CM | POA: Diagnosis present

## 2013-12-22 DIAGNOSIS — Z951 Presence of aortocoronary bypass graft: Secondary | ICD-10-CM

## 2013-12-22 DIAGNOSIS — Z01810 Encounter for preprocedural cardiovascular examination: Secondary | ICD-10-CM

## 2013-12-22 DIAGNOSIS — I509 Heart failure, unspecified: Secondary | ICD-10-CM | POA: Diagnosis present

## 2013-12-22 DIAGNOSIS — K810 Acute cholecystitis: Principal | ICD-10-CM | POA: Diagnosis present

## 2013-12-22 DIAGNOSIS — Z87891 Personal history of nicotine dependence: Secondary | ICD-10-CM

## 2013-12-22 DIAGNOSIS — Z95810 Presence of automatic (implantable) cardiac defibrillator: Secondary | ICD-10-CM

## 2013-12-22 DIAGNOSIS — K8000 Calculus of gallbladder with acute cholecystitis without obstruction: Secondary | ICD-10-CM | POA: Diagnosis present

## 2013-12-22 DIAGNOSIS — I10 Essential (primary) hypertension: Secondary | ICD-10-CM | POA: Diagnosis present

## 2013-12-22 DIAGNOSIS — I428 Other cardiomyopathies: Secondary | ICD-10-CM | POA: Diagnosis present

## 2013-12-22 DIAGNOSIS — I252 Old myocardial infarction: Secondary | ICD-10-CM

## 2013-12-22 DIAGNOSIS — K8010 Calculus of gallbladder with chronic cholecystitis without obstruction: Secondary | ICD-10-CM

## 2013-12-22 DIAGNOSIS — Y836 Removal of other organ (partial) (total) as the cause of abnormal reaction of the patient, or of later complication, without mention of misadventure at the time of the procedure: Secondary | ICD-10-CM | POA: Diagnosis not present

## 2013-12-22 DIAGNOSIS — IMO0002 Reserved for concepts with insufficient information to code with codable children: Secondary | ICD-10-CM | POA: Diagnosis not present

## 2013-12-22 HISTORY — PX: CHOLECYSTECTOMY: SHX55

## 2013-12-22 LAB — GLUCOSE, CAPILLARY: Glucose-Capillary: 185 mg/dL — ABNORMAL HIGH (ref 70–99)

## 2013-12-22 SURGERY — LAPAROSCOPIC CHOLECYSTECTOMY
Anesthesia: General | Site: Abdomen

## 2013-12-22 MED ORDER — ACETAMINOPHEN 325 MG PO TABS: 650.0000 mg | ORAL_TABLET | Freq: Four times a day (QID) | ORAL | Status: DC | PRN

## 2013-12-22 MED ORDER — IRBESARTAN 150 MG PO TABS
150.0000 mg | ORAL_TABLET | Freq: Every day | ORAL | Status: DC
  Administered 2013-12-23 – 2013-12-24 (×2): 150 mg via ORAL
  Filled 2013-12-22 (×2): qty 1

## 2013-12-22 MED ORDER — LACTATED RINGERS IV SOLN
INTRAVENOUS | Status: DC | PRN
  Administered 2013-12-22 (×2): via INTRAVENOUS

## 2013-12-22 MED ORDER — ACETAMINOPHEN 650 MG RE SUPP: 650.0000 mg | Freq: Four times a day (QID) | RECTAL | Status: DC | PRN

## 2013-12-22 MED ORDER — 0.9 % SODIUM CHLORIDE (POUR BTL) OPTIME
TOPICAL | Status: DC | PRN
  Administered 2013-12-22: 1000 mL

## 2013-12-22 MED ORDER — ONDANSETRON HCL 4 MG/2ML IJ SOLN
INTRAMUSCULAR | Status: DC | PRN
  Administered 2013-12-22: 4 mg via INTRAVENOUS

## 2013-12-22 MED ORDER — LACTATED RINGERS IV SOLN
INTRAVENOUS | Status: DC
  Administered 2013-12-22: 09:00:00 via INTRAVENOUS

## 2013-12-22 MED ORDER — METOCLOPRAMIDE HCL 5 MG/ML IJ SOLN: 10.0000 mg | Freq: Once | INTRAMUSCULAR | Status: DC | PRN

## 2013-12-22 MED ORDER — HYDROMORPHONE HCL PF 1 MG/ML IJ SOLN
0.2500 mg | INTRAMUSCULAR | Status: DC | PRN
  Administered 2013-12-22 (×2): 0.5 mg via INTRAVENOUS

## 2013-12-22 MED ORDER — ARTIFICIAL TEARS OP OINT
TOPICAL_OINTMENT | OPHTHALMIC | Status: AC
  Filled 2013-12-22: qty 3.5

## 2013-12-22 MED ORDER — OXYCODONE HCL 5 MG PO TABS: 5.0000 mg | ORAL_TABLET | Freq: Once | ORAL | Status: DC | PRN

## 2013-12-22 MED ORDER — METRONIDAZOLE IN NACL 5-0.79 MG/ML-% IV SOLN
500.0000 mg | Freq: Three times a day (TID) | INTRAVENOUS | Status: DC
  Administered 2013-12-22 – 2013-12-24 (×5): 500 mg via INTRAVENOUS
  Filled 2013-12-22 (×7): qty 100

## 2013-12-22 MED ORDER — HYDROMORPHONE HCL PF 1 MG/ML IJ SOLN
INTRAMUSCULAR | Status: AC
  Administered 2013-12-22: 0.5 mg via INTRAVENOUS
  Filled 2013-12-22: qty 1

## 2013-12-22 MED ORDER — OXYCODONE HCL 5 MG PO TABS
5.0000 mg | ORAL_TABLET | ORAL | Status: DC | PRN
  Administered 2013-12-22: 5 mg via ORAL
  Filled 2013-12-22: qty 1

## 2013-12-22 MED ORDER — SODIUM CHLORIDE 0.9 % IR SOLN
Status: DC | PRN
  Administered 2013-12-22: 1000 mL

## 2013-12-22 MED ORDER — GLYCOPYRROLATE 0.2 MG/ML IJ SOLN
INTRAMUSCULAR | Status: DC | PRN
  Administered 2013-12-22: 0.6 mg via INTRAVENOUS

## 2013-12-22 MED ORDER — SODIUM CHLORIDE 0.9 % IJ SOLN
INTRAMUSCULAR | Status: AC
  Filled 2013-12-22: qty 10

## 2013-12-22 MED ORDER — WARFARIN - PHYSICIAN DOSING INPATIENT: Freq: Every day | Status: DC

## 2013-12-22 MED ORDER — OXYCODONE HCL 5 MG/5ML PO SOLN: 5.0000 mg | Freq: Once | ORAL | Status: DC | PRN

## 2013-12-22 MED ORDER — CIPROFLOXACIN IN D5W 400 MG/200ML IV SOLN
400.0000 mg | Freq: Two times a day (BID) | INTRAVENOUS | Status: DC
  Administered 2013-12-22 – 2013-12-23 (×2): 400 mg via INTRAVENOUS
  Filled 2013-12-22 (×3): qty 200

## 2013-12-22 MED ORDER — ENOXAPARIN SODIUM 40 MG/0.4ML ~~LOC~~ SOLN
40.0000 mg | SUBCUTANEOUS | Status: DC
  Administered 2013-12-22: 40 mg via SUBCUTANEOUS
  Filled 2013-12-22 (×2): qty 0.4

## 2013-12-22 MED ORDER — FENTANYL CITRATE 0.05 MG/ML IJ SOLN
INTRAMUSCULAR | Status: AC
  Filled 2013-12-22: qty 5

## 2013-12-22 MED ORDER — LIDOCAINE HCL (CARDIAC) 20 MG/ML IV SOLN
INTRAVENOUS | Status: DC | PRN
  Administered 2013-12-22: 80 mg via INTRAVENOUS

## 2013-12-22 MED ORDER — GLYCOPYRROLATE 0.2 MG/ML IJ SOLN
INTRAMUSCULAR | Status: AC
  Filled 2013-12-22: qty 3

## 2013-12-22 MED ORDER — ONDANSETRON HCL 4 MG/2ML IJ SOLN: 4.0000 mg | Freq: Four times a day (QID) | INTRAMUSCULAR | Status: DC | PRN

## 2013-12-22 MED ORDER — LIDOCAINE HCL (CARDIAC) 20 MG/ML IV SOLN
INTRAVENOUS | Status: AC
  Filled 2013-12-22: qty 5

## 2013-12-22 MED ORDER — SODIUM CHLORIDE 0.9 % IV SOLN
INTRAVENOUS | Status: DC
  Administered 2013-12-22: 18:00:00 via INTRAVENOUS

## 2013-12-22 MED ORDER — ONDANSETRON HCL 4 MG/2ML IJ SOLN
INTRAMUSCULAR | Status: AC
  Filled 2013-12-22: qty 2

## 2013-12-22 MED ORDER — WARFARIN SODIUM 5 MG PO TABS
5.0000 mg | ORAL_TABLET | Freq: Every day | ORAL | Status: DC
  Administered 2013-12-22: 5 mg via ORAL
  Filled 2013-12-22 (×2): qty 1

## 2013-12-22 MED ORDER — BUPIVACAINE-EPINEPHRINE (PF) 0.25% -1:200000 IJ SOLN
INTRAMUSCULAR | Status: AC
  Filled 2013-12-22: qty 30

## 2013-12-22 MED ORDER — BUPIVACAINE-EPINEPHRINE 0.25% -1:200000 IJ SOLN
INTRAMUSCULAR | Status: DC | PRN
  Administered 2013-12-22: 30 mL

## 2013-12-22 MED ORDER — NEOSTIGMINE METHYLSULFATE 10 MG/10ML IV SOLN
INTRAVENOUS | Status: AC
  Filled 2013-12-22: qty 1

## 2013-12-22 MED ORDER — ARTIFICIAL TEARS OP OINT
TOPICAL_OINTMENT | OPHTHALMIC | Status: DC | PRN
  Administered 2013-12-22: 1 via OPHTHALMIC

## 2013-12-22 MED ORDER — NEOSTIGMINE METHYLSULFATE 10 MG/10ML IV SOLN
INTRAVENOUS | Status: DC | PRN
  Administered 2013-12-22: 4 mg via INTRAVENOUS

## 2013-12-22 MED ORDER — FUROSEMIDE 40 MG PO TABS
40.0000 mg | ORAL_TABLET | Freq: Two times a day (BID) | ORAL | Status: DC
  Administered 2013-12-23 – 2013-12-24 (×3): 40 mg via ORAL
  Filled 2013-12-22 (×6): qty 1

## 2013-12-22 MED ORDER — MIDAZOLAM HCL 2 MG/2ML IJ SOLN
INTRAMUSCULAR | Status: AC
  Filled 2013-12-22: qty 2

## 2013-12-22 MED ORDER — FENTANYL CITRATE 0.05 MG/ML IJ SOLN
INTRAMUSCULAR | Status: DC | PRN
  Administered 2013-12-22 (×2): 50 ug via INTRAVENOUS
  Administered 2013-12-22: 100 ug via INTRAVENOUS
  Administered 2013-12-22: 50 ug via INTRAVENOUS

## 2013-12-22 MED ORDER — PHENYLEPHRINE HCL 10 MG/ML IJ SOLN
10.0000 mg | INTRAVENOUS | Status: DC | PRN
  Administered 2013-12-22: 15 ug/min via INTRAVENOUS

## 2013-12-22 MED ORDER — PROPOFOL 10 MG/ML IV BOLUS
INTRAVENOUS | Status: AC
  Filled 2013-12-22: qty 20

## 2013-12-22 MED ORDER — PROPOFOL 10 MG/ML IV BOLUS
INTRAVENOUS | Status: DC | PRN
  Administered 2013-12-22: 120 mg via INTRAVENOUS

## 2013-12-22 MED ORDER — METOPROLOL TARTRATE 50 MG PO TABS
50.0000 mg | ORAL_TABLET | Freq: Two times a day (BID) | ORAL | Status: DC
  Administered 2013-12-22: 25 mg via ORAL
  Administered 2013-12-23 – 2013-12-24 (×3): 50 mg via ORAL
  Filled 2013-12-22 (×6): qty 1

## 2013-12-22 MED ORDER — MORPHINE SULFATE 2 MG/ML IJ SOLN: 2.0000 mg | INTRAMUSCULAR | Status: DC | PRN

## 2013-12-22 MED ORDER — PANTOPRAZOLE SODIUM 40 MG IV SOLR
40.0000 mg | Freq: Every day | INTRAVENOUS | Status: DC
  Administered 2013-12-22: 40 mg via INTRAVENOUS
  Filled 2013-12-22 (×2): qty 40

## 2013-12-22 MED ORDER — ROCURONIUM BROMIDE 100 MG/10ML IV SOLN
INTRAVENOUS | Status: DC | PRN
  Administered 2013-12-22: 40 mg via INTRAVENOUS

## 2013-12-22 MED ORDER — ROCURONIUM BROMIDE 50 MG/5ML IV SOLN
INTRAVENOUS | Status: AC
  Filled 2013-12-22: qty 1

## 2013-12-22 MED ORDER — BENAZEPRIL HCL 20 MG PO TABS
20.0000 mg | ORAL_TABLET | Freq: Two times a day (BID) | ORAL | Status: DC
  Administered 2013-12-23 – 2013-12-24 (×3): 20 mg via ORAL
  Filled 2013-12-22 (×5): qty 1

## 2013-12-22 SURGICAL SUPPLY — 47 items
APPLIER CLIP 5 13 M/L LIGAMAX5 (MISCELLANEOUS) ×3
BLADE SURG ROTATE 9660 (MISCELLANEOUS) ×3 IMPLANT
CANISTER SUCTION 2500CC (MISCELLANEOUS) ×3 IMPLANT
CHLORAPREP W/TINT 26ML (MISCELLANEOUS) ×6 IMPLANT
CLIP APPLIE 5 13 M/L LIGAMAX5 (MISCELLANEOUS) ×1 IMPLANT
CLOSURE WOUND 1/4 X3 (GAUZE/BANDAGES/DRESSINGS) ×1
COVER MAYO STAND STRL (DRAPES) IMPLANT
COVER SURGICAL LIGHT HANDLE (MISCELLANEOUS) ×3 IMPLANT
DECANTER SPIKE VIAL GLASS SM (MISCELLANEOUS) IMPLANT
DERMABOND ADVANCED (GAUZE/BANDAGES/DRESSINGS) ×2
DERMABOND ADVANCED .7 DNX12 (GAUZE/BANDAGES/DRESSINGS) ×1 IMPLANT
DRAPE C-ARM 42X72 X-RAY (DRAPES) IMPLANT
ELECT REM PT RETURN 9FT ADLT (ELECTROSURGICAL) ×3
ELECTRODE REM PT RTRN 9FT ADLT (ELECTROSURGICAL) ×1 IMPLANT
GAUZE SPONGE 2X2 8PLY STRL LF (GAUZE/BANDAGES/DRESSINGS) ×1 IMPLANT
GLOVE BIO SURGEON STRL SZ 6 (GLOVE) ×3 IMPLANT
GLOVE BIO SURGEON STRL SZ7 (GLOVE) ×3 IMPLANT
GLOVE BIO SURGEON STRL SZ7.5 (GLOVE) ×3 IMPLANT
GLOVE BIOGEL PI IND STRL 7.5 (GLOVE) ×3 IMPLANT
GLOVE BIOGEL PI INDICATOR 7.5 (GLOVE) ×6
GLOVE INDICATOR 6.5 STRL GRN (GLOVE) ×3 IMPLANT
GLOVE SURG SS PI 7.0 STRL IVOR (GLOVE) ×3 IMPLANT
GOWN STRL REUS W/ TWL LRG LVL3 (GOWN DISPOSABLE) ×4 IMPLANT
GOWN STRL REUS W/ TWL XL LVL3 (GOWN DISPOSABLE) ×1 IMPLANT
GOWN STRL REUS W/TWL 2XL LVL3 (GOWN DISPOSABLE) ×3 IMPLANT
GOWN STRL REUS W/TWL LRG LVL3 (GOWN DISPOSABLE) ×8
GOWN STRL REUS W/TWL XL LVL3 (GOWN DISPOSABLE) ×2
HEMOSTAT SNOW SURGICEL 2X4 (HEMOSTASIS) ×3 IMPLANT
KIT BASIN OR (CUSTOM PROCEDURE TRAY) ×3 IMPLANT
KIT ROOM TURNOVER OR (KITS) ×3 IMPLANT
NS IRRIG 1000ML POUR BTL (IV SOLUTION) ×3 IMPLANT
PAD ARMBOARD 7.5X6 YLW CONV (MISCELLANEOUS) ×3 IMPLANT
POUCH RETRIEVAL ECOSAC 10 (ENDOMECHANICALS) ×1 IMPLANT
POUCH RETRIEVAL ECOSAC 10MM (ENDOMECHANICALS) ×2
SCISSORS LAP 5X35 DISP (ENDOMECHANICALS) ×3 IMPLANT
SET CHOLANGIOGRAPH 5 50 .035 (SET/KITS/TRAYS/PACK) IMPLANT
SET IRRIG TUBING LAPAROSCOPIC (IRRIGATION / IRRIGATOR) ×3 IMPLANT
SLEEVE ENDOPATH XCEL 5M (ENDOMECHANICALS) ×6 IMPLANT
SPECIMEN JAR SMALL (MISCELLANEOUS) ×3 IMPLANT
SPONGE GAUZE 2X2 STER 10/PKG (GAUZE/BANDAGES/DRESSINGS) ×2
STRIP CLOSURE SKIN 1/4X3 (GAUZE/BANDAGES/DRESSINGS) ×2 IMPLANT
SUT MNCRL AB 4-0 PS2 18 (SUTURE) ×3 IMPLANT
TOWEL OR 17X24 6PK STRL BLUE (TOWEL DISPOSABLE) ×3 IMPLANT
TOWEL OR 17X26 10 PK STRL BLUE (TOWEL DISPOSABLE) ×3 IMPLANT
TRAY LAPAROSCOPIC (CUSTOM PROCEDURE TRAY) ×3 IMPLANT
TROCAR XCEL BLUNT TIP 100MML (ENDOMECHANICALS) ×3 IMPLANT
TROCAR XCEL NON-BLD 5MMX100MML (ENDOMECHANICALS) ×3 IMPLANT

## 2013-12-22 NOTE — Anesthesia Postprocedure Evaluation (Signed)
Anesthesia Post Note  Patient: Spencer Shelton  Procedure(s) Performed: Procedure(s) (LRB): LAPAROSCOPIC CHOLECYSTECTOMY, POSSIBLE OPEN (N/A)  Anesthesia type: General  Patient location: PACU  Post pain: Pain level controlled  Post assessment: Patient's Cardiovascular Status Stable  Last Vitals:  Filed Vitals:   12/22/13 1315  BP: 111/54  Pulse: 70  Temp:   Resp: 21    Post vital signs: Reviewed and stable  Level of consciousness: alert  Complications: No apparent anesthesia complications

## 2013-12-22 NOTE — Progress Notes (Signed)
PHARMACIST - PHYSICIAN COMMUNICATION  CONCERNING: Pharmacy Care Issues Regarding Warfarin Labs  RECOMMENDATION (Action Taken): A baseline and daily protime for three days has been ordered to meet the [facility] Patient safety goal and comply with the current [facility] Pharmacy & Therapeutics Committee policy.   The Pharmacy will defer all warfarin dose order changes and follow up of lab results to the prescriber unless an additional order to initiate a "pharmacy Coumadin consult" is placed.  DESCRIPTION:  While hospitalized, to be in compliance with The Joint Commission National Patient Safety Goals, all patients on warfarin must have a baseline and/or current protime prior to the administration of warfarin. Pharmacy has received your order for warfarin without these required laboratory assessments.   *Please consider a entering a Pharmacy consult for coumadin as Cipro and Flagyl will likely effect patient sensitivity to coumadin  Thank you, Harland German, Pharm D 12/22/2013 4:46 PM

## 2013-12-22 NOTE — Discharge Instructions (Signed)
[facility] LAPAROSCOPIC SURGERY: POST OP INSTRUCTIONS  Always review your discharge instruction sheet given to you by the facility where your surgery was performed. IF YOU HAVE DISABILITY OR FAMILY LEAVE FORMS, YOU MUST BRING THEM TO THE OFFICE FOR PROCESSING.   DO NOT GIVE THEM TO YOUR DOCTOR.  1. A prescription for pain medication may be given to you upon discharge.  Take your pain medication as prescribed, if needed.  If narcotic pain medicine is not needed, then you may take acetaminophen (Tylenol), naprosyn (Alleve), or ibuprofen (Advil) as needed. 2. Take your usually prescribed medications unless otherwise directed. 3. If you need a refill on your pain medication, please contact your pharmacy.  They will contact our office to request authorization. Prescriptions will not be filled after 5pm or on week-ends. 4. You should follow a light diet the first few days after arrival home, such as soup and crackers, etc.  Be sure to include lots of fluids daily. 5. Most patients will experience some swelling and bruising in the area of the incisions.  Ice packs will help.  Swelling and bruising can take several days to resolve.  6. It is common to experience some constipation if taking pain medication after surgery.  Increasing fluid intake and taking a stool softener (such as Colace) will usually help or prevent this problem from occurring.  A mild laxative (Milk of Magnesia or Miralax) should be taken according to package instructions if there are no bowel movements after 48 hours. 7. Unless discharge instructions indicate otherwise, you may remove your bandages 48 hours after surgery, and you may shower at that time.  You may have steri-strips (small skin tapes) in place directly over the incision.  These strips should be left on the skin for 7-10 days.  If your surgeon used skin glue on the incision, you may shower in 24 hours.  The glue will flake  off over the next 2-3 weeks.  Any sutures or staples will be removed at the office during your follow-up visit. 8. ACTIVITIES:  You may resume regular (light) daily activities beginning the next day--such as daily self-care, walking, climbing stairs--gradually increasing activities as tolerated.  You may have sexual intercourse when it is comfortable.  Refrain from any heavy lifting or straining until approved by your doctor. a. You may drive when you are no longer taking prescription pain medication, you can comfortably wear a seatbelt, and you can safely maneuver your car and apply brakes. b. RETURN TO WORK:  __________________________________________________________ 9. You should see your doctor in the office for a follow-up appointment approximately 2-3 weeks after your surgery.  Make sure that you call for this appointment within a day or two after you arrive home to insure a convenient appointment time. 10. OTHER INSTRUCTIONS: __________________________________________________________________________________________________________________________ __________________________________________________________________________________________________________________________ WHEN TO CALL YOUR DOCTOR: 1. Fever over 101.0 2. Inability to urinate 3. Continued bleeding from incision. 4. Increased pain, redness, or drainage from the incision. 5. Increasing abdominal pain  The clinic staff is available to answer your questions during regular business hours.  Please don't hesitate to call and ask to speak to one of the nurses for clinical concerns.  If you have a medical emergency, go to the nearest emergency room or call 911.  A surgeon from [facility] is always on call at the hospital. [address], [address], [place], [place]  [address] ? [address] [address], [place], [place]   [address] [phone] ? [phone] ? [phone]   [phone] Web site: [email or web address]  

## 2013-12-22 NOTE — Anesthesia Procedure Notes (Signed)
Procedure Name: Intubation Date/Time: 12/22/2013 10:51 AM Performed by: Fransisca Kaufmann Pre-anesthesia Checklist: Patient identified, Emergency Drugs available, Suction available, Patient being monitored and Timeout performed Patient Re-evaluated:Patient Re-evaluated prior to inductionOxygen Delivery Method: Circle system utilized Preoxygenation: Pre-oxygenation with 100% oxygen Intubation Type: IV induction Ventilation: Mask ventilation without difficulty Laryngoscope Size: Miller and 3 Grade View: Grade I Tube type: Oral Tube size: 8.0 mm Number of attempts: 1 Airway Equipment and Method: Stylet Placement Confirmation: ETT inserted through vocal cords under direct vision,  positive ETCO2 and breath sounds checked- equal and bilateral Secured at: 23 cm Tube secured with: Tape Dental Injury: Teeth and Oropharynx as per pre-operative assessment

## 2013-12-22 NOTE — Anesthesia Preprocedure Evaluation (Signed)
Anesthesia Evaluation  Patient identified by MRN, date of birth, ID band Patient awake    Reviewed: Allergy & Precautions, H&P , NPO status , Patient's Chart, lab work & pertinent test results, reviewed documented beta blocker date and time   Airway Mallampati: II TM Distance: >3 FB Neck ROM: full    Dental   Pulmonary asthma , pneumonia -, resolved, former smoker,  breath sounds clear to auscultation        Cardiovascular hypertension, On Medications and On Home Beta Blockers + CAD, + Past MI, + CABG and +CHF + dysrhythmias Atrial Fibrillation and Ventricular Tachycardia + pacemaker + Cardiac Defibrillator Rhythm:regular     Neuro/Psych negative neurological ROS  negative psych ROS   GI/Hepatic negative GI ROS, (+) Cirrhosis -      ,   Endo/Other  diabetes, Oral Hypoglycemic Agents  Renal/GU negative Renal ROS  negative genitourinary   Musculoskeletal   Abdominal   Peds  Hematology  (+) anemia ,   Anesthesia Other Findings See surgeon's H&P   Reproductive/Obstetrics negative OB ROS                           Anesthesia Physical Anesthesia Plan  ASA: III  Anesthesia Plan: General   Post-op Pain Management:    Induction: Intravenous  Airway Management Planned: Oral ETT  Additional Equipment: Arterial line  Intra-op Plan:   Post-operative Plan: Extubation in OR  Informed Consent: I have reviewed the patients History and Physical, chart, labs and discussed the procedure including the risks, benefits and alternatives for the proposed anesthesia with the patient or authorized representative who has indicated his/her understanding and acceptance.   Dental Advisory Given  Plan Discussed with: CRNA and Surgeon  Anesthesia Plan Comments:         Anesthesia Quick Evaluation

## 2013-12-22 NOTE — Transfer of Care (Signed)
Immediate Anesthesia Transfer of Care Note  Patient: Spencer Shelton  Procedure(s) Performed: Procedure(s): LAPAROSCOPIC CHOLECYSTECTOMY, POSSIBLE OPEN (N/A)  Patient Location: PACU  Anesthesia Type:General  Level of Consciousness: awake, alert , oriented and sedated  Airway & Oxygen Therapy: Patient Spontanous Breathing and Patient connected to nasal cannula oxygen  Post-op Assessment: Report given to PACU RN, Post -op Vital signs reviewed and stable and Patient moving all extremities  Post vital signs: Reviewed and stable  Complications: No apparent anesthesia complications

## 2013-12-22 NOTE — Interval H&P Note (Signed)
History and Physical Interval Note:  12/22/2013 9:36 AM  Spencer Shelton  has presented today for surgery, with the diagnosis of CHOLECYSTITIS  The various methods of treatment have been discussed with the patient and family. After consideration of risks, benefits and other options for treatment, the patient has consented to  Procedure(s): LAPAROSCOPIC CHOLECYSTECTOMY, POSSIBLE OPEN (N/A) as a surgical intervention .  The patient's history has been reviewed, patient examined, no change in status, stable for surgery.  I have reviewed the patient's chart and labs.  Questions were answered to the patient's satisfaction.     Zalman Hull

## 2013-12-22 NOTE — Op Note (Signed)
Preoperative diagnosis: Acute cholecystitis complicated by liver abscess s/p percutaneous drainage Postoperative diagnosis: saa Procedure: laparoscopic cholecystectomy Surgeon: Dr Harden Mo Asst: Dr Almond Lint EBL: 50 cc Anethesia: general Specimen: gb and contents to pathology Complications: none Sponge and needle count correct at completion Disposition to recovery stable  Indications: This is a 32 yom who presented with longstanding cholecystitis and significant comorbidities.  He had a liver abscess also. He underwent percutaneous drainage of the liver abscess and the gallbladder.  He has done well since then.  The liver drain was removed.  His cholecystostomy was still present. We discussed going to or for lap possible open cholecystectomy.  Procedure: After informed consent was obtained the patient was taken to the operating room. He was given antibiotics. Sequential compression devices on his legs. His defibrillator was turned off. He then underwent general anesthesia without complication. His abdomen was prepped and draped in the standard sterile surgical fashion. His percutaneous cholecystostomy was cut short. A surgical timeout was then performed.  I infiltrated Marcaine below the umbilicus.I made a vertical incision. I incised the fascia. I then entered the peritoneum. I then placed a 0 Vicryl pursestring suture to the fascia. I then inserted a Hassan trocar and insufflated the abdomen to 15 mm mercury pressure. I then inserted 3 further 5 mm trocars in the epigastrium and right upper quadrant without difficulty. His cholecystostomy was then removed. There was omentum and tract as well as the old cavity where his liver abscess had been. I used sharp dissection and removed this from the ribs. The gallbladder was then retracted cephalad. It was difficult to identify. There was omentum as well as duodenum adherent. Eventually I was able to free the gallbladder. Due to the fact I could  not find any structures in the triangle I decided to do this dome down. I used cautery and took the gallbladder down to where I thought it was safe. Eventually I then was able to dissect in the triangle and identified both the cystic duct and the cystic artery clearly. These were then clipped and divided. The gallbladder was then placed in a bag and removed through the umbilicus. Hemostasis was then obtained. Irrigation was performed. Surgicel snow was placed in the liver bed.I then removed all trocars and desufflated the abdomen. The umbilical stitch was tied down and completely obliterated the defect. I then closed these with 4 Monocryl and Dermabond. He tolerated this well was extubated and transferred to recovery stable.

## 2013-12-22 NOTE — H&P (View-Only) (Signed)
Patient ID: [name], male   DOB: 1946-01-15, 68 y.o.   MRN: [id number]  Chief Complaint  Patient presents with  . gallbladder drains    HPI Spencer Shelton. is a 68 y.o. male.   HPI Spencer Shelton who was recently admitted to [facility] for cholecystitis that had been present for a long time.  He ended up having his gb and an abscess drained. The abscess drain has been removed and the gb drain is not really draining much anymore. He feels much better.  He has no fevers, he is eating pretty well at this point.  He has no real complaints today and comes in for follow up.  Past Medical History  Diagnosis Date  . Anxiety   . Complete heart block   . Ventricular tachycardia   . Cardiomyopathy   . Atrial fibrillation   . Kidney stones     "once; passed on it's own" (10-15-2013)  . Hypertension   . High cholesterol   . CHF (congestive heart failure)   . Coronary artery disease     with known occlusions of all of his native coronary  . Anterior myocardial infarction 1982; 1995  . Asthma     "when I was a boy"  . Pneumonia     "couple times" (10-15-13)  . Type II diabetes mellitus   . Cholecystitis   . Automatic implantable cardioverter-defibrillator in situ     BI VENTRICULAR    Past Surgical History  Procedure Laterality Date  . Bi-ventricular implantable cardioverter defibrillator  (crt-d)  02/2008; 15-Oct-2013    MDT CRTD implanted by Dr Ladona Ridgel for primary prevention and CHF 2009 with 6949 lead; 09/2013 - gen change and RV lead revision by Dr Ladona Ridgel  . Cataract extraction w/ intraocular lens  implant, bilateral Bilateral ?2007  . Cardiac catheterization      "several"  . Coronary artery bypass graft  1982/1996    "CABG X5; CABG X4"     History reviewed. No pertinent family history.  Social History History  Substance Use Topics  . Smoking status: Former Smoker -- 4.00 packs/day for 12 years    Types: Cigarettes  . Smokeless tobacco: Never Used     Comment: "quit  smoking cigarettes in 1975  . Alcohol Use: No    Allergies  Allergen Reactions  . Statins Other (See Comments)    "hurt all over"  . Fenofibrate Rash  . Penicillins Rash  . Sulfonamide Derivatives Rash    Current Outpatient Prescriptions  Medication Sig Dispense Refill  . ALPHAGAN P 0.1 % SOLN Place 1 drop into both eyes 2 (two) times daily.       [name] amoxicillin-clavulanate (AUGMENTIN) 875-125 MG per tablet Take 1 tablet by mouth 2 (two) times daily.  28 tablet  0  . benazepril (LOTENSIN) 40 MG tablet Take 20 mg by mouth 2 (two) times daily.       . Coenzyme Q10 (COQ10) 100 MG CAPS Take 1 capsule by mouth daily.      . Cyanocobalamin (B-12 DOTS) 500 MCG TBDP Take 500 mg by mouth daily.       [name] ezetimibe (ZETIA) 10 MG tablet Take 10 mg by mouth daily.        . furosemide (LASIX) 40 MG tablet Take 40 mg by mouth 2 (two) times daily.       . metoprolol (LOPRESSOR) 50 MG tablet Take 50 mg by mouth 2 (two) times daily.       [name]  Multiple Vitamins-Minerals (MULTIVITAMIN WITH MINERALS) tablet Take 1 tablet by mouth daily.        . pioglitazone (ACTOS) 15 MG tablet Take 15 mg by mouth daily.      . TRAVATAN Z 0.004 % SOLN ophthalmic solution Place 1 drop into both eyes at bedtime.       . ursodiol (ACTIGALL) 300 MG capsule Take 300 mg by mouth 2 (two) times daily.        . valsartan (DIOVAN) 160 MG tablet Take 80 mg by mouth 2 (two) times daily.       [name] warfarin (COUMADIN) 5 MG tablet Take 5 mg by mouth daily.       No current facility-administered medications for this visit.    Review of Systems Review of Systems  Constitutional: Positive for unexpected weight change. Negative for fever and chills.  HENT: Negative for congestion, hearing loss, sore throat, trouble swallowing and voice change.   Eyes: Negative for visual disturbance.  Respiratory: Negative for cough and wheezing.   Cardiovascular: Negative for chest pain, palpitations and leg swelling.  Gastrointestinal: Negative for  nausea, vomiting, abdominal pain, diarrhea, constipation, blood in stool, abdominal distention, anal bleeding and rectal pain.  Genitourinary: Negative for hematuria and difficulty urinating.  Musculoskeletal: Negative for arthralgias.  Skin: Negative for rash and wound.  Neurological: Negative for seizures, syncope, weakness and headaches.  Hematological: Negative for adenopathy. Does not bruise/bleed easily.  Psychiatric/Behavioral: Negative for confusion.    Blood pressure 118/75, pulse 77, temperature 97.6 F (36.4 C), resp. rate 14, height 6\' 4"  (1.93 m), weight 182 lb (82.555 kg).  Physical Exam Physical Exam  Vitals reviewed. Constitutional: He appears well-developed and well-nourished.  Eyes: No scleral icterus.  Cardiovascular: Normal rate, regular rhythm and normal heart sounds.   Pulmonary/Chest: Effort normal and breath sounds normal. He has no wheezes. He has no rales.    Abdominal: Soft. Bowel sounds are normal. He exhibits no distension. There is no tenderness. No hernia.    Data Reviewed EXAM:  CT ABDOMEN AND PELVIS WITH CONTRAST  TECHNIQUE:  Multidetector CT imaging of the abdomen and pelvis was performed  using the standard protocol following bolus administration of  intravenous contrast.  CONTRAST: OMNIPAQUE IOHEXOL 300 MG/ML SOLN  COMPARISON: 10/25/2013  FINDINGS:  Small right pleural effusion is identified. Atelectasis is noted in  the posterior right lung base. Rounded atelectasis is noted in the  left posterior lung base. There is calcifications within the left  pleura.  The liver has a slightly nodular contour or suggestive of cirrhosis.  There is a percutaneous drainage catheter along the undersurface of  the left hepatic lobe. Interval resolution of subhepatic abscess.  Again noted is a percutaneous, trans a pathic cholecystostomy tube.  The tube is in satisfactory position within the gallbladder.  Multiple gallstones are again identified.  The volume of the  gallbladder appears slightly decreased from previous exam.  No biliary dilatation. The pancreas appears normal. The spleen is on  unremarkable.  The adrenal glands both appear normal. The right kidney is normal.  There is a cyst within the inferior pole the left kidney which  measures 2.6 cm, image 42/series 201. The urinary bladder appears  normal. Prostate gland and seminal vesicles are on unremarkable. See  a small amount of ascites is identified within the abdomen and  pelvis.  Calcified atherosclerotic disease involves the abdominal aorta. No  aneurysm. There is no upper abdominal adenopathy. No pelvic or  inguinal adenopathy.  The stomach is normal. The small bowel loops are on unremarkable.  Normal appearance of the colon.  Review of the visualized osseous structures is significant for  lumbar degenerative disc disease. This is most advanced at L5-S1. A  first degree anterolisthesis of L5 on S1 is noted. There are  bilateral L5 pars defects.  IMPRESSION:  1. There has been interval resolution of the left hepatic lobe  subhepatic abscess.  2. Satisfactory position of percutaneous cholecystostomy tube.  Gallstones are again noted within the lumen of the gallbladder.  3. Morphologic features of the liver compatible with cirrhosis.  4. Atherosclerotic disease.  5. Lumbar spondylosis.   Assessment    Cholecystitis     Plan    Laparoscopic, possible open, cholecystectomy with cholangiogram  I discussed the procedure in detail.  I told him somewhere around six weeks after drain would be time for surgery. I told him there is increased risk in range of 20% of open procedure. We discussed the risks and benefits of a laparoscopic cholecystectomy and possible cholangiogram including, but not limited to bleeding, infection, injury to surrounding structures such as the intestine or liver, bile leak, retained gallstones, need to convert to an open procedure, prolonged  diarrhea, blood clots such as  DVT, common bile duct injury, anesthesia risks, and possible need for additional procedures.  The likelihood of improvement in symptoms and return to the patient's normal status is good. We discussed the typical post-operative recovery course. I will see him before his surgery again and will obtain recs from Dr Donnie Ahoilley also.         Emelia LoronMatthew Lady Wisham 11/23/2013, 3:09 PM

## 2013-12-23 ENCOUNTER — Encounter (HOSPITAL_COMMUNITY): Payer: Self-pay | Admitting: *Deleted

## 2013-12-23 ENCOUNTER — Inpatient Hospital Stay (HOSPITAL_COMMUNITY): Payer: Medicare Other

## 2013-12-23 DIAGNOSIS — K810 Acute cholecystitis: Secondary | ICD-10-CM | POA: Diagnosis not present

## 2013-12-23 DIAGNOSIS — I5022 Chronic systolic (congestive) heart failure: Secondary | ICD-10-CM | POA: Diagnosis not present

## 2013-12-23 DIAGNOSIS — I428 Other cardiomyopathies: Secondary | ICD-10-CM | POA: Diagnosis not present

## 2013-12-23 DIAGNOSIS — I509 Heart failure, unspecified: Secondary | ICD-10-CM | POA: Diagnosis not present

## 2013-12-23 LAB — PROTIME-INR
INR: 1.32 (ref 0.00–1.49)
Prothrombin Time: 16.4 seconds — ABNORMAL HIGH (ref 11.6–15.2)

## 2013-12-23 LAB — BASIC METABOLIC PANEL
Anion gap: 9 (ref 5–15)
BUN: 11 mg/dL (ref 6–23)
CO2: 28 mEq/L (ref 19–32)
Calcium: 9.2 mg/dL (ref 8.4–10.5)
Chloride: 101 mEq/L (ref 96–112)
Creatinine, Ser: 0.98 mg/dL (ref 0.50–1.35)
GFR calc Af Amer: >90 mL/min (ref >90)
GFR calc non Af Amer: 83 mL/min — ABNORMAL LOW (ref >90)
GLUCOSE: 113 mg/dL — AB (ref 70–99)
Potassium: 5 mEq/L (ref 3.7–5.3)
Sodium: 138 mEq/L (ref 137–147)

## 2013-12-23 LAB — CBC
HCT: 36.8 % — ABNORMAL LOW (ref 39.0–52.0)
HEMOGLOBIN: 11.8 g/dL — AB (ref 13.0–17.0)
MCH: 28.8 pg (ref 26.0–34.0)
MCHC: 32.1 g/dL (ref 30.0–36.0)
MCV: 89.8 fL (ref 78.0–100.0)
PLATELETS: 157 10*3/uL (ref 150–400)
RBC: 4.1 MIL/uL — ABNORMAL LOW (ref 4.22–5.81)
RDW: 15.3 % (ref 11.5–15.5)
WBC: 12.2 10*3/uL — ABNORMAL HIGH (ref 4.0–10.5)

## 2013-12-23 MED ORDER — WARFARIN SODIUM 5 MG PO TABS
5.0000 mg | ORAL_TABLET | Freq: Every day | ORAL | Status: DC
  Filled 2013-12-23: qty 1

## 2013-12-23 MED ORDER — WARFARIN - PHYSICIAN DOSING INPATIENT: Freq: Every day | Status: DC

## 2013-12-23 MED ORDER — POLYETHYLENE GLYCOL 3350 17 G PO PACK
17.0000 g | PACK | Freq: Every day | ORAL | Status: DC
  Administered 2013-12-23 – 2013-12-24 (×2): 17 g via ORAL
  Filled 2013-12-23 (×2): qty 1

## 2013-12-23 MED ORDER — OXYCODONE HCL 5 MG PO TABS: 5.0000 mg | ORAL_TABLET | ORAL | Status: DC | PRN

## 2013-12-23 MED ORDER — SODIUM CHLORIDE 0.9 % IV SOLN
INTRAVENOUS | Status: DC
  Administered 2013-12-23: 11:00:00 via INTRAVENOUS

## 2013-12-23 MED ORDER — PANTOPRAZOLE SODIUM 40 MG PO TBEC
40.0000 mg | DELAYED_RELEASE_TABLET | Freq: Every day | ORAL | Status: DC
  Administered 2013-12-23: 40 mg via ORAL
  Filled 2013-12-23: qty 1

## 2013-12-23 NOTE — Progress Notes (Signed)
Patient ID: Spencer Shelton, male   DOB: 26-Feb-1946, 68 y.o.   MRN: [id number] Acute bulge in his periumbilical region, I think this is a hematoma or swelling but got a ct to make sure he didn't have hernia there which is negative.  He got coumadin last night which i didn't want him to get.  He is having some pvcs that are better now that he was given his cardiac meds.  Will plan on having him stay overnight for monitoring then home.

## 2013-12-23 NOTE — Progress Notes (Signed)
Subjective:  Asked by floor nurse to assess patient  b/o abnormal telemetry alarms. Tolerated surgery Ok, but c/o bulge when stands up in periumblical area.  No c/o chest  Pain or SOB. Objective:  Vital Signs in the last 24 hours: BP 123/73  Pulse 93  Temp(Src) 97.9 F (36.6 C) (Oral)  Resp 15  Ht 6\' 4"  (1.93 m)  Wt 87.181 kg (192 lb 3.2 oz)  BMI 23.40 kg/m2  SpO2 98%  Physical Exam: Pleasant WM in NAD Lungs:  Clear  Cardiac:  Regular rhythm, normal S1 and S2, no S3, no murmur Abdomen:  Soft, nontender, soft bulge in periumbilical area Extremities:  No edema present  Intake/Output from previous day: 07/08 0701 - 07/09 0700 In: 2501.7 [I.V.:2101.7; IV Piggyback:400] Out: 50 [Blood:50] Weight Filed Weights   12/22/13 0814 12/23/13 0511  Weight: 82.555 kg (182 lb) 87.181 kg (192 lb 3.2 oz)    Lab Results: Basic Metabolic Panel:  Recent Labs  83/41/96 1319 12/23/13 0326  NA 143 138  K 4.0 5.0  CL 102 101  CO2 29 28  GLUCOSE 104* 113*  BUN 11 11  CREATININE 1.07 0.98    CBC:  Recent Labs  12/21/13 1319 12/23/13 0326  WBC 9.4 12.2*  NEUTROABS 5.9  --   HGB 12.2* 11.8*  HCT 38.4* 36.8*  MCV 89.3 89.8  PLT 179 157   PROTIME: Lab Results  Component Value Date   INR 1.32 12/23/2013   INR 1.29 12/21/2013   INR 1.93* 10/31/2013    Telemetry:  bivent paced rhythm with occasional PVC's  Assessment/Plan:  1. Suspect lack of his usual meds led to increased PVC's today.  He has a biventricular defibrillator and paces all the time.  His underlying rhythm is atrial fibrillation chronically.   2. CAD stable 3. Chronic systolic CHF  Rec:  Restart his home meds.  His weight  Up 10 pounds and may need a little diuresis. Will ask rep to reinterrogate the defibrillator      W. Ashley Royalty  MD [facility]  12/23/2013, 1:40 PM

## 2013-12-23 NOTE — Progress Notes (Signed)
1 Day Post-Op  Subjective: No complaints, tol clear, voiding oob  Objective: Vital signs in last 24 hours: Temp:  [97.4 F (36.3 C)-97.9 F (36.6 C)] 97.9 F (36.6 C) (07/09 0615) Pulse Rate:  [69-73] 70 (07/09 0615) Resp:  [14-24] 15 (07/09 0615) BP: (99-127)/(52-64) 99/64 mmHg (07/09 0615) SpO2:  [98 %-100 %] 98 % (07/09 0615) Arterial Line BP: (116-135)/(38-52) 126/42 mmHg (07/08 1415) Weight:  [182 lb (82.555 kg)-192 lb 3.2 oz (87.181 kg)] 192 lb 3.2 oz (87.181 kg) (07/09 0511)    Intake/Output from previous day: 07/08 0701 - 07/09 0700 In: 2501.7 [I.V.:2101.7; IV Piggyback:400] Out: 50 [Blood:50] Intake/Output this shift:    General appearance: no distress Resp: clear to auscultation bilaterally Cardio: regular rate and rhythm GI: soft approp tender bs present incisions clean  Lab Results:   Recent Labs  12/21/13 1319 12/23/13 0326  WBC 9.4 12.2*  HGB 12.2* 11.8*  HCT 38.4* 36.8*  PLT 179 157   BMET  Recent Labs  12/21/13 1319 12/23/13 0326  NA 143 138  K 4.0 5.0  CL 102 101  CO2 29 28  GLUCOSE 104* 113*  BUN 11 11  CREATININE 1.07 0.98  CALCIUM 10.1 9.2   PT/INR  Recent Labs  12/21/13 1319 12/23/13 0326  LABPROT 16.1* 16.4*  INR 1.29 1.32   ABG No results found for this basename: PHART, PCO2, PO2, HCO3,  in the last 72 hours  Studies/Results: Dg Chest 2 View  12/21/2013   CLINICAL DATA:  For laparoscopic cholecystectomy, history of tachycardia and cardiomyopathy, former smoking history  EXAM: CHEST  2 VIEW  COMPARISON:  Chest x-ray of 10/26/2013  FINDINGS: Pleural parenchymal scarring at the left lung base appears stable with blunting of the left costophrenic angle and thickened pleura. No infiltrate or effusion is seen. Cardiomegaly is stable. AICD leads remain. No acute bony abnormality is seen. A pigtail drainage catheter is noted in the right upper quadrant.  IMPRESSION: 1. No active process. 2. Stable pleuro parenchymal scarring at the  left lung base. 3. Stable cardiomegaly with AICD leads.   Electronically Signed   By: Dwyane Dee M.D.   On: 12/21/2013 13:57    Anti-infectives: Anti-infectives   Start     Dose/Rate Route Frequency Ordered Stop   12/22/13 1800  ciprofloxacin (CIPRO) IVPB 400 mg     400 mg 200 mL/hr over 60 Minutes Intravenous Every 12 hours 12/22/13 1524     12/22/13 1700  metroNIDAZOLE (FLAGYL) IVPB 500 mg     500 mg 100 mL/hr over 60 Minutes Intravenous Every 8 hours 12/22/13 1524     12/22/13 0600  ciprofloxacin (CIPRO) IVPB 400 mg     400 mg 200 mL/hr over 60 Minutes Intravenous On call to O.R. 12/21/13 1411 12/22/13 1045      Assessment/Plan: POD 1 lap chole  Po pain meds Advance regular diet Coumadin restarted last night although I had written to start tonight If does well and ambulates can dc home later today with inr check on monday  [facility] 12/23/2013

## 2013-12-23 NOTE — Progress Notes (Signed)
Patient tele alarms was going off periodically. Patient assessed by myself, charge nurse, and MD notified. Rapid response team notified and came to assess patient. EKG done on patient. Also noted large bulge area in patient mid abdomen underneath umbilical cord. Assessed patient and MD notified. MD Emina followed up with patient.

## 2013-12-23 NOTE — Progress Notes (Signed)
   The patient noticed a protrusion.  No increase in pain, nausea or vomiting.  Passed flatus last night.  Tolerating orals.   Started on coumadin last night, 5mg .  INR today is 1.3. Also having frequent PVCs, pacer spiking mid QRS.  Will obtain a 12 lead EKG.  Electrolytes today are stable.  He denies cp, sob, palpitations.  Dr. Lindie Spruce at bedside, will notify Dr. Dwain Sarna for further instructions.    Kenneth Cuaresma, ANP-BC

## 2013-12-24 ENCOUNTER — Encounter (HOSPITAL_COMMUNITY): Payer: Self-pay | Admitting: General Surgery

## 2013-12-24 LAB — BASIC METABOLIC PANEL
Anion gap: 13 (ref 5–15)
BUN: 12 mg/dL (ref 6–23)
CHLORIDE: 103 meq/L (ref 96–112)
CO2: 25 mEq/L (ref 19–32)
Calcium: 9 mg/dL (ref 8.4–10.5)
Creatinine, Ser: 0.98 mg/dL (ref 0.50–1.35)
GFR calc non Af Amer: 83 mL/min — ABNORMAL LOW (ref >90)
Glucose, Bld: 125 mg/dL — ABNORMAL HIGH (ref 70–99)
Potassium: 4 mEq/L (ref 3.7–5.3)
Sodium: 141 mEq/L (ref 137–147)

## 2013-12-24 LAB — PROTIME-INR
INR: 1.39 (ref 0.00–1.49)
Prothrombin Time: 17.1 seconds — ABNORMAL HIGH (ref 11.6–15.2)

## 2013-12-24 MED ORDER — POLYETHYLENE GLYCOL 3350 17 G PO PACK: 17.0000 g | PACK | Freq: Every day | ORAL | Status: DC

## 2013-12-24 NOTE — Progress Notes (Signed)
Discussed discharge summary with patient. Reviewed all medications with patient. Patient stated he did not need Rx that was written. Wife at bedside. Patient did not have any questions and is ready for discharge.

## 2013-12-24 NOTE — Progress Notes (Signed)
2 Days Post-Op  Subjective: He is well, tol diet, ambulating voiding  Objective: Vital signs in last 24 hours: Temp:  [97.7 F (36.5 C)-98 F (36.7 C)] 97.7 F (36.5 C) (07/10 0634) Pulse Rate:  [69-93] 86 (07/10 0634) Resp:  [16] 16 (07/10 0634) BP: (100-123)/(57-73) 110/57 mmHg (07/10 0634) SpO2:  [96 %-99 %] 96 % (07/10 0634) Last BM Date: 12/21/13  Intake/Output from previous day: 07/09 0701 - 07/10 0700 In: 100 [IV Piggyback:100] Out: -  Intake/Output this shift:    General appearance: no distress GI: soft approp tender mild hematoma at umbilicus incisions clean  Lab Results:   Recent Labs  12/21/13 1319 12/23/13 0326  WBC 9.4 12.2*  HGB 12.2* 11.8*  HCT 38.4* 36.8*  PLT 179 157   BMET  Recent Labs  12/23/13 0326 12/24/13 0146  NA 138 141  K 5.0 4.0  CL 101 103  CO2 28 25  GLUCOSE 113* 125*  BUN 11 12  CREATININE 0.98 0.98  CALCIUM 9.2 9.0   PT/INR  Recent Labs  12/23/13 0326 12/24/13 0146  LABPROT 16.4* 17.1*  INR 1.32 1.39   ABG No results found for this basename: PHART, PCO2, PO2, HCO3,  in the last 72 hours  Studies/Results: Ct Abdomen Pelvis Wo Contrast  12/23/2013   CLINICAL DATA:  Status post laparoscopic cholecystectomy 12/22/2013. The patient reports acute onset swelling about the umbilicus. Question wound dehiscence.  EXAM: CT ABDOMEN AND PELVIS WITHOUT CONTRAST  TECHNIQUE: Multidetector CT imaging of the abdomen and pelvis was performed following the standard protocol without IV contrast.  COMPARISON:  CT abdomen and pelvis 10/21/2013.  FINDINGS: Pleural calcifications in the left base are identified. There are very small bilateral pleural effusions and some basilar atelectasis.  There is subcutaneous edema in the anterior abdomen centered about the umbilicus. No wound dehiscence is identified. There is free intraperitoneal air consistent with laparoscopic surgery 1 day ago. A small volume of scattered abdominal and pelvic ascites is  seen which appears simple. No organized fluid collection is seen.  Cholecystectomy clips are noted. There is no focal fluid collection within the cholecystectomy bed. No hemorrhage is seen. The liver appears shrunken with a nodular border compatible with cirrhosis. The spleen, adrenal glands, pancreas and right kidney are unremarkable. Left renal cyst is identified. Extensive aortoiliac atherosclerosis is seen. Calcification in the right hemipelvis is unchanged. The stomach and small and large bowel are unremarkable. No lymphadenopathy is identified. 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 No lytic or sclerotic bony lesion is identified.  IMPRESSION: Subcutaneous edema centered about the umbilicus accounts for abdominal wall swelling. Cause for the edema is not identified. There is no wound dehiscence.  Small volume of abdominal and pelvic ascites appears simple and likely related to postoperative change and/or cirrhosis rather than biliary leak.  Appearance of the liver compatible with cirrhosis.   Electronically Signed   By: Drusilla Kannerhomas  Dalessio M.D.   On: 12/23/2013 12:11    Anti-infectives: Anti-infectives   Start     Dose/Rate Route Frequency Ordered Stop   12/22/13 1800  ciprofloxacin (CIPRO) IVPB 400 mg  Status:  Discontinued     400 mg 200 mL/hr over 60 Minutes Intravenous Every 12 hours 12/22/13 1524 12/23/13 1227   12/22/13 1700  metroNIDAZOLE (FLAGYL) IVPB 500 mg     500 mg 100 mL/hr over 60 Minutes Intravenous Every 8 hours 12/22/13 1524     12/22/13 0600  ciprofloxacin (CIPRO) IVPB 400 mg  400 mg 200 mL/hr over 60 Minutes Intravenous On call to O.R. 12/21/13 1411 12/22/13 1045      Assessment/Plan: POD 2 lap chole  Dc home today Doing fine on all cardiac meds Restart warfarin at home  [facility] 12/24/2013

## 2013-12-27 ENCOUNTER — Telehealth (INDEPENDENT_AMBULATORY_CARE_PROVIDER_SITE_OTHER): Payer: Self-pay

## 2013-12-27 NOTE — Telephone Encounter (Signed)
Appt made for 7/23 arrive at 11:30.

## 2013-12-29 ENCOUNTER — Telehealth (INDEPENDENT_AMBULATORY_CARE_PROVIDER_SITE_OTHER): Payer: Self-pay

## 2013-12-29 NOTE — Telephone Encounter (Signed)
He should come to urg to get it looked at today

## 2013-12-29 NOTE — Telephone Encounter (Signed)
Pt is in [facility]. Does he need to go to the Urgent Care there? Pt has a f/u appt on 01/06/14.

## 2013-12-29 NOTE — Telephone Encounter (Addendum)
Pt s/p lap chole on 12/22/13 by Dr Dwain Sarna. Pt states that last night he noticed that his umbilical incision has started draining some clear drainage. Pt denies any redness, swelling or tenderness at the incision. Pt states that he has been wearing his binder regularly. Pt states that he was very active yesterday and was getting in and out of his vehicle. Advised pt that that he can place a guaze on the area if he needs to while he is wearing his binder. Encouraged pt to watch for increased drainage, if drainage becomes thicker or changes colors. Advised pt to also give [place] a call back if he notices any redness or increased swelling at the incision site. Pt verbalized understanding and agrees with POC.      Patient wife calling into office regarding patients drainage from his wound.  Wife states that when he's laying down there's no drainage but, once he sits up it heavy at times and saturates gauze fairly quickly.  Wife states drainage is clear, denies having any bloody drainage.  Patient called in this morning and spoke to [place], Charity fundraiser.  Message sent to Dr. Dwain Sarna and will await recommendations.

## 2013-12-29 NOTE — Telephone Encounter (Signed)
Called pt back to notify him that he really needs to be checked by [place] here at [facility]. I advised pt that he really needs to come back from [facility] to be seen in the urgent clinic tomorrow by Dr Michaell Cowing. The pt reports that his drainage is still clear but he has saturated a dressing pad and when he stood up the drainage was running down both sides of his legs. I advised pt that this is not normal so he really should be seen and the pt is agreement with this plan. The pt will do anything we advise b/c he is concerned as well with the drainage. I made him appt to see DR Gross for 7/16 1:30.  The pt just called me back to let me know that he is going to get his wife to drive him back now to [place] just in case if something happens to get worse this evening then he will be in town for him to go to the hospital here. I will notify Dr Dwain Sarna.

## 2013-12-29 NOTE — Discharge Summary (Signed)
Physician Discharge Summary  Patient ID: Spencer Shelton MRN: [id number] DOB/AGE: 1946/03/30 68 y.o.  Admit date: 12/22/2013 Discharge date: 12/29/2013  Admission Diagnoses: CAD History complicated cholecystitis s/p drain, liver abscess s/p drain  Discharge Diagnoses:  Same as above  Discharged Condition: good  Hospital Course: 56 yom who had complicated cholecystitis s/p drain of a liver abscess and percutaneous cholecystostomy.  He has done well and I returned him to the operating room and did an uneventful laparoscopic cholecystectomy.  He did well postoperatively. He is tolerating a regular diet, back on coumadin and will be discharged home  Consults: None  Significant Diagnostic Studies: none  Treatments: surgery: laparoscopic cholecystectomy   Disposition: 01-Home or Self Care     Medication List         ALPHAGAN P 0.1 % Soln  Generic drug:  brimonidine  Place 1 drop into both eyes 2 (two) times daily.     benazepril 40 MG tablet  Commonly known as:  LOTENSIN  Take 20 mg by mouth 2 (two) times daily.     CoQ10 100 MG Caps  Take 1 capsule by mouth daily.     ezetimibe 10 MG tablet  Commonly known as:  ZETIA  Take 10 mg by mouth daily.     furosemide 80 MG tablet  Commonly known as:  LASIX  Take 40 mg by mouth 2 (two) times daily.     metoprolol 50 MG tablet  Commonly known as:  LOPRESSOR  Take 50 mg by mouth 2 (two) times daily.     multivitamin with minerals tablet  Take 1 tablet by mouth daily.     oxyCODONE 5 MG immediate release tablet  Commonly known as:  Oxy IR/ROXICODONE  Take 1 tablet (5 mg total) by mouth every 4 (four) hours as needed for moderate pain.     polyethylene glycol packet  Commonly known as:  MIRALAX / GLYCOLAX  Take 17 g by mouth daily.     TRAVATAN Z 0.004 % Soln ophthalmic solution  Generic drug:  Travoprost (BAK Free)  Place 1 drop into both eyes at bedtime.     ursodiol 300 MG capsule  Commonly known as:  ACTIGALL  Take  300 mg by mouth 2 (two) times daily.     valsartan 160 MG tablet  Commonly known as:  DIOVAN  Take 80 mg by mouth 2 (two) times daily.     Vitamin B-12 2500 MCG Subl  Place 5,000 mcg under the tongue daily.     warfarin 5 MG tablet  Commonly known as:  COUMADIN  Take 5 mg by mouth daily.           Follow-up Information   Follow up with [facility], MD In 2 weeks.   Specialty:  General Surgery   Contact information:   [address] [place] [address] [phone]       Signed: Emelia Loron 12/29/2013, 8:36 AM

## 2013-12-30 ENCOUNTER — Ambulatory Visit (INDEPENDENT_AMBULATORY_CARE_PROVIDER_SITE_OTHER): Payer: Self-pay | Admitting: Surgery

## 2013-12-30 ENCOUNTER — Encounter (INDEPENDENT_AMBULATORY_CARE_PROVIDER_SITE_OTHER): Payer: Self-pay | Admitting: Surgery

## 2013-12-30 VITALS — BP 124/74 | HR 76 | Ht 76.0 in | Wt 187.0 lb

## 2013-12-30 DIAGNOSIS — K7460 Unspecified cirrhosis of liver: Secondary | ICD-10-CM

## 2013-12-30 DIAGNOSIS — R188 Other ascites: Secondary | ICD-10-CM

## 2013-12-30 DIAGNOSIS — K8000 Calculus of gallbladder with acute cholecystitis without obstruction: Secondary | ICD-10-CM

## 2013-12-30 NOTE — Progress Notes (Signed)
Subjective:     Patient ID: Spencer Doony R Rorrer Sr., male   DOB: 03/21/1946, 68 y.o.   MRN: [id number]  HPI  Note: Portions of this report may have been transcribed using voice recognition software. Every effort was made to ensure accuracy; however, inadvertent computerized transcription errors may be present.   Any transcriptional errors that result from this process are unintentional.       [name]  04/20/1946 [id number]  Patient Care Team: Pcp Not In System as PCP - General Othella BoyerWilliam S Tilley, MD as Consulting Physician (Cardiology) Emelia LoronMatthew Wakefield, MD as Consulting Physician (General Surgery) Florencia Reasonsobert Buccini V, MD as Consulting Physician (Gastroenterology)  Procedure (Date: 12/22/2013):  Preoperative diagnosis: Acute cholecystitis complicated by liver abscess s/p percutaneous drainage  Postoperative diagnosis: saa  Procedure: laparoscopic cholecystectomy  Surgeon: Dr Harden MoMatt Wakefield  Asst: Dr Almond LintFaera Byerly  EBL: 50 cc  Anethesia: general  Specimen: gb and contents to pathology   This patient returns for surgical re-evaluation.  He comes today with his wife.  [facility] [place] [facility].  Eating well.  No N/V.  Daily bowel movements.  No fevers or chills.  He does have cirrhosis but denies any major swelling or discomfort.  No jaundice.  Mental status sharp.  However, I noted some drainage from his bellybutton the care.  It became worse.  Then, down.  Then a whole bunch don been staying as close today.   They drove up and wished to be seen.  No fevers or chills.  Patient Active Problem List   Diagnosis Date Noted  . Calculus of gallbladder with acute cholecystitis s/p lap chole 12/22/2013 12/22/2013  . CAD (coronary artery disease), native coronary artery 10/26/2013  . Hyperlipidemia   . Cirrhosis   . Ventricular tachycardia (paroxysmal) 09/16/2013  . Biventricular implantable cardioverter-defibrillator in situ 04/16/2011  . Essential hypertension, benign  03/28/2010  . Atrial fibrillation 03/28/2010  . Chronic systolic heart failure 03/28/2010    Past Medical History  Diagnosis Date  . Anxiety   . Complete heart block   . Ventricular tachycardia   . Cardiomyopathy   . Atrial fibrillation   . Hypertension   . High cholesterol   . CHF (congestive heart failure)   . Coronary artery disease     with known occlusions of all of his native coronary  . Anterior myocardial infarction 1982; 1995  . Asthma     "when I was a boy"  . Cholecystitis   . Automatic implantable cardioverter-defibrillator in situ     BI VENTRICULAR  . Pneumonia     "couple times" (10/08/2013)?  [name]. Type II diabetes mellitus     no rx  . History of kidney stones   . Anemia     ? per dr Kearney Hardwake[place] nurs    Past Surgical History  Procedure Laterality Date  . Bi-ventricular implantable cardioverter defibrillator  (crt-d)  02/2008; 10/08/2013    MDT CRTD implanted by Dr Ladona Ridgelaylor for primary prevention and CHF 2009 with 6949 lead; 09/2013 - gen change and RV lead revision by Dr Ladona Ridgelaylor  . Cataract extraction w/ intraocular lens  implant, bilateral Bilateral ?2007  . Cardiac catheterization      "several"  . 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 Gallbladder drain  5/15  . Cholecystectomy N/A 12/22/2013    Procedure: LAPAROSCOPIC CHOLECYSTECTOMY, POSSIBLE OPEN;  Surgeon: Emelia LoronMatthew Wakefield, MD;  Location: [facility] OR;  Service: General;  Laterality: N/A;    History   Social History  . Marital Status: Married    Spouse Name: N/A    Number of Children: N/A  . Years of Education: N/A   Occupational History  . Retired    Social History Main Topics  . Smoking status: Former Smoker -- 3.00 packs/day for 15 years    Types: Cigarettes    Quit date: 12/21/1973  . Smokeless tobacco: Never Used     Comment: "quit smoking cigarettes in 1975  . Alcohol Use: No  . Drug Use: No  . Sexual Activity: No   Other Topics Concern  . Not on file   Social  History Narrative  . No narrative on file    History reviewed. No pertinent family history.  Current Outpatient Prescriptions  Medication Sig Dispense Refill  . ALPHAGAN P 0.1 % SOLN Place 1 drop into both eyes 2 (two) times daily.       . benazepril (LOTENSIN) 40 MG tablet Take 20 mg by mouth 2 (two) times daily.       . Coenzyme Q10 (COQ10) 100 MG CAPS Take 1 capsule by mouth daily.      . Cyanocobalamin (VITAMIN B-12) 2500 MCG SUBL Place 5,000 mcg under the tongue daily.      [name] ezetimibe (ZETIA) 10 MG tablet Take 10 mg by mouth daily.        . furosemide (LASIX) 80 MG tablet Take 40 mg by mouth 2 (two) times daily.      . metoprolol (LOPRESSOR) 50 MG tablet Take 50 mg by mouth 2 (two) times daily.       . Multiple Vitamins-Minerals (MULTIVITAMIN WITH MINERALS) tablet Take 1 tablet by mouth daily.        . polyethylene glycol (MIRALAX / GLYCOLAX) packet Take 17 g by mouth daily.  14 each  0  . TRAVATAN Z 0.004 % SOLN ophthalmic solution Place 1 drop into both eyes at bedtime.       . ursodiol (ACTIGALL) 300 MG capsule Take 300 mg by mouth 2 (two) times daily.       . valsartan (DIOVAN) 160 MG tablet Take 80 mg by mouth 2 (two) times daily.       [name] warfarin (COUMADIN) 5 MG tablet Take 5 mg by mouth daily.       No current facility-administered medications for this visit.     Allergies  Allergen Reactions  . Statins Other (See Comments)    "hurt all over"  . Fenofibrate Rash  . Penicillins Rash  . Sulfonamide Derivatives Rash    BP 124/74  Pulse 76  Ht 6\' 4"  (1.93 m)  Wt 187 lb (84.823 kg)  BMI 22.77 kg/m2  Ct Abdomen Pelvis Wo Contrast  12/23/2013   CLINICAL DATA:  Status post laparoscopic cholecystectomy 12/22/2013. The patient reports acute onset swelling about the umbilicus. Question wound dehiscence.  EXAM: CT ABDOMEN AND PELVIS WITHOUT CONTRAST  TECHNIQUE: Multidetector CT imaging of the abdomen and pelvis was performed following the standard protocol without IV  contrast.  COMPARISON:  CT abdomen and pelvis 10/21/2013.  FINDINGS: Pleural calcifications in the left base are identified. There are very small bilateral pleural effusions and some basilar atelectasis.  There is subcutaneous edema in the anterior abdomen centered about the umbilicus. No wound dehiscence is identified. There is free intraperitoneal air consistent with laparoscopic surgery 1 day ago. A small volume of scattered abdominal and pelvic ascites is seen which  appears simple. No organized fluid collection is seen.  Cholecystectomy clips are noted. There is no focal fluid collection within the cholecystectomy bed. No hemorrhage is seen. The liver appears shrunken with a nodular border compatible with cirrhosis. The spleen, adrenal glands, pancreas and right kidney are unremarkable. Left renal cyst is identified. Extensive aortoiliac atherosclerosis is seen. Calcification in the right hemipelvis is unchanged. The stomach and small and large bowel are unremarkable. No lymphadenopathy is identified. 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 No lytic or sclerotic bony lesion is identified.  IMPRESSION: Subcutaneous edema centered about the umbilicus accounts for abdominal wall swelling. Cause for the edema is not identified. There is no wound dehiscence.  Small volume of abdominal and pelvic ascites appears simple and likely related to postoperative change and/or cirrhosis rather than biliary leak.  Appearance of the liver compatible with cirrhosis.   Electronically Signed   By: Drusilla Kanner M.D.   On: 12/23/2013 12:11   Dg Chest 2 View  12/21/2013   CLINICAL DATA:  For laparoscopic cholecystectomy, history of tachycardia and cardiomyopathy, former smoking history  EXAM: CHEST  2 VIEW  COMPARISON:  Chest x-ray of 10/26/2013  FINDINGS: Pleural parenchymal scarring at the left lung base appears stable with blunting of the left costophrenic angle and  thickened pleura. No infiltrate or effusion is seen. Cardiomegaly is stable. AICD leads remain. No acute bony abnormality is seen. A pigtail drainage catheter is noted in the right upper quadrant.  IMPRESSION: 1. No active process. 2. Stable pleuro parenchymal scarring at the left lung base. 3. Stable cardiomegaly with AICD leads.   Electronically Signed   By: Dwyane Dee M.D.   On: 12/21/2013 13:57    Review of Systems  Constitutional: Negative for fever, chills and diaphoresis.  HENT: Negative for sore throat and trouble swallowing.   Eyes: Negative for photophobia and visual disturbance.  Respiratory: Negative for choking and shortness of breath.   Cardiovascular: Negative for chest pain and palpitations.  Gastrointestinal: Negative for nausea, vomiting, abdominal distention, anal bleeding and rectal pain.  Genitourinary: Negative for dysuria, urgency, difficulty urinating and testicular pain.  Musculoskeletal: Negative for arthralgias, gait problem, myalgias and neck pain.  Skin: Negative for color change and rash.  Neurological: Negative for dizziness, speech difficulty, weakness and numbness.  Hematological: Negative for adenopathy.  Psychiatric/Behavioral: Negative for hallucinations, confusion and agitation.       Objective:   Physical Exam  Constitutional: He is oriented to person, place, and time. He appears well-developed and well-nourished. No distress.  HENT:  Head: Normocephalic.  Mouth/Throat: Oropharynx is clear and moist. No oropharyngeal exudate.  Eyes: Conjunctivae and EOM are normal. Pupils are equal, round, and reactive to light. No scleral icterus.  Neck: Normal range of motion. No tracheal deviation present.  Cardiovascular: Normal rate, normal heart sounds and intact distal pulses.   Pulmonary/Chest: Effort normal. No respiratory distress.  Abdominal: Soft. He exhibits no distension. There is no tenderness. A hernia is present. Hernia confirmed positive in the  right inguinal area and confirmed positive in the left inguinal area.    Incisions clean with normal healing ridges.  No hernias  Genitourinary:     Musculoskeletal: Normal range of motion. He exhibits no tenderness.  Neurological: He is alert and oriented to person, place, and time. No cranial nerve deficit. He exhibits normal muscle tone. Coordination normal.  Skin: Skin is warm and dry. No rash noted. He is not diaphoretic.  Psychiatric: He  has a normal mood and affect. His behavior is normal.       Assessment:     Spontaneous leaking of ascites through umbilical incision.  No evidence of cellulitis.  Otherwise doing well.     Plan:     I went ahead and sterilely stitched the umb incision with a 0 silk horizontal mattress suture.  That provided a watertight seal.  He is otherwise doing great.  Increase activity as tolerated to regular activity.  Low impact exercise such as walking an hour a day at least ideal.  Do not push through pain.  Diet as tolerated.  Low fat high fiber diet ideal.  Bowel regimen with 30 g fiber a day and fiber supplement as needed to avoid problems.  He does have right greater than left inguinal hernias.  The right is getting more sensitive.  May benefit from repair but obviously perioperative risk higher given his cardiac and hepatic issues.  Defer to Dr. Dwain Sarna.  Return to clinic later this month, sooner as needed.   Instructions discussed.  Followup with primary care physician for other health issues as would normally be done.  Consider screening for malignancies (breast, prostate, colon, melanoma, etc) as appropriate.  Questions answered.  The patient expressed understanding and appreciation

## 2013-12-30 NOTE — Patient Instructions (Signed)
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  The black silk suture provided a watertight seal.  If you have repeated leaking, you will need to be resutured & reevaluated.  Keep appointment to see Dr. Dwain SarnaWakefield later this month.  At that point suture probably can be removed.  See the Handout(s) we have given you.  Please call our office at [phone] if you wish to schedule surgery or if you have further questions / concerns.    LAPAROSCOPIC SURGERY: POST OP INSTRUCTIONS  1. DIET: Follow a light bland diet the first 24 hours after arrival home, such as soup, liquids, crackers, etc.  Be sure to include lots of fluids daily.  Avoid fast food or heavy meals as your are more likely to get nauseated.  Eat a low fat the next few days after surgery.   2. Take your usually prescribed home medications unless otherwise directed. 3. PAIN CONTROL: a. Pain is best controlled by a usual combination of three different methods TOGETHER: i. Ice/Heat ii. Over the counter pain medication iii. Prescription pain medication b. Most patients will experience some swelling and bruising around the incisions.  Ice packs or heating pads (30-60 minutes up to 6 times a day) will help. Use ice for the first few days to help decrease swelling and bruising, then switch to heat to help relax tight/sore spots and speed recovery.  Some people prefer to use ice alone, heat alone, alternating between ice & heat.  Experiment to what works for you.  Swelling and bruising can take several weeks to resolve.   c. It is helpful to take an over-the-counter pain medication regularly for the first few weeks.  Choose one of the following that works best for you: i. Acetaminophen (Tylenol, etc) 500 four times a day (every meal & bedtime) d. A  prescription for pain medication (such as oxycodone, hydrocodone, etc) should be given to you  upon discharge.  Take your pain medication as prescribed.  i. If you are having problems/concerns with the prescription medicine (does not control pain, nausea, vomiting, rash, itching, etc), please call us [phone]) [phone] to see if we need to switch you to a different pain medicine that will work better for you and/or control your side effect better. ii. If you need a refill on your pain medication, please contact your pharmacy.  They will contact our office to request authorization. Prescriptions will not be filled after 5 pm or on week-ends. 4. Avoid getting constipated.  Between the surgery and the pain medications, it is common to experience some constipation.  Increasing fluid intake and taking a fiber supplement (such as Metamucil, Citrucel, FiberCon, MiraLax, etc) 1-2 times a day regularly will usually help prevent this problem from occurring.  A mild laxative (prune juice, Milk of Magnesia, MiraLax, etc) should be taken according to package directions if there are no bowel movements after 48 hours.   5. Watch out for diarrhea.  If you have many loose bowel movements, simplify your diet to bland foods & liquids for a few days.  Stop any stool softeners and decrease your fiber supplement.  Switching to mild anti-diarrheal medications (Kayopectate, Pepto Bismol) can help.  If this worsens or does not improve, please call us. 6. Wash / shower every day.  You may shower over the dressings as they are waterproof.  Continue to shower over incision(s) after the dressing is  off. 7. Remove your waterproof bandages 5 days after surgery.  You may leave the incision open to air.  You may replace a dressing/Band-Aid to cover the incision for comfort if you wish.  8. ACTIVITIES as tolerated:   a. You may resume regular (light) daily activities beginning the next day-such as daily self-care, walking, climbing stairs-gradually increasing activities as tolerated.  If you can walk 30 minutes without difficulty, it is  safe to try more intense activity such as jogging, treadmill, bicycling, low-impact aerobics, swimming, etc. b. Save the most intensive and strenuous activity for last such as sit-ups, heavy lifting, contact sports, etc  Refrain from any heavy lifting or straining until you are off narcotics for pain control.   c. DO NOT PUSH THROUGH PAIN.  Let pain be your guide: If it hurts to do something, don't do it.  Pain is your body warning you to avoid that activity for another week until the pain goes down. d. You may drive when you are no longer taking prescription pain medication, you can comfortably wear a seatbelt, and you can safely maneuver your car and apply brakes. e. Bonita Quin may have sexual intercourse when it is comfortable.  9. FOLLOW UP in our office a. Please call [facility] at [phone] to set up an appointment to see your surgeon in the office for a follow-up appointment approximately 2-3 weeks after your surgery. b. Make sure that you call for this appointment the day you arrive home to insure a convenient appointment time. 10. IF YOU HAVE DISABILITY OR FAMILY LEAVE FORMS, BRING THEM TO THE OFFICE FOR PROCESSING.  DO NOT GIVE THEM TO YOUR DOCTOR.   WHEN TO CALL us [phone]: 1. Poor pain control 2. Reactions / problems with new medications (rash/itching, nausea, etc)  3. Fever over 101.5 F (38.5 C) 4. Inability to urinate 5. Nausea and/or vomiting 6. Worsening swelling or bruising 7. Continued bleeding from incision. 8. Increased pain, redness, or drainage from the incision   The clinic staff is available to answer your questions during regular business hours (8:30am-5pm).  Please don't hesitate to call and ask to speak to one of our nurses for clinical concerns.   If you have a medical emergency, go to the nearest emergency room or call 911.  A surgeon from [facility] is always on call at the [facility], [place] [address], [address], [place], [place]  [address] ? MAIN: [phone]) [phone] ? TOLL FREE: [phone] ?  FAX [phone] [email or web address]

## 2013-12-31 ENCOUNTER — Telehealth (INDEPENDENT_AMBULATORY_CARE_PROVIDER_SITE_OTHER): Payer: Self-pay

## 2013-12-31 NOTE — Telephone Encounter (Signed)
Called to check on the pt from yesterday's office visit with Dr Michaell Cowing and having to get some sutures placed at the belly button. The pt feels very sore today but has not had anymore fluid to drain out since her was seen in our office. I advised pt that I did speak to Dr Dwain Sarna and he really is concerned with the drainage b/c just not sure exactly what is going on with the fluid and why pt is having this fluid to drain like it is now. Dr Dwain Sarna would really rather the pt stay in [facility] until his appt next week with Dr Dwain Sarna on Thursday. I advised pt that if he starts having the drainage again I want him to call me. I want the pt to call if has any symptoms like fever,redness,chills,or more drainage. Pt understands.

## 2014-01-06 ENCOUNTER — Ambulatory Visit (INDEPENDENT_AMBULATORY_CARE_PROVIDER_SITE_OTHER): Payer: Medicare Other | Admitting: General Surgery

## 2014-01-06 ENCOUNTER — Encounter (INDEPENDENT_AMBULATORY_CARE_PROVIDER_SITE_OTHER): Payer: Self-pay | Admitting: General Surgery

## 2014-01-06 ENCOUNTER — Telehealth (INDEPENDENT_AMBULATORY_CARE_PROVIDER_SITE_OTHER): Payer: Self-pay

## 2014-01-06 VITALS — BP 118/64 | HR 68 | Temp 96.9°F | Resp 16 | Ht 76.0 in | Wt 180.6 lb

## 2014-01-06 DIAGNOSIS — Z09 Encounter for follow-up examination after completed treatment for conditions other than malignant neoplasm: Secondary | ICD-10-CM

## 2014-01-06 NOTE — Progress Notes (Signed)
Subjective:     Patient ID: [name], male   DOB: 12/30/1945, 68 y.o.   MRN: [id number]  HPI 62 yom s/p lap chole who is overall doing well. Tolerating diet and having bms.  He had leakage from incision and saw one of my partners who placed a stitch. He has done well since then. No fevers. No more drainage.  Review of Systems     Objective:   Physical Exam Healing incisions without infection, i removed umbilical stitch today and placed steristrips    Assessment:     S/p lap chole     Plan:     He will continue reduced activity. He can shower. Plan on seeing him back in 2 weeks or sooner if needed.

## 2014-01-06 NOTE — Telephone Encounter (Signed)
Patient called in explaining that by the time he had gotten home, he had a softball size amount of drainage present on his t-shirt.  He was very concerned with this and wanted to know if he needed to come back in for another stitch to be placed.  Informed him that after speaking with Elease Hashimoto and Dr. Dwain Sarna, they want him to keep this bandaged up over night and they want to watch the drainage for 24 hrs.  Informed him to keep the wound covered with clean gauze and medical tape.  Informed him that we want to make sure it isnt saturating through his bandages.  Pt understood instructions and explained he would do this.

## 2014-01-06 NOTE — Telephone Encounter (Signed)
Called pt back. Per Dr Dwain Sarna the pt needs to stay in town for 24hrs so we can just keep an eye out on the drainage. Pt advised to keep area covered with bandage and call me in the am. Dr Dwain Sarna advised if no more drainage than the [facility]. Pt understands.

## 2014-01-06 NOTE — Telephone Encounter (Signed)
Pt called in to report that he is having drainage again since he left the office appt today. The pt reports that the drainage on his shirt was the size of a grapefruit. The pt did not leave for [facility] yet since the drainage started. I will check with Dr Dwain Sarna and call pt back.

## 2014-01-07 NOTE — Telephone Encounter (Signed)
Pt calling to give update on the drainage. Pt is doing fine and he has not had anymore drainage since yesterday. I advised ok to go back to [facility]. I advised pt to keep me informed about the drainage. Pt is aware.

## 2014-01-20 ENCOUNTER — Encounter (INDEPENDENT_AMBULATORY_CARE_PROVIDER_SITE_OTHER): Payer: Self-pay | Admitting: General Surgery

## 2014-01-20 ENCOUNTER — Encounter: Payer: Self-pay | Admitting: Internal Medicine

## 2014-01-20 ENCOUNTER — Ambulatory Visit (INDEPENDENT_AMBULATORY_CARE_PROVIDER_SITE_OTHER): Payer: Medicare Other | Admitting: Internal Medicine

## 2014-01-20 ENCOUNTER — Ambulatory Visit (INDEPENDENT_AMBULATORY_CARE_PROVIDER_SITE_OTHER): Payer: Medicare Other | Admitting: General Surgery

## 2014-01-20 VITALS — BP 101/62 | HR 88 | Ht 76.0 in | Wt 190.2 lb

## 2014-01-20 VITALS — BP 122/74 | HR 90 | Temp 97.5°F | Ht 76.0 in | Wt 188.0 lb

## 2014-01-20 DIAGNOSIS — I4729 Other ventricular tachycardia: Secondary | ICD-10-CM

## 2014-01-20 DIAGNOSIS — Z09 Encounter for follow-up examination after completed treatment for conditions other than malignant neoplasm: Secondary | ICD-10-CM

## 2014-01-20 DIAGNOSIS — I472 Ventricular tachycardia: Secondary | ICD-10-CM

## 2014-01-20 DIAGNOSIS — I4891 Unspecified atrial fibrillation: Secondary | ICD-10-CM

## 2014-01-20 DIAGNOSIS — I480 Paroxysmal atrial fibrillation: Secondary | ICD-10-CM

## 2014-01-20 DIAGNOSIS — I5022 Chronic systolic (congestive) heart failure: Secondary | ICD-10-CM

## 2014-01-20 DIAGNOSIS — Z95810 Presence of automatic (implantable) cardiac defibrillator: Secondary | ICD-10-CM

## 2014-01-20 LAB — MDC_IDC_ENUM_SESS_TYPE_INCLINIC
Battery Voltage: 3.01 V
Brady Statistic AP VP Percent: 0 %
Brady Statistic AP VS Percent: 0 %
Brady Statistic AS VS Percent: 9.37 %
Brady Statistic RA Percent Paced: 0 %
Brady Statistic RV Percent Paced: 92.33 %
HighPow Impedance: 247 Ohm
HighPow Impedance: 61 Ohm
Lead Channel Impedance Value: 4047 Ohm
Lead Channel Impedance Value: 4047 Ohm
Lead Channel Impedance Value: 456 Ohm
Lead Channel Impedance Value: 589 Ohm
Lead Channel Pacing Threshold Amplitude: 0.5 V
Lead Channel Pacing Threshold Pulse Width: 0.4 ms
Lead Channel Setting Pacing Amplitude: 2 V
Lead Channel Setting Pacing Amplitude: 2.5 V
Lead Channel Setting Pacing Pulse Width: 0.4 ms
Lead Channel Setting Pacing Pulse Width: 0.4 ms
Lead Channel Setting Sensing Sensitivity: 0.3 mV
MDC IDC MSMT BATTERY REMAINING LONGEVITY: 99 mo
MDC IDC MSMT LEADCHNL LV PACING THRESHOLD AMPLITUDE: 0.5 V
MDC IDC MSMT LEADCHNL LV PACING THRESHOLD PULSEWIDTH: 0.4 ms
MDC IDC MSMT LEADCHNL RA IMPEDANCE VALUE: 418 Ohm
MDC IDC MSMT LEADCHNL RA SENSING INTR AMPL: 0.5 mV
MDC IDC MSMT LEADCHNL RV SENSING INTR AMPL: 9.125 mV
MDC IDC SESS DTM: 20150806113625
MDC IDC SET ZONE DETECTION INTERVAL: 320 ms
MDC IDC STAT BRADY AS VP PERCENT: 90.63 %
Zone Setting Detection Interval: 350 ms
Zone Setting Detection Interval: 370 ms
Zone Setting Detection Interval: 450 ms

## 2014-01-20 NOTE — Progress Notes (Signed)
HPI Spencer Shelton returns today for followup. He is a pleasant 68 yo man with chronic systolic heart failure, CHB, atrial fib, s/p BiV ICD implant. He has undergone generator replacement. He has a new ICD lead. He underwent GB surgery after a long 2 week episode of abdominal pain. He had a drain placed prior to undergoing cholecystectomy. His CHF has been well compensated. Allergies  Allergen Reactions  . Statins Other (See Comments)    "hurt all over"  . Fenofibrate Rash  . Penicillins Rash  . Sulfonamide Derivatives Rash     Current Outpatient Prescriptions  Medication Sig Dispense Refill  . ALPHAGAN P 0.1 % SOLN Place 1 drop into both eyes 2 (two) times daily.       . benazepril (LOTENSIN) 40 MG tablet Take 20 mg by mouth 2 (two) times daily.       . Coenzyme Q10 (COQ10) 100 MG CAPS Take 1 capsule by mouth daily.      . Cyanocobalamin (VITAMIN B-12) 2500 MCG SUBL Place 5,000 mcg under the tongue daily.      [name]. ezetimibe (ZETIA) 10 MG tablet Take 10 mg by mouth daily.        . furosemide (LASIX) 80 MG tablet Take 40 mg by mouth 2 (two) times daily.      . metoprolol (LOPRESSOR) 50 MG tablet Take 50 mg by mouth 2 (two) times daily.       . Multiple Vitamins-Minerals (MULTIVITAMIN WITH MINERALS) tablet Take 1 tablet by mouth daily.        . polyethylene glycol (MIRALAX / GLYCOLAX) packet Take 17 g by mouth daily.  14 each  0  . TRAVATAN Z 0.004 % SOLN ophthalmic solution Place 1 drop into both eyes at bedtime.       . valsartan (DIOVAN) 160 MG tablet Take 80 mg by mouth 2 (two) times daily.       [name]. warfarin (COUMADIN) 5 MG tablet Take 5 mg by mouth daily.       No current facility-administered medications for this visit.     Past Medical History  Diagnosis Date  . Anxiety   . Complete heart block   . Ventricular tachycardia   . Cardiomyopathy   . Atrial fibrillation   . Hypertension   . High cholesterol   . CHF (congestive heart failure)   . Coronary artery disease    with known occlusions of all of his native coronary  . Anterior myocardial infarction 1982; 1995  . Asthma     "when I was a boy"  . Cholecystitis   . Automatic implantable cardioverter-defibrillator in situ     BI VENTRICULAR  . Pneumonia     "couple times" (10/08/2013)?  [name]. Type II diabetes mellitus     no rx  . History of kidney stones   . Anemia     ? per dr Dwain Sarnawakefield nurs    ROS:   All systems reviewed and negative except as noted in the HPI.   Past Surgical History  Procedure Laterality Date  . Bi-ventricular implantable cardioverter defibrillator  (crt-d)  02/2008; 10/08/2013    MDT CRTD implanted by Dr Ladona Ridgelaylor for primary prevention and CHF 2009 with 6949 lead; 09/2013 - gen change and RV lead revision by Dr Ladona Ridgelaylor  . Cataract extraction w/ intraocular lens  implant, bilateral Bilateral ?2007  . Cardiac catheterization      "several"  . Coronary artery bypass graft  1982/1996    "  CABG X5; CABG X4"   . Gallbladder drain  5/15  . Cholecystectomy N/A 12/22/2013    Procedure: LAPAROSCOPIC CHOLECYSTECTOMY, POSSIBLE OPEN;  Surgeon: Emelia Loron, MD;  Location: [facility];  Service: General;  Laterality: N/A;     History reviewed. No pertinent family history.   History   Social History  . Marital Status: Married    Spouse Name: N/A    Number of Children: N/A  . Years of Education: N/A   Occupational History  . Retired    Social History Main Topics  . Smoking status: Former Smoker -- 3.00 packs/day for 15 years    Types: Cigarettes    Quit date: 12/21/1973  . Smokeless tobacco: Never Used     Comment: "quit smoking cigarettes in 1975  . Alcohol Use: No  . Drug Use: No  . Sexual Activity: No   Other Topics Concern  . Not on file   Social History Narrative  . No narrative on file     BP 101/62  Pulse 88  Ht 6\' 4"  (1.93 m)  Wt 190 lb 3.2 oz (86.274 kg)  BMI 23.16 kg/m2  Physical Exam:  Well appearing 67 yo man, NAD HEENT: Unremarkable Neck:  No JVD,  no thyromegally Back:  No CVA tenderness Lungs:  Clear with no wheezes HEART:  Regular rate rhythm, no murmurs, no rubs, no clicks Abd:  soft, positive bowel sounds, no organomegally, no rebound, no guarding Ext:  2 plus pulses, no edema, no cyanosis, no clubbing Skin:  No rashes no nodules Neuro:  CN II through XII intact, motor grossly intact  EKG - atrial fib with ventricular pacing  DEVICE  Normal device function.  See PaceArt for details.  Assess/Plan:

## 2014-01-20 NOTE — Patient Instructions (Signed)
Your physician wants you to follow-up in: 12 months with Dr. Taylor. You will receive a reminder letter in the mail two months in advance. If you don't receive a letter, please call our office to schedule the follow-up appointment.    

## 2014-01-20 NOTE — Progress Notes (Signed)
Subjective:     Patient ID: Spencer Shelton Sr., Spencer Shelton   DOB: [birth year], 68 y.o.   MRN: [id number]  HPI 68 yom s/p lap chole who is now doing well.  No more leakage from incision.  He is eating well, no n/v, having bms.  He is mostly back to his normal self now.  Review of Systems     Objective:   Physical Exam Healing incisions without infection, no hernia    Assessment:     S/p lap chole    Plan:     He can return to full activity and see me as needed.  He has longstanding asx hernia that he will let me know if this bothers him

## 2014-01-20 NOTE — Assessment & Plan Note (Signed)
His Medtronic BiV ICD is working normally. Will recheck in several months. 

## 2014-01-20 NOTE — Assessment & Plan Note (Signed)
His symptoms are class 2. No change in meds. He is encouraged to continue a low sodium diet.

## 2014-01-21 ENCOUNTER — Encounter (INDEPENDENT_AMBULATORY_CARE_PROVIDER_SITE_OTHER): Payer: Medicare Other | Admitting: General Surgery

## 2014-02-02 ENCOUNTER — Telehealth (INDEPENDENT_AMBULATORY_CARE_PROVIDER_SITE_OTHER): Payer: Self-pay

## 2014-02-02 DIAGNOSIS — R1900 Intra-abdominal and pelvic swelling, mass and lump, unspecified site: Secondary | ICD-10-CM

## 2014-02-02 NOTE — Telephone Encounter (Signed)
Pt s/p lap chole on 12/22/13 by Dr Dwain Sarna. Pt is calling today to let Dr Dwain Sarna know that his abdomen has been swelling. Pt states that he has been feeling good no other comments. Pt has not had any fevers, chills, n/v. Pt states that his bowels have been good and he has not had any issues eating and keeping foods down. Pt states that at night usually this goes down some and then it increases throughout the day. Pt states that he does have heart problems however the swelling has usually also been in his legs and feet. Pt wanted to know if Dr Dwain Sarna needed to see him or if he needs to go back and see Dr Matthias Hughs. Informed pt that I would send a message to Dr Dwain Sarna and his assistant and we will contact him as soon as we receive a response. Pt verbalized understanding and agrees with POC.

## 2014-02-06 NOTE — Telephone Encounter (Signed)
I dont think he has a hernia which would be major concern from last time I saw him.  I actually scanned him immediately postop because I was worried about some swelling. He may indeed have some ascites and seeing gi is probably next best step. If he is having a bulge just at incision then he needs to see me.

## 2014-02-07 NOTE — Telephone Encounter (Signed)
Returned pt's call. I advised him of Dr Doreen Salvage message below. The pt doses not have a bulge he just has abdominal swelling but no leakage yet. I advised pt that I was going to send him to his GI doctor which is Dr Matthias Hughs to evaluate. Pt asked for me to make it after Labor day and req. Pm appt b/c with him living at [facility]. I will call pt back with the appt time.

## 2014-02-07 NOTE — Addendum Note (Signed)
Addended by: Ethlyn Gallery on: 02/07/2014 09:40 AM   Modules accepted: Orders

## 2014-02-07 NOTE — Telephone Encounter (Signed)
Pt notified that we made an appt with Dr Doy Mince for 03/09/14 arrive at 11:30 for the abdominal swelling. Pt understands.

## 2014-05-02 ENCOUNTER — Other Ambulatory Visit: Payer: Self-pay | Admitting: Cardiology

## 2014-05-02 ENCOUNTER — Ambulatory Visit
Admission: RE | Admit: 2014-05-02 | Discharge: 2014-05-02 | Disposition: A | Discharge status: Home / Self Care | Payer: Medicare Other | Source: Ambulatory Visit | Attending: Cardiology | Admitting: Cardiology

## 2014-05-02 DIAGNOSIS — I5022 Chronic systolic (congestive) heart failure: Secondary | ICD-10-CM

## 2014-05-26 ENCOUNTER — Encounter (HOSPITAL_COMMUNITY): Payer: Self-pay | Admitting: Internal Medicine

## 2015-02-10 ENCOUNTER — Encounter: Payer: Self-pay | Admitting: *Deleted

## 2015-03-21 ENCOUNTER — Encounter: Payer: Self-pay | Admitting: Internal Medicine

## 2015-03-29 ENCOUNTER — Encounter: Payer: Self-pay | Admitting: Internal Medicine

## 2015-05-10 ENCOUNTER — Encounter: Payer: Self-pay | Admitting: Internal Medicine

## 2015-05-16 ENCOUNTER — Encounter: Payer: Self-pay | Admitting: Internal Medicine

## 2015-05-16 ENCOUNTER — Ambulatory Visit (INDEPENDENT_AMBULATORY_CARE_PROVIDER_SITE_OTHER): Payer: Medicare Other | Admitting: Internal Medicine

## 2015-05-16 VITALS — BP 130/82 | HR 89 | Ht 76.0 in | Wt 201.6 lb

## 2015-05-16 DIAGNOSIS — Z95810 Presence of automatic (implantable) cardiac defibrillator: Secondary | ICD-10-CM

## 2015-05-16 DIAGNOSIS — I472 Ventricular tachycardia: Secondary | ICD-10-CM | POA: Diagnosis not present

## 2015-05-16 DIAGNOSIS — I5022 Chronic systolic (congestive) heart failure: Secondary | ICD-10-CM

## 2015-05-16 DIAGNOSIS — I2510 Atherosclerotic heart disease of native coronary artery without angina pectoris: Secondary | ICD-10-CM | POA: Diagnosis not present

## 2015-05-16 DIAGNOSIS — I4729 Other ventricular tachycardia: Secondary | ICD-10-CM

## 2015-05-16 DIAGNOSIS — I480 Paroxysmal atrial fibrillation: Secondary | ICD-10-CM | POA: Diagnosis not present

## 2015-05-16 DIAGNOSIS — I10 Essential (primary) hypertension: Secondary | ICD-10-CM

## 2015-05-16 LAB — CUP PACEART INCLINIC DEVICE CHECK
Battery Remaining Longevity: 77 mo
Battery Voltage: 2.97 V
Brady Statistic AP VP Percent: 0 %
Brady Statistic AS VS Percent: 3.23 %
HighPow Impedance: 72 Ohm
Implantable Lead Implant Date: 20050511
Implantable Lead Implant Date: 20050511
Implantable Lead Implant Date: 20150424
Implantable Lead Location: 753858
Implantable Lead Location: 753860
Implantable Lead Model: 4193
Implantable Lead Model: 5076
Implantable Lead Model: 7122
Lead Channel Impedance Value: 4047 Ohm
Lead Channel Impedance Value: 4047 Ohm
Lead Channel Impedance Value: 418 Ohm
Lead Channel Impedance Value: 475 Ohm
Lead Channel Impedance Value: 589 Ohm
Lead Channel Pacing Threshold Amplitude: 0.5 V
Lead Channel Pacing Threshold Pulse Width: 0.4 ms
Lead Channel Pacing Threshold Pulse Width: 0.4 ms
Lead Channel Setting Pacing Amplitude: 1.75 V
Lead Channel Setting Pacing Amplitude: 2.5 V
Lead Channel Setting Pacing Pulse Width: 0.4 ms
Lead Channel Setting Pacing Pulse Width: 0.4 ms
MDC IDC LEAD LOCATION: 753859
MDC IDC MSMT LEADCHNL LV PACING THRESHOLD AMPLITUDE: 0.625 V
MDC IDC MSMT LEADCHNL RA SENSING INTR AMPL: 0.5 mV
MDC IDC MSMT LEADCHNL RV IMPEDANCE VALUE: 475 Ohm
MDC IDC MSMT LEADCHNL RV SENSING INTR AMPL: 8.375 mV
MDC IDC SESS DTM: 20161129180122
MDC IDC SET LEADCHNL RV SENSING SENSITIVITY: 0.3 mV
MDC IDC STAT BRADY AP VS PERCENT: 0 %
MDC IDC STAT BRADY AS VP PERCENT: 96.77 %
MDC IDC STAT BRADY RA PERCENT PACED: 0 %
MDC IDC STAT BRADY RV PERCENT PACED: 97.4 %

## 2015-05-16 NOTE — Assessment & Plan Note (Signed)
His symptoms are class 2. He will continue his current meds. He will maintain a low sodium diet. 

## 2015-05-16 NOTE — Assessment & Plan Note (Signed)
He denies anginal symptoms. No change in medications. 

## 2015-05-16 NOTE — Assessment & Plan Note (Signed)
He has had no recurrent VT therapies. Will follow.

## 2015-05-16 NOTE — Progress Notes (Signed)
HPI Spencer Shelton returns today for followup. He is a pleasant 69 yo man with chronic systolic heart failure, CHB, atrial fib, s/p BiV ICD implant.  His CHF has been well compensated. He has been under increased [place] on one of his houses, and cutting it in half. No ICD shock. Allergies  Allergen Reactions  . Statins Other (See Comments)    "hurt all over"  . Fenofibrate Rash  . Penicillins Rash  . Sulfonamide Derivatives Rash     Current Outpatient Prescriptions  Medication Sig Dispense Refill  . ALPHAGAN P 0.1 % SOLN Place 1 drop into both eyes 2 (two) times daily.     . benazepril (LOTENSIN) 40 MG tablet Take 20 mg by mouth 2 (two) times daily.     . Coenzyme Q10 (COQ10) 100 MG CAPS Take 1 capsule by mouth daily.    . Cyanocobalamin (VITAMIN B-12) 2500 MCG SUBL Place 5,000 mcg under the tongue daily.    [name] ezetimibe (ZETIA) 10 MG tablet Take 10 mg by mouth daily.      . furosemide (LASIX) 80 MG tablet Take 40 mg by mouth 2 (two) times daily.    . metoprolol (LOPRESSOR) 50 MG tablet Take 50 mg by mouth 2 (two) times daily.     . Multiple Vitamins-Minerals (MULTIVITAMIN WITH MINERALS) tablet Take 1 tablet by mouth daily.      . polyethylene glycol (MIRALAX / GLYCOLAX) packet Take 17 g by mouth daily. 14 each 0  . TRAVATAN Z 0.004 % SOLN ophthalmic solution Place 1 drop into both eyes at bedtime.     . valsartan (DIOVAN) 160 MG tablet Take 80 mg by mouth 2 (two) times daily.     [name] warfarin (COUMADIN) 5 MG tablet Take 5 mg by mouth daily.     No current facility-administered medications for this visit.     Past Medical History  Diagnosis Date  . Anxiety   . Complete heart block (HCC)   . Ventricular tachycardia (HCC)   . Cardiomyopathy (HCC)   . Atrial fibrillation (HCC)   . Hypertension   . High cholesterol   . CHF (congestive heart failure) (HCC)   . Coronary artery disease     with known occlusions of all  of his native coronary  . Anterior myocardial infarction (HCC) 1982; 1995  . Asthma     "when I was a boy"  . Cholecystitis   . Automatic implantable cardioverter-defibrillator in situ     BI VENTRICULAR  . Pneumonia     "couple times" (10/08/2013)?  [name] Type II diabetes mellitus (HCC)     no rx  . History of kidney stones   . Anemia     ? per dr Dwain Sarna nurs    ROS:   All systems reviewed and negative except as noted in the HPI.   Past Surgical History  Procedure Laterality Date  . Bi-ventricular implantable cardioverter defibrillator  (crt-d)  02/2008; 10/08/2013    MDT CRTD implanted by Dr Ladona Ridgel for primary prevention and CHF 2009 with 6949 lead; 09/2013 - gen change and RV lead revision by Dr Ladona Ridgel  . Cataract extraction w/ intraocular lens  implant, bilateral Bilateral ?2007  . Cardiac catheterization      "several"  . 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 Gallbladder drain  5/15  . Cholecystectomy N/A 12/22/2013  Procedure: LAPAROSCOPIC CHOLECYSTECTOMY, POSSIBLE OPEN;  Surgeon: Emelia Loron, MD;  Location: [facility] OR;  Service: General;  Laterality: N/A;  . Biv icd genertaor change out N/A 10/08/2013    Procedure: BIV ICD [id number] CHANGE OUT;  Surgeon: Marinus Maw, MD;  Location: [facility] CATH LAB;  Service: Cardiovascular;  Laterality: N/A;  . Lead revision N/A 10/08/2013    Procedure: LEAD REVISION;  Surgeon: Marinus Maw, MD;  Location: [facility] CATH LAB;  Service: Cardiovascular;  Laterality: N/A;     No family history on file.   Social History   Social History  . Marital Status: Married    Spouse Name: N/A  . Number of Children: N/A  . Years of Education: N/A   Occupational History  . Retired    Social History Main Topics  . Smoking status: Former Smoker -- 3.00 packs/day for 15 years    Types: Cigarettes    Quit date: 12/21/1973  . Smokeless tobacco: Never Used     Comment: "quit smoking cigarettes in 1975  . Alcohol Use: No    . Drug Use: No  . Sexual Activity: No   Other Topics Concern  . Not on file   Social History Narrative     BP 130/82 mmHg  Pulse 89  Ht 6\' 4"  (1.93 m)  Wt 201 lb 9.6 oz (91.445 kg)  BMI 24.55 kg/m2  Physical Exam:  Well appearing 69 yo man, NAD HEENT: Unremarkable Neck:  6 cm JVD, no thyromegally Back:  No CVA tenderness Lungs:  Clear with no wheezes HEART:  Regular rate rhythm, no murmurs, no rubs, no clicks Abd:  soft, positive bowel sounds, no organomegally, no rebound, no guarding Ext:  2 plus pulses, no edema, no cyanosis, no clubbing Skin:  No rashes no nodules Neuro:  CN II through XII intact, motor grossly intact  EKG - atrial fib with Bi-ventricular pacing  DEVICE  Normal device function.  See PaceArt for details.  Assess/Plan:

## 2015-05-16 NOTE — Assessment & Plan Note (Signed)
His ventricular rate is well controlled. He has complete heart block

## 2015-05-16 NOTE — Assessment & Plan Note (Signed)
His blood pressure is fairly well controlled. Will follow.

## 2015-05-16 NOTE — Patient Instructions (Signed)

## 2015-07-07 ENCOUNTER — Other Ambulatory Visit: Payer: Self-pay | Admitting: Gastroenterology

## 2015-07-07 DIAGNOSIS — K745 Biliary cirrhosis, unspecified: Secondary | ICD-10-CM

## 2015-07-17 ENCOUNTER — Other Ambulatory Visit: Payer: Medicare Other

## 2015-07-19 ENCOUNTER — Ambulatory Visit
Admission: RE | Admit: 2015-07-19 | Discharge: 2015-07-19 | Disposition: A | Discharge status: Home / Self Care | Payer: Medicare Other | Source: Ambulatory Visit | Attending: Gastroenterology | Admitting: Gastroenterology

## 2015-07-19 DIAGNOSIS — K745 Biliary cirrhosis, unspecified: Secondary | ICD-10-CM

## 2015-07-31 ENCOUNTER — Other Ambulatory Visit: Payer: Medicare Other

## 2015-09-04 ENCOUNTER — Ambulatory Visit (HOSPITAL_COMMUNITY): Admission: RE | Admit: 2015-09-04 | Payer: Medicare Other | Source: Ambulatory Visit | Admitting: Gastroenterology

## 2015-09-04 ENCOUNTER — Encounter (HOSPITAL_COMMUNITY): Admission: RE | Payer: Self-pay | Source: Ambulatory Visit

## 2015-09-04 SURGERY — ESOPHAGOGASTRODUODENOSCOPY (EGD) WITH PROPOFOL
Anesthesia: Monitor Anesthesia Care

## 2016-01-16 ENCOUNTER — Other Ambulatory Visit: Payer: Self-pay | Admitting: Gastroenterology

## 2016-01-16 DIAGNOSIS — K7469 Other cirrhosis of liver: Secondary | ICD-10-CM

## 2016-01-31 ENCOUNTER — Ambulatory Visit
Admission: RE | Admit: 2016-01-31 | Discharge: 2016-01-31 | Disposition: A | Discharge status: Home / Self Care | Payer: Medicare Other | Source: Ambulatory Visit | Attending: Gastroenterology | Admitting: Gastroenterology

## 2016-01-31 DIAGNOSIS — K7469 Other cirrhosis of liver: Secondary | ICD-10-CM

## 2016-02-08 ENCOUNTER — Other Ambulatory Visit: Payer: Self-pay | Admitting: Gastroenterology

## 2016-05-03 ENCOUNTER — Encounter: Payer: Self-pay | Admitting: Internal Medicine

## 2016-05-14 ENCOUNTER — Encounter: Payer: Self-pay | Admitting: Internal Medicine

## 2016-05-14 ENCOUNTER — Ambulatory Visit (INDEPENDENT_AMBULATORY_CARE_PROVIDER_SITE_OTHER): Payer: Medicare Other | Admitting: Internal Medicine

## 2016-05-14 VITALS — BP 132/80 | HR 81 | Ht 76.0 in | Wt 197.4 lb

## 2016-05-14 DIAGNOSIS — Z95810 Presence of automatic (implantable) cardiac defibrillator: Secondary | ICD-10-CM

## 2016-05-14 DIAGNOSIS — I480 Paroxysmal atrial fibrillation: Secondary | ICD-10-CM

## 2016-05-14 NOTE — Patient Instructions (Addendum)
Medication Instructions:  Your physician recommends that you continue on your current medications as directed. Please refer to the Current Medication list given to you today.   Labwork: None Ordered   Testing/Procedures: None Ordered   Follow-Up: Your physician wants you to follow-up in: 1 year with Dr. Taylor. You will receive a reminder letter in the mail two months in advance. If you don't receive a letter, please call our office to schedule the follow-up appointment.  Remote monitoring is used to monitor your ICD from home. This monitoring reduces the number of office visits required to check your device to one time per year. It allows us to keep an eye on the functioning of your device to ensure it is working properly. You are scheduled for a device check from home on 08/13/16. You may send your transmission at any time that day. If you have a wireless device, the transmission will be sent automatically. After your physician reviews your transmission, you will receive a postcard with your next transmission date.     Any Other Special Instructions Will Be Listed Below (If Applicable).     If you need a refill on your cardiac medications before your next appointment, please call your pharmacy.   

## 2016-05-14 NOTE — Progress Notes (Signed)
HPI [name] returns today for followup. He is a pleasant 70 yo man with chronic systolic heart failure, CHB, atrial fib, s/p BiV ICD implant.  His CHF has been well compensated. He has been under increased stress as he has had to drive to [place] beach several times a month.  No ICD shock. No CHF worsening. He remains active, limited only by a bad foot.  Allergies  Allergen Reactions  . Statins Other (See Comments)    "hurt all over"  . Fenofibrate Rash  . Penicillins Rash  . Sulfonamide Derivatives Rash     Current Outpatient Prescriptions  Medication Sig Dispense Refill  . ALPHAGAN P 0.1 % SOLN Place 1 drop into both eyes 2 (two) times daily.     . benazepril (LOTENSIN) 40 MG tablet Take 20 mg by mouth 2 (two) times daily.     . Brimonidine Tartrate-Timolol (COMBIGAN OP) Place 1 drop into both eyes 2 (two) times daily.    . Coenzyme Q10 (COQ10) 100 MG CAPS Take 1 capsule by mouth daily.    . Cyanocobalamin (VITAMIN B-12) 2500 MCG SUBL Place 5,000 mcg under the tongue daily.    [name] ezetimibe (ZETIA) 10 MG tablet Take 10 mg by mouth daily.      . furosemide (LASIX) 80 MG tablet Take 40 mg by mouth 2 (two) times daily.    . metoprolol succinate (TOPROL-XL) 25 MG 24 hr tablet Take 25 mg by mouth daily.    . Multiple Vitamins-Minerals (MULTIVITAMIN WITH MINERALS) tablet Take 1 tablet by mouth daily.      . polyethylene glycol (MIRALAX / GLYCOLAX) packet Take 17 g by mouth daily as needed (constipation).    . TRAVATAN Z 0.004 % SOLN ophthalmic solution Place 1 drop into both eyes at bedtime.     . valsartan (DIOVAN) 160 MG tablet Take 80 mg by mouth 2 (two) times daily.     [name] warfarin (COUMADIN) 5 MG tablet Take 5 mg by mouth daily.     No current facility-administered medications for this visit.      Past Medical History:  Diagnosis Date  . Anemia    ? per dr Spencer Shelton nurs  . Anterior myocardial infarction (HCC) 1982; 1995  . Anxiety   . Asthma    "when  I was a boy"  . Atrial fibrillation (HCC)   . 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 Cardiomyopathy (HCC)   . CHF (congestive heart failure) (HCC)   . Cholecystitis   . Complete heart block (HCC)   . Coronary artery disease    with known occlusions of all of his native coronary  . High cholesterol   . History of kidney stones   . Hypertension   . Pneumonia    "couple times" (10/08/2013)?  [name] Type II diabetes mellitus (HCC)    no rx  . Ventricular tachycardia (HCC)     ROS:   All systems reviewed and negative except as noted in the HPI.   Past Surgical History:  Procedure Laterality Date  . BI-VENTRICULAR IMPLANTABLE CARDIOVERTER DEFIBRILLATOR  (CRT-D)  02/2008; 10/08/2013   MDT CRTD implanted by Dr Spencer Shelton for primary prevention and CHF 2009 with 6949 lead; 09/2013 - gen change and RV lead revision by Dr Spencer Shelton  . BIV ICD GENERTAOR CHANGE OUT N/A 10/08/2013   Procedure: BIV ICD GENERTAOR CHANGE OUT;  Surgeon: Spencer Maw, MD;  Location: [facility] CATH LAB;  Service: Cardiovascular;  Laterality: N/A;  . CARDIAC CATHETERIZATION     "several"  . CATARACT EXTRACTION W/ INTRAOCULAR LENS  IMPLANT, BILATERAL Bilateral ?2007  . CHOLECYSTECTOMY N/A 12/22/2013   Procedure: LAPAROSCOPIC CHOLECYSTECTOMY, POSSIBLE OPEN;  Surgeon: Spencer LoronMatthew Wakefield, MD;  Location[facility];  Service: General;  Laterality: N/A;  . CORONARY ARTERY BYPASS GRAFT  1982/1996   "CABG X5; CABG X4"   . gallbladder drain  5/15  . LEAD REVISION N/A 10/08/2013   Procedure: LEAD REVISION;  Surgeon: Spencer MawGregg W Taylor, MD;  [facility];  Service: Cardiovascular;  Laterality: N/A;     No family history on file.   Social History   Social History  . Marital status: Married    Spouse name: N/A  . Number of children: N/A  . Years of education: N/A   Occupational History  . Retired Retired   Social History Main Topics  . Smoking status: Former Smoker    Packs/day: 3.00    Years:  15.00    Types: Cigarettes    Quit date: 12/21/1973  . Smokeless tobacco: Never Used     Comment: "quit smoking cigarettes in 1975  . Alcohol use No  . Drug use: No  . Sexual activity: No   Other Topics Concern  . Not on file   Social History Narrative  . No narrative on file     BP 132/80   Pulse 81   Ht 6\' 4"  (1.93 m)   Wt 197 lb 6.4 oz (89.5 kg)   BMI 24.03 kg/m   Physical Exam:  Well appearing 70 yo man, NAD HEENT: Unremarkable Neck:  6 cm JVD, no thyromegally Back:  No CVA tenderness Lungs:  Clear with no wheezes HEART:  Regular rate rhythm, no murmurs, no rubs, no clicks Abd:  soft, positive bowel sounds, no organomegally, no rebound, no guarding Ext:  2 plus pulses, no edema, no cyanosis, no clubbing Skin:  No rashes no nodules Neuro:  CN II through XII intact, motor grossly intact  EKG - atrial fib with Bi-ventricular pacing  DEVICE  Normal device function.  See PaceArt for details.  Assess/Plan: 1. Atrial fib - his ventricular rate is well controlled. No change in meds. 2. Chronic systolic heart failure - his symptoms are class 2. He will continue his current meds. 3. ICD - his device is working normally. We will recheck in several months. 4. Dyslipidemia - continue zetia  Spencer Shelton,M.D.

## 2016-05-15 LAB — CUP PACEART INCLINIC DEVICE CHECK
Brady Statistic AP VS Percent: 0 %
Brady Statistic AS VP Percent: 95.57 %
Date Time Interrogation Session: 20171128200030
HIGH POWER IMPEDANCE MEASURED VALUE: 77 Ohm
Implantable Lead Implant Date: 20050511
Implantable Lead Implant Date: 20050511
Implantable Lead Location: 753860
Lead Channel Impedance Value: 4047 Ohm
Lead Channel Impedance Value: 475 Ohm
Lead Channel Impedance Value: 513 Ohm
Lead Channel Pacing Threshold Amplitude: 0.625 V
Lead Channel Pacing Threshold Pulse Width: 0.4 ms
Lead Channel Sensing Intrinsic Amplitude: 0.375 mV
Lead Channel Sensing Intrinsic Amplitude: 0.5 mV
Lead Channel Sensing Intrinsic Amplitude: 9.125 mV
Lead Channel Setting Pacing Pulse Width: 0.4 ms
Lead Channel Setting Sensing Sensitivity: 0.3 mV
MDC IDC LEAD IMPLANT DT: 20150424
MDC IDC LEAD LOCATION: 753858
MDC IDC LEAD LOCATION: 753859
MDC IDC LEAD MODEL: 7122
MDC IDC MSMT BATTERY REMAINING LONGEVITY: 61 mo
MDC IDC MSMT BATTERY VOLTAGE: 2.97 V
MDC IDC MSMT LEADCHNL LV IMPEDANCE VALUE: 4047 Ohm
MDC IDC MSMT LEADCHNL RA IMPEDANCE VALUE: 418 Ohm
MDC IDC MSMT LEADCHNL RV IMPEDANCE VALUE: 646 Ohm
MDC IDC MSMT LEADCHNL RV PACING THRESHOLD AMPLITUDE: 0.5 V
MDC IDC MSMT LEADCHNL RV PACING THRESHOLD PULSEWIDTH: 0.4 ms
MDC IDC MSMT LEADCHNL RV SENSING INTR AMPL: 8.25 mV
MDC IDC PG IMPLANT DT: 20150424
MDC IDC SET LEADCHNL LV PACING AMPLITUDE: 1.75 V
MDC IDC SET LEADCHNL LV PACING PULSEWIDTH: 0.4 ms
MDC IDC SET LEADCHNL RV PACING AMPLITUDE: 2.5 V
MDC IDC STAT BRADY AP VP PERCENT: 0 %
MDC IDC STAT BRADY AS VS PERCENT: 4.43 %
MDC IDC STAT BRADY RA PERCENT PACED: 0 %
MDC IDC STAT BRADY RV PERCENT PACED: 96.21 %

## 2016-08-11 ENCOUNTER — Telehealth: Payer: Self-pay | Admitting: Cardiology

## 2016-08-11 NOTE — Telephone Encounter (Signed)
Lab called with elevated INR drawn on Friday.  INR 5.5.  I called the pt - he has not had any bleeding - he has been on z pak.  His last dose was 2 days ago.  Pt will hold coumadin tonight and check with Dr. Donnie Aho in the AM

## 2016-08-13 ENCOUNTER — Telehealth: Payer: Self-pay | Admitting: Cardiology

## 2016-08-13 ENCOUNTER — Encounter: Payer: Medicare Other | Admitting: *Deleted

## 2016-08-13 NOTE — Telephone Encounter (Signed)
Confirmed remote transmission w/ pt wife.   

## 2016-08-26 ENCOUNTER — Other Ambulatory Visit: Payer: Self-pay | Admitting: Gastroenterology

## 2016-08-26 DIAGNOSIS — K7469 Other cirrhosis of liver: Secondary | ICD-10-CM

## 2016-09-09 ENCOUNTER — Other Ambulatory Visit: Payer: Medicare Other

## 2016-09-09 ENCOUNTER — Ambulatory Visit
Admission: RE | Admit: 2016-09-09 | Discharge: 2016-09-09 | Disposition: A | Discharge status: Home / Self Care | Payer: Medicare Other | Source: Ambulatory Visit | Attending: Gastroenterology | Admitting: Gastroenterology

## 2016-09-09 DIAGNOSIS — K7469 Other cirrhosis of liver: Secondary | ICD-10-CM

## 2017-02-20 ENCOUNTER — Other Ambulatory Visit: Payer: Self-pay | Admitting: Gastroenterology

## 2017-02-20 DIAGNOSIS — K7469 Other cirrhosis of liver: Secondary | ICD-10-CM

## 2017-04-08 ENCOUNTER — Ambulatory Visit
Admission: RE | Admit: 2017-04-08 | Discharge: 2017-04-08 | Disposition: A | Discharge status: Home / Self Care | Payer: Medicare Other | Source: Ambulatory Visit | Attending: Gastroenterology | Admitting: Gastroenterology

## 2017-04-08 DIAGNOSIS — K7469 Other cirrhosis of liver: Secondary | ICD-10-CM

## 2017-08-15 ENCOUNTER — Other Ambulatory Visit: Payer: Self-pay | Admitting: Cardiology

## 2017-08-15 DIAGNOSIS — I679 Cerebrovascular disease, unspecified: Secondary | ICD-10-CM

## 2017-08-18 ENCOUNTER — Ambulatory Visit (HOSPITAL_COMMUNITY)
Admission: RE | Admit: 2017-08-18 | Discharge: 2017-08-18 | Disposition: A | Discharge status: Home / Self Care | Payer: Medicare Other | Source: Ambulatory Visit | Attending: Vascular Surgery | Admitting: Vascular Surgery

## 2017-08-18 DIAGNOSIS — I679 Cerebrovascular disease, unspecified: Secondary | ICD-10-CM

## 2017-08-18 DIAGNOSIS — I6529 Occlusion and stenosis of unspecified carotid artery: Secondary | ICD-10-CM | POA: Diagnosis present

## 2017-08-18 LAB — VAS US CAROTID
LCCADDIAS: -11 cm/s
LCCADSYS: -65 cm/s
LEFT ECA DIAS: -9 cm/s
LEFT VERTEBRAL DIAS: 15 cm/s
LICADDIAS: -20 cm/s
LICADSYS: -105 cm/s
LICAPDIAS: -30 cm/s
Left CCA prox dias: 17 cm/s
Left CCA prox sys: 108 cm/s
Left ICA prox sys: -122 cm/s
RIGHT CCA MID DIAS: 14 cm/s
RIGHT ECA DIAS: -2 cm/s
RIGHT VERTEBRAL DIAS: -11 cm/s
Right CCA prox dias: 11 cm/s
Right CCA prox sys: 85 cm/s
Right cca dist sys: -105 cm/s

## 2017-12-19 ENCOUNTER — Encounter: Payer: Self-pay | Admitting: Cardiology

## 2018-01-23 LAB — PROTIME-INR

## 2018-04-09 ENCOUNTER — Encounter: Payer: Self-pay | Admitting: Cardiology

## 2018-04-15 ENCOUNTER — Telehealth: Payer: Self-pay | Admitting: *Deleted

## 2018-04-15 ENCOUNTER — Encounter: Payer: Self-pay | Admitting: Cardiology

## 2018-04-15 NOTE — Telephone Encounter (Signed)
Spoke with patient regarding INR results. Advised him that Dr. Dulce Sellar recommended him have his INR rechecked today. Patient agreeable and verbalized understanding. Informed him that our office would contact him with further dosing instructions as soon as results are received and reviewed.  Contacted Brandy at Dr. York Spaniel office informing her of the plan and asked her to fax over INR results for Dr. Dulce Sellar to review as soon as this information is received. Brandy verbalized understanding.

## 2018-04-16 LAB — PROTIME-INR

## 2018-04-16 NOTE — Telephone Encounter (Signed)
Received patient's INR results from yesterda from Dr. York Spaniel office. INR 1.8. Patient denies any skipped doses, no changes in medications, no upcoming procedures/hospitalizations, no diet changes. Dr. Dulce Sellar reviewed and recommended patient take 7.5 tonight, 5 mg Friday, 5 mg Saturday and repeat this cycle as 7.5mg /5mg /5mg  until INR recheck in 1 week on Thursday, 04/23/18. Patient verbalized understanding. No further questions.

## 2018-04-21 ENCOUNTER — Ambulatory Visit (INDEPENDENT_AMBULATORY_CARE_PROVIDER_SITE_OTHER): Payer: Medicare Other | Admitting: *Deleted

## 2018-04-21 DIAGNOSIS — I679 Cerebrovascular disease, unspecified: Secondary | ICD-10-CM

## 2018-04-22 ENCOUNTER — Other Ambulatory Visit: Payer: Self-pay | Admitting: Cardiology

## 2018-04-22 MED ORDER — FUROSEMIDE 80 MG PO TABS: 80.0000 mg | ORAL_TABLET | Freq: Two times a day (BID) | ORAL | 0 refills | Status: DC

## 2018-04-22 NOTE — Telephone Encounter (Signed)
Furosemide 80mg  BID #180  [id number] [place] Patient has appt 11//15 with Dr Josiah Lobo

## 2018-04-22 NOTE — Progress Notes (Signed)
Remote ICD transmission.   

## 2018-04-22 NOTE — Telephone Encounter (Signed)
Lasix 80 mg twice daily refilled per Dr. Bing Matter.

## 2018-04-23 LAB — PROTIME-INR: INR: 2.1 — AB (ref 0.9–1.1)

## 2018-04-26 ENCOUNTER — Encounter: Payer: Self-pay | Admitting: Cardiology

## 2018-04-27 ENCOUNTER — Ambulatory Visit (INDEPENDENT_AMBULATORY_CARE_PROVIDER_SITE_OTHER): Payer: Medicare Other | Admitting: Cardiovascular Disease

## 2018-04-27 DIAGNOSIS — I480 Paroxysmal atrial fibrillation: Secondary | ICD-10-CM

## 2018-04-27 DIAGNOSIS — Z5181 Encounter for therapeutic drug level monitoring: Secondary | ICD-10-CM

## 2018-04-27 NOTE — Patient Instructions (Signed)
Description   Spoke with pt and instructed pt to continue taking Coumadin 5mg  daily except 7.5mg  on Mondays. Recheck INR 2 weeks from when last result was done (prefers Monday). [facility] [phone]

## 2018-04-29 ENCOUNTER — Telehealth: Payer: Self-pay | Admitting: Cardiology

## 2018-04-29 MED ORDER — WARFARIN SODIUM 5 MG PO TABS: ORAL_TABLET | ORAL | 1 refills | Status: AC

## 2018-04-29 NOTE — Telephone Encounter (Signed)
°  Patient has appt with [facility] on 05/01/18   1. Which medications need to be refilled? (please list name of each medication and dose if known) coumadin 5mg  1QD or as directed  2. Which pharmacy/location (including street and city if local pharmacy) is medication to be sent to?[facility] [phone]  3. Do they need a 30 day or 90 day supply? 90

## 2018-04-29 NOTE — Telephone Encounter (Signed)
Refill sent.

## 2018-05-01 ENCOUNTER — Ambulatory Visit: Payer: Medicare Other | Admitting: Cardiology

## 2018-05-04 LAB — PROTIME-INR: INR: 1.8 — AB (ref 0.9–1.1)

## 2018-05-05 ENCOUNTER — Ambulatory Visit (INDEPENDENT_AMBULATORY_CARE_PROVIDER_SITE_OTHER): Payer: Medicare Other | Admitting: Pharmacist

## 2018-05-05 DIAGNOSIS — I480 Paroxysmal atrial fibrillation: Secondary | ICD-10-CM | POA: Diagnosis not present

## 2018-05-05 DIAGNOSIS — Z5181 Encounter for therapeutic drug level monitoring: Secondary | ICD-10-CM | POA: Diagnosis not present

## 2018-05-20 ENCOUNTER — Telehealth: Payer: Self-pay | Admitting: Cardiology

## 2018-05-20 ENCOUNTER — Other Ambulatory Visit: Payer: Self-pay | Admitting: Cardiology

## 2018-05-20 LAB — PROTIME-INR: INR: 2.1 — AB (ref 0.9–1.1)

## 2018-05-20 NOTE — Telephone Encounter (Signed)
Hayley did Dr. Dulce Sellar review this patient's chart earlier for furosemide? Please fill this one if he has and is okay with doing so. Thanks!

## 2018-05-20 NOTE — Telephone Encounter (Signed)
Lasix 80 mg twice daily refilled per Dr. Dulce Sellar

## 2018-05-20 NOTE — Telephone Encounter (Signed)
Medication has been refilled.

## 2018-05-20 NOTE — Telephone Encounter (Signed)
°  Patient has appt in [facility] later this month but needs refill before then   1. Which medications need to be refilled? (please list name of each medication and dose if known) furosemide 80mg  1BID  2. Which pharmacy/location (including street and city if local pharmacy) is medication to be sent to? [facility] [address] [place]  3. Do they need a 30 day or 90 day supply? 30

## 2018-05-22 ENCOUNTER — Ambulatory Visit (INDEPENDENT_AMBULATORY_CARE_PROVIDER_SITE_OTHER): Payer: Medicare Other | Admitting: Internal Medicine

## 2018-05-22 DIAGNOSIS — I480 Paroxysmal atrial fibrillation: Secondary | ICD-10-CM | POA: Diagnosis not present

## 2018-05-22 DIAGNOSIS — Z5181 Encounter for therapeutic drug level monitoring: Secondary | ICD-10-CM

## 2018-06-04 LAB — PROTIME-INR: INR: 2.1 — AB (ref 0.9–1.1)

## 2018-06-05 ENCOUNTER — Ambulatory Visit (INDEPENDENT_AMBULATORY_CARE_PROVIDER_SITE_OTHER): Payer: Medicare Other | Admitting: Pharmacist

## 2018-06-05 DIAGNOSIS — Z5181 Encounter for therapeutic drug level monitoring: Secondary | ICD-10-CM | POA: Diagnosis not present

## 2018-06-05 DIAGNOSIS — I480 Paroxysmal atrial fibrillation: Secondary | ICD-10-CM

## 2018-06-21 LAB — CUP PACEART REMOTE DEVICE CHECK
Brady Statistic AP VP Percent: 0 %
Brady Statistic AP VS Percent: 0 %
Brady Statistic AS VS Percent: 4.9 %
Brady Statistic RA Percent Paced: 0 %
Brady Statistic RV Percent Paced: 94.12 %
HighPow Impedance: 77 Ohm
Implantable Lead Implant Date: 20050511
Implantable Lead Implant Date: 20150424
Implantable Lead Location: 753859
Implantable Lead Model: 4193
Implantable Lead Model: 5076
Implantable Lead Model: 7122
Lead Channel Impedance Value: 4047 Ohm
Lead Channel Impedance Value: 4047 Ohm
Lead Channel Impedance Value: 418 Ohm
Lead Channel Impedance Value: 532 Ohm
Lead Channel Impedance Value: 665 Ohm
Lead Channel Pacing Threshold Amplitude: 0.5 V
Lead Channel Pacing Threshold Pulse Width: 0.4 ms
Lead Channel Pacing Threshold Pulse Width: 0.4 ms
Lead Channel Sensing Intrinsic Amplitude: 0.5 mV
Lead Channel Setting Pacing Amplitude: 2.5 V
MDC IDC LEAD IMPLANT DT: 20050511
MDC IDC LEAD LOCATION: 753858
MDC IDC LEAD LOCATION: 753860
MDC IDC MSMT BATTERY REMAINING LONGEVITY: 31 mo
MDC IDC MSMT BATTERY VOLTAGE: 2.92 V
MDC IDC MSMT LEADCHNL LV IMPEDANCE VALUE: 475 Ohm
MDC IDC MSMT LEADCHNL RA SENSING INTR AMPL: 0.375 mV
MDC IDC MSMT LEADCHNL RV PACING THRESHOLD AMPLITUDE: 0.5 V
MDC IDC MSMT LEADCHNL RV SENSING INTR AMPL: 8.125 mV
MDC IDC MSMT LEADCHNL RV SENSING INTR AMPL: 8.125 mV
MDC IDC PG IMPLANT DT: 20150424
MDC IDC SESS DTM: 20191105062404
MDC IDC SET LEADCHNL LV PACING AMPLITUDE: 1.5 V
MDC IDC SET LEADCHNL LV PACING PULSEWIDTH: 0.4 ms
MDC IDC SET LEADCHNL RV PACING PULSEWIDTH: 0.4 ms
MDC IDC SET LEADCHNL RV SENSING SENSITIVITY: 0.3 mV
MDC IDC STAT BRADY AS VP PERCENT: 95.1 %

## 2018-06-26 ENCOUNTER — Telehealth: Payer: Self-pay | Admitting: *Deleted

## 2018-06-26 NOTE — Telephone Encounter (Signed)
Called pt because he is overdue for an INR recheck. Pt was due 06/25/2018; spoke with pt and he stated that the order expired on 06/17/2018 so he was unable to get his labs drawn. Also, he stated that he has an appointment with his new Cardiologist Dr. Jetta Lout on 06/30/2018 and he will make sure that the doctor addresses this. He stated that if their is an issue he will call us back. Advised pt that we can send an order to have INR done for next 30 days or until he gets established with Cardiologist. Pt will call back to update.

## 2018-07-03 ENCOUNTER — Telehealth: Payer: Self-pay | Admitting: *Deleted

## 2018-07-03 ENCOUNTER — Encounter: Payer: Self-pay | Admitting: Internal Medicine

## 2018-07-03 NOTE — Telephone Encounter (Signed)
Called the pt because he was due an INR on 06/25/2018. Pt states he went to visit his new Cardiologist in [facility] and they checked his INR yesterday and it was 1.9; he stated the gave him instructions and he has to follow up in two weeks with them. Pt aware we will remove him from our Anticoagulation Encounters.

## 2018-07-21 ENCOUNTER — Ambulatory Visit: Payer: Medicare Other

## 2018-07-21 ENCOUNTER — Encounter: Payer: Medicare Other | Admitting: Internal Medicine

## 2018-07-21 DIAGNOSIS — I429 Cardiomyopathy, unspecified: Secondary | ICD-10-CM

## 2018-07-21 DIAGNOSIS — I428 Other cardiomyopathies: Secondary | ICD-10-CM

## 2018-07-21 DIAGNOSIS — I480 Paroxysmal atrial fibrillation: Secondary | ICD-10-CM

## 2018-07-24 LAB — CUP PACEART REMOTE DEVICE CHECK
Battery Voltage: 2.9 V
Brady Statistic AP VP Percent: 0 %
Brady Statistic AS VP Percent: 95.59 %
Brady Statistic RA Percent Paced: 0 %
Date Time Interrogation Session: 20200204051604
HIGH POWER IMPEDANCE MEASURED VALUE: 68 Ohm
Implantable Lead Implant Date: 20050511
Implantable Lead Implant Date: 20050511
Implantable Lead Model: 4193
Implantable Lead Model: 7122
Implantable Pulse Generator Implant Date: 20150424
Lead Channel Impedance Value: 4047 Ohm
Lead Channel Impedance Value: 475 Ohm
Lead Channel Pacing Threshold Amplitude: 0.5 V
Lead Channel Pacing Threshold Pulse Width: 0.4 ms
Lead Channel Pacing Threshold Pulse Width: 0.4 ms
Lead Channel Sensing Intrinsic Amplitude: 0.375 mV
Lead Channel Sensing Intrinsic Amplitude: 0.5 mV
Lead Channel Sensing Intrinsic Amplitude: 7.875 mV
Lead Channel Sensing Intrinsic Amplitude: 7.875 mV
Lead Channel Setting Pacing Amplitude: 1.75 V
Lead Channel Setting Pacing Amplitude: 2.5 V
Lead Channel Setting Pacing Pulse Width: 0.4 ms
Lead Channel Setting Sensing Sensitivity: 0.3 mV
MDC IDC LEAD IMPLANT DT: 20150424
MDC IDC LEAD LOCATION: 753858
MDC IDC LEAD LOCATION: 753859
MDC IDC LEAD LOCATION: 753860
MDC IDC MSMT BATTERY REMAINING LONGEVITY: 30 mo
MDC IDC MSMT LEADCHNL LV IMPEDANCE VALUE: 4047 Ohm
MDC IDC MSMT LEADCHNL LV IMPEDANCE VALUE: 456 Ohm
MDC IDC MSMT LEADCHNL LV PACING THRESHOLD AMPLITUDE: 0.625 V
MDC IDC MSMT LEADCHNL RA IMPEDANCE VALUE: 418 Ohm
MDC IDC MSMT LEADCHNL RV IMPEDANCE VALUE: 589 Ohm
MDC IDC SET LEADCHNL LV PACING PULSEWIDTH: 0.4 ms
MDC IDC STAT BRADY AP VS PERCENT: 0 %
MDC IDC STAT BRADY AS VS PERCENT: 4.41 %
MDC IDC STAT BRADY RV PERCENT PACED: 95.83 %

## 2018-07-30 NOTE — Progress Notes (Signed)
Remote ICD transmission.   

## 2018-08-06 ENCOUNTER — Other Ambulatory Visit: Payer: Self-pay | Admitting: General Surgery

## 2018-08-31 ENCOUNTER — Encounter (HOSPITAL_COMMUNITY): Payer: Self-pay | Admitting: Physician Assistant

## 2018-08-31 NOTE — Anesthesia Preprocedure Evaluation (Deleted)
Anesthesia Evaluation    Airway        Dental   Pulmonary former smoker,           Cardiovascular hypertension,      Neuro/Psych    GI/Hepatic   Endo/Other  diabetes  Renal/GU      Musculoskeletal   Abdominal   Peds  Hematology   Anesthesia Other Findings   Reproductive/Obstetrics                             Anesthesia Physical Anesthesia Plan  ASA:   Anesthesia Plan:    Post-op Pain Management:    Induction:   PONV Risk Score and Plan:   Airway Management Planned:   Additional Equipment:   Intra-op Plan:   Post-operative Plan:   Informed Consent:   Plan Discussed with:   Anesthesia Plan Comments: (See PAT note by Alynah Schone, PA-C )        Anesthesia Quick Evaluation  

## 2018-08-31 NOTE — Progress Notes (Signed)
Anesthesia Chart Review: SAME DAY WORKUP   Case:  [id number] Date/Time:  10/06/18 0715   Procedure:  RIGHT HERNIA REPAIR INGUINAL WITH MESH (Right ) - GENERAL AND TAP BLOCK ANESTHESIA   Anesthesia type:  General   Pre-op diagnosis:  right inguinal hernia   [facility] RO[facility] / [facility]   Surgeon:  Emelia Loron, MD      DISCUSSION: 73 yo male former smoker. Pertinent hx includes pAfib, MI, CAD s/p remote CABG, CHB s/p BiV ACD, HFrEF (EF 30-35% by echo 2020), Cardiomyopathy, HTN.  Pt was pt of Dr. York Spaniel for many years. He has recently transitioned cardiology care to Dr. Alyson Locket at [facility] in [place]. He was cleared for surgery 08/05/18, per clearance on chart: "Patient is an acceptable candidate for the proposed procedure.  He may hold his warfarin 5 days prior to procedure and restart when acceptable with surgeon. [employer] AICD rep should be contacted with surgery date." He had an echo done at their office 08/06/2018 that showed EF 30 to 35%, moderate anterior wall hypokinesis, severe apex hypokinesis. Normal right ventricular size, wall dimension, and systolic function.  Moderate MR.  Mild TR.  CHB s/p BiV ACD followed by Dr. Lewayne Bunting. Last remote device check 07/21/18 "Remote device check reviewed. Histograms appropriate. Leads and battery stable for patient. Follow up as outlined above. No recommended changes." Pt is currently transitioning to the device clinic at [facility] in [facility].   Anticipate he can proceed as planned barring acute status change.  VS: There were no vitals taken for this visit.  PROVIDERS: Patient, No Pcp Per  Alyson Locket, MD is Cardiologist  Lewayne Bunting, MD is EP Cardiologist  LABS:  Labs Reviewed - No data to display  EKG: Requested, if not received will need DOS.   CV: TTE 08/06/18 (copy on pt chart): Summary: 1.  LV systolic function is moderately reduced. 2.  The anterior wall appears  moderately hypokinetic. 3.  The apex appears severely hypokinetic. 4.  Estimated left ventricle ejection fraction is 30 to 35%. 5.  There is a pacemaker wire in the RV. 6.  Left atrium chamber is moderately dilated. 7.  LA diameter is 5.4 cm. 8.  The aortic valve is mildly calcified. 9.  There is mild mitral annular calcification. 10.  There is moderate mitral regurgitation. 11.  There is mild tricuspid rotation. 12.  The estimated RV systolic pressure is normal. 13.  The estimated RV systolic pressure is 32 mmHg. 14.  The size of the visualized portion of the aortic root is within normal limits.  Carotid US 08/18/2017: Final Interpretation: Right Carotid: Velocities in the right ICA are consistent with a 1-39% stenosis.  Left Carotid: Velocities in the left ICA are consistent with a 1-39% stenosis.       The ECA appears >50% stenosed.        Calcific plaque may obscure higher velocity bilaterally .  Vertebrals: Both vertebral arteries were patent with antegrade flow.  Past Medical History:  Diagnosis Date  . Anemia    ? per dr Dwain Sarna nurs  . Anterior myocardial infarction (HCC) 1982; 1995  . Anxiety   . Asthma    "when I was a boy"  . Atrial fibrillation (HCC)   . 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 Cardiomyopathy (HCC)   . CHF (congestive heart failure) (HCC)   . Cholecystitis   . Complete heart block (  HCC)   . Coronary artery disease    with known occlusions of all of his native coronary  . High cholesterol   . History of kidney stones   . Hypertension   . Pneumonia    "couple times" (10/08/2013)?  [name] Type II diabetes mellitus (HCC)    no rx  . Ventricular tachycardia [facility])     Past Surgical History:  Procedure Laterality Date  . BI-VENTRICULAR IMPLANTABLE CARDIOVERTER DEFIBRILLATOR  (CRT-D)  02/2008; 10/08/2013   MDT CRTD implanted by Dr Ladona Ridgel for primary prevention and CHF 2009 with 6949 lead; 09/2013  - gen change and RV lead revision by Dr Ladona Ridgel  . BIV ICD GENERTAOR CHANGE OUT N/A 10/08/2013   Procedure: BIV ICD GENERTAOR CHANGE OUT;  Surgeon: Marinus Maw, MD;  Location: [facility] CATH LAB;  Service: Cardiovascular;  Laterality: N/A;  . CARDIAC CATHETERIZATION     "several"  . CATARACT EXTRACTION W/ INTRAOCULAR LENS  IMPLANT, BILATERAL Bilateral ?2007  . CHOLECYSTECTOMY N/A 12/22/2013   Procedure: LAPAROSCOPIC CHOLECYSTECTOMY, POSSIBLE OPEN;  Surgeon: Emelia Loron, MD;  Location: [facility] OR;  Service: General;  Laterality: N/A;  . CORONARY ARTERY BYPASS GRAFT  1982/1996   "CABG X5; CABG X4"   . gallbladder drain  5/15  . LEAD REVISION N/A 10/08/2013   Procedure: LEAD REVISION;  Surgeon: Marinus Maw, MD;  Location: [facility] CATH LAB;  Service: Cardiovascular;  Laterality: N/A;    MEDICATIONS: No current facility-administered medications for this encounter.    . benazepril (LOTENSIN) 40 MG tablet  . CINNAMON PO  . Coenzyme Q10 (COQ-10 PO)  . COMBIGAN 0.2-0.5 % ophthalmic solution  . Cyanocobalamin (VITAMIN B-12) 2500 MCG SUBL  . ezetimibe (ZETIA) 10 MG tablet  . furosemide (LASIX) 80 MG tablet  . Glucosamine-Chondroitin (MOVE FREE PO)  . metoprolol succinate (TOPROL-XL) 25 MG 24 hr tablet  . Multiple Vitamins-Minerals (MULTIVITAMIN WITH MINERALS) tablet  . nitroGLYCERIN (NITROSTAT) 0.4 MG SL tablet  . polyethylene glycol (MIRALAX / GLYCOLAX) packet  . Polyvinyl Alcohol (LIQUID TEARS OP)  . TRAVATAN Z 0.004 % SOLN ophthalmic solution  . warfarin (COUMADIN) 5 MG tablet   [place] [facility] Short Stay Center/Anesthesiology Phone [phone] 08/31/2018 9:55 AM

## 2018-10-06 ENCOUNTER — Ambulatory Visit (HOSPITAL_COMMUNITY): Admission: RE | Admit: 2018-10-06 | Payer: Medicare Other | Source: Home / Self Care | Admitting: General Surgery

## 2018-10-06 ENCOUNTER — Encounter (HOSPITAL_COMMUNITY): Admission: RE | Payer: Self-pay | Source: Home / Self Care

## 2018-10-06 SURGERY — REPAIR, HERNIA, INGUINAL, ADULT
Anesthesia: General | Laterality: Right

## 2018-10-22 ENCOUNTER — Other Ambulatory Visit: Payer: Self-pay | Admitting: General Surgery

## 2018-11-30 ENCOUNTER — Telehealth: Payer: Self-pay | Admitting: Cardiology

## 2018-11-30 NOTE — Telephone Encounter (Signed)
LMOVM for pt and wife to return call. I wanted to inform the patient that the February 2020 charges have been deleted.

## 2018-11-30 NOTE — Telephone Encounter (Signed)
-----   Message from Delma Officer sent at 11/25/2018 11:24 AM EDT ----- Regarding: February remote charges Good Morning Ladies,  I received a call this morning from Mrs. Murdaugh regarding charges for the February device check.  Patient lives in [place] and has started seeing a cardilogist there.  She also indicated that she notified the clinic here in December that his care was being transferred.  Are we able to void this charge?  Suanne Marker

## 2018-11-30 NOTE — Telephone Encounter (Signed)
Pt wife returned [place] phone call. I informed her per [facility] phone not we have deleted February 2020 charges.

## 2019-01-12 ENCOUNTER — Encounter (HOSPITAL_COMMUNITY): Admission: RE | Payer: Self-pay | Source: Home / Self Care

## 2019-01-12 ENCOUNTER — Ambulatory Visit (HOSPITAL_COMMUNITY): Admission: RE | Admit: 2019-01-12 | Payer: Medicare Other | Source: Home / Self Care | Admitting: General Surgery

## 2019-01-12 SURGERY — REPAIR, HERNIA, INGUINAL, ADULT
Anesthesia: General | Laterality: Right

## 2019-02-03 ENCOUNTER — Other Ambulatory Visit: Payer: Self-pay | Admitting: General Surgery

## 2019-02-03 NOTE — Progress Notes (Addendum)
[place] [address] Phone: [phone] Fax: [phone]    Your procedure is scheduled on Monday, August 24th.  Report to [facility] Shel[facility] Main Entrance "A" at 7:30 A.M., and check in at the Admitting office.  Call this number if you have problems the morning of surgery:  [phone]  Call [phone] if you have any questions prior to your surgery date Monday-Friday 8am-4pm   Remember:  Do not eat after midnight the night before your surgery  You may drink clear liquids until 6:30 the morning of your surgery.   Clear liquids allowed are: Water, Non-Citrus Juices (without pulp), Carbonated Beverages, Clear Tea, Black Coffee Only, and Gatorade   Please complete your PRE-SURGERY G2 that was provided to you by ... the morning of surgery.  Please, if able, drink it in one setting. DO NOT SIP.   Take these medicines the morning of surgery with A SIP OF WATER  ezetimibe (ZETIA) metoprolol succinate (TOPROL-XL)   If needed - COMBIGAN 0.2-0.5 % ophthalmic solution- eye drops, nitroGLYCERIN (NITROSTAT), polyethylene glycol (MIRALAX / GLYCOLAX), Polyvinyl Alcohol (LIQUID TEARS OP), TRAVATAN Z 0.004 % SOLN ophthalmic solution  7 days prior to surgery STOP taking any Aspirin or/and Coumadin (unless otherwise instructed by your surgeon), Aleve, Naproxen, Ibuprofen, Motrin, Advil, Goody's, BC's, all herbal medications, fish oil, and all vitamins.  How to Manage Your Diabetes Before and After Surgery  How do I manage my blood sugar before surgery? . Check your blood sugar at least 4 times a day, starting 2 days before surgery, to make sure that the level is not too high or low. o Check your blood sugar the morning of your surgery when you wake up and every 2 hours until you get to the Short Stay unit. . If your blood sugar is less than 70 mg/dL, you will need to treat for low blood sugar: o Do not take  insulin. o Treat a low blood sugar (less than 70 mg/dL) with  cup of clear juice (cranberry or apple), 4 glucose tablets, OR glucose gel. Recheck blood sugar in 15 minutes after treatment (to make sure it is greater than 70 mg/dL). If your blood sugar is not greater than 70 mg/dL on recheck, call [phone] o  for further instructions. . Report your blood sugar to the short stay nurse when you get to Short Stay.  . If you are admitted to the hospital after surgery: o Your blood sugar will be checked by the staff and you will probably be given insulin after surgery (instead of oral diabetes medicines) to make sure you have good blood sugar levels. o The goal for blood sugar control after surgery is 80-180 mg/dL.  WHAT DO I DO ABOUT MY DIABETES MEDICATION?  [name] Do not take oral diabetes medicines (pills) the morning of surgery.  Reviewed and Endorsed by [facility] Patient Education Committee, August 2015   The Morning of Surgery  Do not wear jewelry, make-up or nail polish.  Do not wear lotions, powders, or perfumes/colognes, or deodorant  Do not shave 48 hours prior to surgery.  Men may shave face and neck.  Do not bring valuables to the hospital.  [facility] is not responsible for any belongings or valuables.  If you are a smoker, DO NOT Smoke 24 hours prior to surgery IF you wear a CPAP at night please bring your mask, tubing, and machine the  morning of surgery   Remember that you must have someone to transport you home after your surgery, and remain with you for 24 hours if you are discharged the same day.  Contacts, glasses, hearing aids, dentures or bridgework may not be worn into surgery.   Leave your suitcase in the car.  After surgery it may be brought to your room.  For patients admitted to the hospital, discharge time will be determined by your treatment team.  Patients discharged the day of surgery will not be allowed to drive home.   Special instructions:   [facility]- Preparing For Surgery  Before surgery, you can play an important role. Because skin is not sterile, your skin needs to be as free of germs as possible. You can reduce the number of germs on your skin by washing with CHG (chlorahexidine gluconate) Soap before surgery.  CHG is an antiseptic cleaner which kills germs and bonds with the skin to continue killing germs even after washing.    Oral Hygiene is also important to reduce your risk of infection.  Remember - BRUSH YOUR TEETH THE MORNING OF SURGERY WITH YOUR REGULAR TOOTHPASTE  Please do not use if you have an allergy to CHG or antibacterial soaps. If your skin becomes reddened/irritated stop using the CHG.  Do not shave (including legs and underarms) for at least 48 hours prior to first CHG shower. It is OK to shave your face.  Please follow these instructions carefully.   1. Shower the NIGHT BEFORE SURGERY and the MORNING OF SURGERY with CHG Soap.   2. If you chose to wash your hair, wash your hair first as usual with your normal shampoo.  3. After you shampoo, rinse your hair and body thoroughly to remove the shampoo.  4. Use CHG as you would any other liquid soap. You can apply CHG directly to the skin and wash gently with a scrungie or a clean washcloth.   5. Apply the CHG Soap to your body ONLY FROM THE NECK DOWN.  Do not use on open wounds or open sores. Avoid contact with your eyes, ears, mouth and genitals (private parts). Wash Face and genitals (private parts)  with your normal soap.   6. Wash thoroughly, paying special attention to the area where your surgery will be performed.  7. Thoroughly rinse your body with warm water from the neck down.  8. DO NOT shower/wash with your normal soap after using and rinsing off the CHG Soap.  9. Pat yourself dry with a CLEAN TOWEL.  10. Wear CLEAN PAJAMAS to bed the night before surgery, wear comfortable clothes the morning of surgery  11. Place CLEAN SHEETS on your bed the  night of your first shower and DO NOT SLEEP WITH PETS.  Day of Surgery:  Do not apply any deodorants/lotions. Please shower the morning of surgery with the CHG soap  Please wear clean clothes to the hospital/surgery center.   Remember to brush your teeth WITH YOUR REGULAR TOOTHPASTE.  Please read over the following fact sheets that you were given.

## 2019-02-04 ENCOUNTER — Other Ambulatory Visit: Payer: Self-pay

## 2019-02-04 ENCOUNTER — Encounter (HOSPITAL_COMMUNITY): Payer: Self-pay

## 2019-02-04 ENCOUNTER — Encounter (HOSPITAL_COMMUNITY)
Admission: RE | Admit: 2019-02-04 | Discharge: 2019-02-04 | Disposition: A | Discharge status: Home / Self Care | Payer: Medicare Other | Source: Ambulatory Visit | Attending: General Surgery | Admitting: General Surgery

## 2019-02-04 ENCOUNTER — Other Ambulatory Visit (HOSPITAL_COMMUNITY)
Admission: RE | Admit: 2019-02-04 | Discharge: 2019-02-04 | Disposition: A | Discharge status: Home / Self Care | Payer: Medicare Other | Source: Ambulatory Visit | Attending: General Surgery | Admitting: General Surgery

## 2019-02-04 DIAGNOSIS — Z01818 Encounter for other preprocedural examination: Secondary | ICD-10-CM | POA: Diagnosis present

## 2019-02-04 DIAGNOSIS — R9431 Abnormal electrocardiogram [ECG] [EKG]: Secondary | ICD-10-CM | POA: Insufficient documentation

## 2019-02-04 DIAGNOSIS — Z20828 Contact with and (suspected) exposure to other viral communicable diseases: Secondary | ICD-10-CM | POA: Insufficient documentation

## 2019-02-04 DIAGNOSIS — I10 Essential (primary) hypertension: Secondary | ICD-10-CM | POA: Diagnosis not present

## 2019-02-04 DIAGNOSIS — E119 Type 2 diabetes mellitus without complications: Secondary | ICD-10-CM | POA: Diagnosis not present

## 2019-02-04 HISTORY — DX: Inflammatory liver disease, unspecified: K75.9

## 2019-02-04 LAB — HEMOGLOBIN A1C
Hgb A1c MFr Bld: 7 % — ABNORMAL HIGH (ref 4.8–5.6)
Mean Plasma Glucose: 154.2 mg/dL

## 2019-02-04 LAB — BASIC METABOLIC PANEL
Anion gap: 13 (ref 5–15)
BUN: 27 mg/dL — ABNORMAL HIGH (ref 8–23)
CO2: 23 mmol/L (ref 22–32)
Calcium: 10.3 mg/dL (ref 8.9–10.3)
Chloride: 106 mmol/L (ref 98–111)
Creatinine, Ser: 1.24 mg/dL (ref 0.61–1.24)
GFR calc Af Amer: >60 mL/min (ref >60)
GFR calc non Af Amer: 57 mL/min — ABNORMAL LOW (ref >60)
Glucose, Bld: 121 mg/dL — ABNORMAL HIGH (ref 70–99)
Potassium: 3.9 mmol/L (ref 3.5–5.1)
Sodium: 142 mmol/L (ref 135–145)

## 2019-02-04 LAB — SARS CORONAVIRUS 2 (TAT 6-24 HRS): SARS Coronavirus 2: NEGATIVE

## 2019-02-04 LAB — CBC
HCT: 41.7 % (ref 39.0–52.0)
Hemoglobin: 13.9 g/dL (ref 13.0–17.0)
MCH: 32.9 pg (ref 26.0–34.0)
MCHC: 33.3 g/dL (ref 30.0–36.0)
MCV: 98.6 fL (ref 80.0–100.0)
Platelets: 129 10*3/uL — ABNORMAL LOW (ref 150–400)
RBC: 4.23 MIL/uL (ref 4.22–5.81)
RDW: 14 % (ref 11.5–15.5)
WBC: 12.6 10*3/uL — ABNORMAL HIGH (ref 4.0–10.5)
nRBC: 0 % (ref 0.0–0.2)

## 2019-02-04 LAB — GLUCOSE, CAPILLARY: Glucose-Capillary: 109 mg/dL — ABNORMAL HIGH (ref 70–99)

## 2019-02-04 NOTE — Progress Notes (Addendum)
PCP:  Dr. Cheryle Horsfall, [place], [place] Cardiologist:  Dr. Laveda Norman, [facility]  EKG:  02/04/19 CXR:  denies ECHO:  08/06/18 Stress Test:  01/25/19 Dr. Viann Shove Cardiac Cath:  6-7 years ago per patient  Fasting Blood Sugar-  105-130 Checks Blood Sugar__2_ times a month  Sleep Study:  Denies CPAP: N/A  Blood Thinner Instructions:  Coumadin last dose 02/02/19 ASA: N/A  Anesthesia Review:  Yes, cardiac history and requested cardiac studies and recent stress test  Patient denies shortness of breath, fever, cough, and chest pain at PAT appointment.  Patient verbalized understanding of instructions provided today at the PAT appointment.  Patient asked to review instructions at home and day of surgery.

## 2019-02-04 NOTE — Progress Notes (Signed)
Dr. Donne Hazel [employer] about abnormal lab results.

## 2019-02-05 NOTE — Anesthesia Preprocedure Evaluation (Addendum)
Anesthesia Evaluation  Patient identified by MRN, date of birth, ID band Patient awake    Reviewed: Allergy & Precautions, NPO status , Patient's Chart, lab work & pertinent test results  Airway Mallampati: III  TM Distance: >3 FB Neck ROM: Full    Dental  (+) Missing   Pulmonary asthma , former smoker,    Pulmonary exam normal breath sounds clear to auscultation       Cardiovascular hypertension, Pt. on medications and Pt. on home beta blockers + CAD, + Past MI and +CHF  Normal cardiovascular exam+ dysrhythmias Atrial Fibrillation + Cardiac Defibrillator  Rhythm:Regular Rate:Normal  Bi-VICD  ECG: V-paced, rate 81  Cardiologist:  Dr. Laveda Norman, [facility]   Neuro/Psych Anxiety negative neurological ROS     GI/Hepatic negative GI ROS, (+) Hepatitis -  Endo/Other  diabetes  Renal/GU negative Renal ROS     Musculoskeletal negative musculoskeletal ROS (+)   Abdominal   Peds  Hematology negative hematology ROS (+) thrombocytopenia   Anesthesia Other Findings RIGHT INGUINAL HERNIA  Reproductive/Obstetrics                           Anesthesia Physical Anesthesia Plan  ASA: IV  Anesthesia Plan: General and Regional   Post-op Pain Management: GA combined w/ Regional for post-op pain   Induction: Intravenous  PONV Risk Score and Plan: 3 and Ondansetron, Dexamethasone and Treatment may vary due to age or medical condition  Airway Management Planned: LMA  Additional Equipment:   Intra-op Plan:   Post-operative Plan: Extubation in OR  Informed Consent: I have reviewed the patients History and Physical, chart, labs and discussed the procedure including the risks, benefits and alternatives for the proposed anesthesia with the patient or authorized representative who has indicated his/her understanding and acceptance.     Dental advisory given  Plan Discussed with:  CRNA  Anesthesia Plan Comments: (See recent PAT note by Karoline Caldwell, PA-C 08/31/18. Patient was cleared by cardiology for procedure at that time, procedure delayed due to Covid. In the interim he had an updated nuclear stress test. Per OV note 02/03/19 from his cardiologist Dr. Merla Riches the nuclear stress showed "dilated left ventricle with a thick scar in the anterior apical segment.  This did extend to the inferoapical segment as well.  His ejection fraction was 27%.  There was no ischemia identified.  His warfarin has been held as of today in preparation for surgery scheduled for August 24.  He is denying any symptoms of chest pain or shortness of breath.  He has not had any therapy from his AICD."  Device form sent to pt's clinic in [facility]. [employer] reps notified of surgery date.  )     Anesthesia Quick Evaluation

## 2019-02-08 ENCOUNTER — Encounter (HOSPITAL_COMMUNITY): Payer: Self-pay | Admitting: Certified Registered Nurse Anesthetist

## 2019-02-08 ENCOUNTER — Encounter (HOSPITAL_COMMUNITY)
Admission: RE | Disposition: A | Discharge status: Home / Self Care | Payer: Self-pay | Source: Home / Self Care | Attending: General Surgery

## 2019-02-08 ENCOUNTER — Other Ambulatory Visit: Payer: Self-pay

## 2019-02-08 ENCOUNTER — Ambulatory Visit (HOSPITAL_COMMUNITY): Payer: Medicare Other | Admitting: Anesthesiology

## 2019-02-08 ENCOUNTER — Observation Stay (HOSPITAL_COMMUNITY)
Admission: RE | Admit: 2019-02-08 | Discharge: 2019-02-09 | Disposition: A | Discharge status: Home / Self Care | Payer: Medicare Other | Source: Home / Self Care | Attending: General Surgery | Admitting: General Surgery

## 2019-02-08 ENCOUNTER — Ambulatory Visit (HOSPITAL_COMMUNITY): Payer: Medicare Other | Admitting: Vascular Surgery

## 2019-02-08 DIAGNOSIS — Z951 Presence of aortocoronary bypass graft: Secondary | ICD-10-CM | POA: Insufficient documentation

## 2019-02-08 DIAGNOSIS — Z87891 Personal history of nicotine dependence: Secondary | ICD-10-CM | POA: Insufficient documentation

## 2019-02-08 DIAGNOSIS — Z79899 Other long term (current) drug therapy: Secondary | ICD-10-CM | POA: Insufficient documentation

## 2019-02-08 DIAGNOSIS — E119 Type 2 diabetes mellitus without complications: Secondary | ICD-10-CM | POA: Insufficient documentation

## 2019-02-08 DIAGNOSIS — Z9842 Cataract extraction status, left eye: Secondary | ICD-10-CM | POA: Insufficient documentation

## 2019-02-08 DIAGNOSIS — K4090 Unilateral inguinal hernia, without obstruction or gangrene, not specified as recurrent: Secondary | ICD-10-CM

## 2019-02-08 DIAGNOSIS — I509 Heart failure, unspecified: Secondary | ICD-10-CM | POA: Insufficient documentation

## 2019-02-08 DIAGNOSIS — I2510 Atherosclerotic heart disease of native coronary artery without angina pectoris: Secondary | ICD-10-CM | POA: Insufficient documentation

## 2019-02-08 DIAGNOSIS — D696 Thrombocytopenia, unspecified: Secondary | ICD-10-CM | POA: Insufficient documentation

## 2019-02-08 DIAGNOSIS — Z880 Allergy status to penicillin: Secondary | ICD-10-CM | POA: Insufficient documentation

## 2019-02-08 DIAGNOSIS — I4891 Unspecified atrial fibrillation: Secondary | ICD-10-CM | POA: Insufficient documentation

## 2019-02-08 DIAGNOSIS — E7800 Pure hypercholesterolemia, unspecified: Secondary | ICD-10-CM | POA: Insufficient documentation

## 2019-02-08 DIAGNOSIS — Z9841 Cataract extraction status, right eye: Secondary | ICD-10-CM | POA: Insufficient documentation

## 2019-02-08 DIAGNOSIS — F419 Anxiety disorder, unspecified: Secondary | ICD-10-CM | POA: Insufficient documentation

## 2019-02-08 DIAGNOSIS — I252 Old myocardial infarction: Secondary | ICD-10-CM | POA: Insufficient documentation

## 2019-02-08 DIAGNOSIS — Z8249 Family history of ischemic heart disease and other diseases of the circulatory system: Secondary | ICD-10-CM | POA: Insufficient documentation

## 2019-02-08 DIAGNOSIS — Z7901 Long term (current) use of anticoagulants: Secondary | ICD-10-CM | POA: Insufficient documentation

## 2019-02-08 DIAGNOSIS — Z888 Allergy status to other drugs, medicaments and biological substances status: Secondary | ICD-10-CM | POA: Insufficient documentation

## 2019-02-08 DIAGNOSIS — Z882 Allergy status to sulfonamides status: Secondary | ICD-10-CM | POA: Insufficient documentation

## 2019-02-08 DIAGNOSIS — I110 Hypertensive heart disease with heart failure: Secondary | ICD-10-CM | POA: Insufficient documentation

## 2019-02-08 DIAGNOSIS — Z9049 Acquired absence of other specified parts of digestive tract: Secondary | ICD-10-CM | POA: Insufficient documentation

## 2019-02-08 DIAGNOSIS — Z833 Family history of diabetes mellitus: Secondary | ICD-10-CM | POA: Insufficient documentation

## 2019-02-08 DIAGNOSIS — J45909 Unspecified asthma, uncomplicated: Secondary | ICD-10-CM | POA: Insufficient documentation

## 2019-02-08 HISTORY — PX: INGUINAL HERNIA REPAIR: SHX194

## 2019-02-08 HISTORY — PX: INSERTION OF MESH: SHX5868

## 2019-02-08 LAB — GLUCOSE, CAPILLARY
Glucose-Capillary: 156 mg/dL — ABNORMAL HIGH (ref 70–99)
Glucose-Capillary: 191 mg/dL — ABNORMAL HIGH (ref 70–99)

## 2019-02-08 LAB — PROTIME-INR
INR: 1.3 — ABNORMAL HIGH (ref 0.8–1.2)
Prothrombin Time: 16.3 seconds — ABNORMAL HIGH (ref 11.4–15.2)

## 2019-02-08 SURGERY — REPAIR, HERNIA, INGUINAL, ADULT
Anesthesia: Regional | Site: Inguinal | Laterality: Right

## 2019-02-08 MED ORDER — 0.9 % SODIUM CHLORIDE (POUR BTL) OPTIME
TOPICAL | Status: DC | PRN
  Administered 2019-02-08: 1000 mL

## 2019-02-08 MED ORDER — PROPOFOL 10 MG/ML IV BOLUS
INTRAVENOUS | Status: DC | PRN
  Administered 2019-02-08: 100 mg via INTRAVENOUS

## 2019-02-08 MED ORDER — EZETIMIBE 10 MG PO TABS
10.0000 mg | ORAL_TABLET | Freq: Every day | ORAL | Status: DC
  Filled 2019-02-08: qty 1

## 2019-02-08 MED ORDER — PHENYLEPHRINE 40 MCG/ML (10ML) SYRINGE FOR IV PUSH (FOR BLOOD PRESSURE SUPPORT)
PREFILLED_SYRINGE | INTRAVENOUS | Status: DC | PRN
  Administered 2019-02-08 (×5): 80 ug via INTRAVENOUS

## 2019-02-08 MED ORDER — ACETAMINOPHEN 650 MG RE SUPP: 650.0000 mg | Freq: Four times a day (QID) | RECTAL | Status: DC | PRN

## 2019-02-08 MED ORDER — PROPOFOL 10 MG/ML IV BOLUS
INTRAVENOUS | Status: AC
  Filled 2019-02-08: qty 20

## 2019-02-08 MED ORDER — TRAMADOL HCL 50 MG PO TABS: 50.0000 mg | ORAL_TABLET | Freq: Four times a day (QID) | ORAL | Status: DC | PRN

## 2019-02-08 MED ORDER — BUPIVACAINE HCL (PF) 0.25 % IJ SOLN
INTRAMUSCULAR | Status: AC
  Filled 2019-02-08: qty 30

## 2019-02-08 MED ORDER — GABAPENTIN 100 MG PO CAPS
100.0000 mg | ORAL_CAPSULE | ORAL | Status: AC
  Administered 2019-02-08: 09:00:00 100 mg via ORAL
  Filled 2019-02-08: qty 1

## 2019-02-08 MED ORDER — POLYETHYLENE GLYCOL 3350 17 G PO PACK: 17.0000 g | PACK | Freq: Every day | ORAL | Status: DC | PRN

## 2019-02-08 MED ORDER — FENTANYL CITRATE (PF) 100 MCG/2ML IJ SOLN
INTRAMUSCULAR | Status: AC
  Administered 2019-02-08: 100 ug via INTRAVENOUS
  Filled 2019-02-08: qty 2

## 2019-02-08 MED ORDER — SIMETHICONE 80 MG PO CHEW
40.0000 mg | CHEWABLE_TABLET | Freq: Four times a day (QID) | ORAL | Status: DC | PRN
  Administered 2019-02-08 – 2019-02-09 (×2): 40 mg via ORAL
  Filled 2019-02-08 (×2): qty 1

## 2019-02-08 MED ORDER — OXYCODONE HCL 5 MG PO TABS
5.0000 mg | ORAL_TABLET | ORAL | Status: DC | PRN
  Administered 2019-02-08 – 2019-02-09 (×2): 10 mg via ORAL
  Filled 2019-02-08 (×3): qty 2

## 2019-02-08 MED ORDER — WARFARIN - PHARMACIST DOSING INPATIENT
Freq: Every day | Status: DC
  Administered 2019-02-08: 18:00:00

## 2019-02-08 MED ORDER — STERILE WATER FOR IRRIGATION IR SOLN
Status: DC | PRN
  Administered 2019-02-08: 1000 mL

## 2019-02-08 MED ORDER — NITROGLYCERIN 0.4 MG SL SUBL: 0.4000 mg | SUBLINGUAL_TABLET | SUBLINGUAL | Status: DC | PRN

## 2019-02-08 MED ORDER — ONDANSETRON HCL 4 MG/2ML IJ SOLN
INTRAMUSCULAR | Status: DC | PRN
  Administered 2019-02-08: 4 mg via INTRAVENOUS

## 2019-02-08 MED ORDER — ONDANSETRON HCL 4 MG/2ML IJ SOLN: 4.0000 mg | Freq: Once | INTRAMUSCULAR | Status: DC | PRN

## 2019-02-08 MED ORDER — FENTANYL CITRATE (PF) 100 MCG/2ML IJ SOLN
25.0000 ug | INTRAMUSCULAR | Status: DC | PRN
  Administered 2019-02-08 (×2): 50 ug via INTRAVENOUS

## 2019-02-08 MED ORDER — FUROSEMIDE 80 MG PO TABS
80.0000 mg | ORAL_TABLET | Freq: Two times a day (BID) | ORAL | Status: DC
  Administered 2019-02-08 – 2019-02-09 (×2): 80 mg via ORAL
  Filled 2019-02-08 (×2): qty 1

## 2019-02-08 MED ORDER — EPHEDRINE SULFATE-NACL 50-0.9 MG/10ML-% IV SOSY
PREFILLED_SYRINGE | INTRAVENOUS | Status: DC | PRN
  Administered 2019-02-08 (×2): 5 mg via INTRAVENOUS

## 2019-02-08 MED ORDER — BUPIVACAINE HCL (PF) 0.25 % IJ SOLN
INTRAMUSCULAR | Status: DC | PRN
  Administered 2019-02-08: 3 mL

## 2019-02-08 MED ORDER — ACETAMINOPHEN 325 MG PO TABS: 650.0000 mg | ORAL_TABLET | Freq: Four times a day (QID) | ORAL | Status: DC | PRN

## 2019-02-08 MED ORDER — LACTATED RINGERS IV SOLN
INTRAVENOUS | Status: DC
  Administered 2019-02-08: 09:00:00 via INTRAVENOUS

## 2019-02-08 MED ORDER — ROPIVACAINE HCL 5 MG/ML IJ SOLN
INTRAMUSCULAR | Status: DC | PRN
  Administered 2019-02-08: 30 mL via PERINEURAL

## 2019-02-08 MED ORDER — VITAMIN B-12 1000 MCG PO TABS
2500.0000 ug | ORAL_TABLET | Freq: Every day | ORAL | Status: DC
  Administered 2019-02-09: 2500 ug via ORAL
  Filled 2019-02-08: qty 3

## 2019-02-08 MED ORDER — MORPHINE SULFATE (PF) 2 MG/ML IV SOLN
1.0000 mg | INTRAVENOUS | Status: DC | PRN
  Administered 2019-02-08 – 2019-02-09 (×2): 1 mg via INTRAVENOUS
  Filled 2019-02-08 (×2): qty 1

## 2019-02-08 MED ORDER — BRIMONIDINE TARTRATE-TIMOLOL 0.2-0.5 % OP SOLN: 1.0000 [drp] | Freq: Two times a day (BID) | OPHTHALMIC | Status: DC

## 2019-02-08 MED ORDER — BRIMONIDINE TARTRATE 0.2 % OP SOLN
1.0000 [drp] | Freq: Two times a day (BID) | OPHTHALMIC | Status: DC
  Administered 2019-02-08 – 2019-02-09 (×2): 1 [drp] via OPHTHALMIC
  Filled 2019-02-08 (×2): qty 5

## 2019-02-08 MED ORDER — FENTANYL CITRATE (PF) 100 MCG/2ML IJ SOLN
100.0000 ug | Freq: Once | INTRAMUSCULAR | Status: AC
  Administered 2019-02-08: 09:00:00 100 ug via INTRAVENOUS

## 2019-02-08 MED ORDER — FENTANYL CITRATE (PF) 100 MCG/2ML IJ SOLN
INTRAMUSCULAR | Status: AC
  Filled 2019-02-08: qty 2

## 2019-02-08 MED ORDER — ONDANSETRON 4 MG PO TBDP: 4.0000 mg | ORAL_TABLET | Freq: Four times a day (QID) | ORAL | Status: DC | PRN

## 2019-02-08 MED ORDER — CIPROFLOXACIN IN D5W 400 MG/200ML IV SOLN
400.0000 mg | INTRAVENOUS | Status: AC
  Administered 2019-02-08: 400 mg via INTRAVENOUS
  Filled 2019-02-08: qty 200

## 2019-02-08 MED ORDER — TIMOLOL MALEATE 0.5 % OP SOLN
1.0000 [drp] | Freq: Two times a day (BID) | OPHTHALMIC | Status: DC
  Administered 2019-02-08: 1 [drp] via OPHTHALMIC
  Filled 2019-02-08 (×2): qty 5

## 2019-02-08 MED ORDER — METOPROLOL SUCCINATE ER 25 MG PO TB24
25.0000 mg | ORAL_TABLET | Freq: Every day | ORAL | Status: DC
  Filled 2019-02-08: qty 1

## 2019-02-08 MED ORDER — DEXAMETHASONE SODIUM PHOSPHATE 10 MG/ML IJ SOLN
INTRAMUSCULAR | Status: DC | PRN
  Administered 2019-02-08: 10 mg via INTRAVENOUS

## 2019-02-08 MED ORDER — FENTANYL CITRATE (PF) 250 MCG/5ML IJ SOLN
INTRAMUSCULAR | Status: AC
  Filled 2019-02-08: qty 5

## 2019-02-08 MED ORDER — MIDAZOLAM HCL 2 MG/2ML IJ SOLN
INTRAMUSCULAR | Status: AC
  Filled 2019-02-08: qty 2

## 2019-02-08 MED ORDER — ENSURE PRE-SURGERY PO LIQD
296.0000 mL | Freq: Once | ORAL | Status: DC
  Filled 2019-02-08: qty 296

## 2019-02-08 MED ORDER — ACETAMINOPHEN 500 MG PO TABS
1000.0000 mg | ORAL_TABLET | ORAL | Status: AC
  Administered 2019-02-08: 09:00:00 1000 mg via ORAL
  Filled 2019-02-08: qty 2

## 2019-02-08 MED ORDER — BENAZEPRIL HCL 5 MG PO TABS
40.0000 mg | ORAL_TABLET | Freq: Every day | ORAL | Status: DC
  Filled 2019-02-08 (×2): qty 8

## 2019-02-08 MED ORDER — ENOXAPARIN SODIUM 40 MG/0.4ML ~~LOC~~ SOLN
40.0000 mg | SUBCUTANEOUS | Status: DC
  Administered 2019-02-08: 40 mg via SUBCUTANEOUS
  Filled 2019-02-08: qty 0.4

## 2019-02-08 MED ORDER — WARFARIN SODIUM 7.5 MG PO TABS
7.5000 mg | ORAL_TABLET | Freq: Once | ORAL | Status: AC
  Administered 2019-02-08: 7.5 mg via ORAL
  Filled 2019-02-08: qty 1

## 2019-02-08 MED ORDER — ONDANSETRON HCL 4 MG/2ML IJ SOLN: 4.0000 mg | Freq: Four times a day (QID) | INTRAMUSCULAR | Status: DC | PRN

## 2019-02-08 SURGICAL SUPPLY — 39 items
BLADE CLIPPER SURG (BLADE) ×2 IMPLANT
CANISTER SUCT 3000ML PPV (MISCELLANEOUS) ×2 IMPLANT
CHLORAPREP W/TINT 26 (MISCELLANEOUS) ×3 IMPLANT
COVER SURGICAL LIGHT HANDLE (MISCELLANEOUS) ×3 IMPLANT
DECANTER SPIKE VIAL GLASS SM (MISCELLANEOUS) ×2 IMPLANT
DERMABOND ADVANCED (GAUZE/BANDAGES/DRESSINGS) ×2
DERMABOND ADVANCED .7 DNX12 (GAUZE/BANDAGES/DRESSINGS) ×1 IMPLANT
DRAIN PENROSE 1/2X12 LTX STRL (WOUND CARE) ×2 IMPLANT
DRAPE LAPAROTOMY TRNSV 102X78 (DRAPES) ×3 IMPLANT
ELECT CAUTERY BLADE 6.4 (BLADE) ×3 IMPLANT
ELECT REM PT RETURN 9FT ADLT (ELECTROSURGICAL) ×3
ELECTRODE REM PT RTRN 9FT ADLT (ELECTROSURGICAL) ×1 IMPLANT
GLOVE BIO SURGEON STRL SZ7 (GLOVE) ×3 IMPLANT
GLOVE BIOGEL PI IND STRL 7.5 (GLOVE) ×1 IMPLANT
GLOVE BIOGEL PI INDICATOR 7.5 (GLOVE) ×2
GOWN STRL REUS W/ TWL LRG LVL3 (GOWN DISPOSABLE) ×2 IMPLANT
GOWN STRL REUS W/TWL LRG LVL3 (GOWN DISPOSABLE) ×6
KIT BASIN OR (CUSTOM PROCEDURE TRAY) ×3 IMPLANT
KIT TURNOVER KIT B (KITS) ×3 IMPLANT
MARKER SKIN DUAL TIP RULER LAB (MISCELLANEOUS) ×3 IMPLANT
MESH ULTRAPRO 3X6 7.6X15CM (Mesh General) ×2 IMPLANT
NDL HYPO 25GX1X1/2 BEV (NEEDLE) ×1 IMPLANT
NEEDLE HYPO 25GX1X1/2 BEV (NEEDLE) ×3 IMPLANT
NS IRRIG 1000ML POUR BTL (IV SOLUTION) ×3 IMPLANT
PACK GENERAL/GYN (CUSTOM PROCEDURE TRAY) ×3 IMPLANT
PAD ARMBOARD 7.5X6 YLW CONV (MISCELLANEOUS) ×5 IMPLANT
PENCIL SMOKE EVACUATOR (MISCELLANEOUS) ×3 IMPLANT
SUT MNCRL AB 4-0 PS2 18 (SUTURE) ×3 IMPLANT
SUT VIC AB 2-0 CT1 27 (SUTURE) ×2
SUT VIC AB 2-0 CT1 TAPERPNT 27 (SUTURE) ×1 IMPLANT
SUT VIC AB 2-0 SH 18 (SUTURE) ×3 IMPLANT
SUT VIC AB 2-0 SH 27 (SUTURE) ×2
SUT VIC AB 2-0 SH 27X BRD (SUTURE) IMPLANT
SUT VIC AB 3-0 SH 27 (SUTURE) ×2
SUT VIC AB 3-0 SH 27XBRD (SUTURE) ×1 IMPLANT
SUT VICRYL AB 2 0 TIES (SUTURE) ×3 IMPLANT
SYR CONTROL 10ML LL (SYRINGE) ×3 IMPLANT
TOWEL GREEN STERILE (TOWEL DISPOSABLE) ×3 IMPLANT
WATER STERILE IRR 1000ML POUR (IV SOLUTION) ×2 IMPLANT

## 2019-02-08 NOTE — Discharge Instructions (Signed)
[facility] ° °UMBILICAL OR INGUINAL HERNIA REPAIR: POST OP INSTRUCTIONS ° °Always review your discharge instruction sheet given to you by the facility where your surgery was performed. °IF YOU HAVE DISABILITY OR FAMILY LEAVE FORMS, YOU MUST BRING THEM TO THE OFFICE FOR PROCESSING.   °DO NOT GIVE THEM TO YOUR DOCTOR. ° °1. A  prescription for pain medication may be given to you upon discharge.  Take your pain medication as prescribed, if needed.  If narcotic pain medicine is not needed, then you may take acetaminophen (Tylenol), naprosyn (Alleve) or ibuprofen (Advil) as needed. °2. Take your usually prescribed medications unless otherwise directed. °3. If you need a refill on your pain medication, please contact your pharmacy.  They will contact our office to request authorization. Prescriptions will not be filled after 5 pm or on week-ends. °4. You should follow a light diet the first 24 hours after arrival home, such as soup and crackers, etc.  Be sure to include lots of fluids daily.  Resume your normal diet the day after surgery. °5. Most patients will experience some swelling and bruising around the umbilicus or in the groin and scrotum.  Ice packs and reclining will help.  Swelling and bruising can take several days to resolve.  °6. It is common to experience some constipation if taking pain medication after surgery.  Increasing fluid intake and taking a stool softener (such as Colace) will usually help or prevent this problem from occurring.  A mild laxative (Milk of Magnesia or Miralax) should be taken according to package directions if there are no bowel movements after 48 hours. °7. Unless discharge instructions indicate otherwise, you may remove your bandages 48 hours after surgery, and you may shower at that time.  You may have steri-strips (small skin tapes) in place directly over the incision.  These strips should be left on the skin for 7-10 days and will come off on their own.   If your surgeon used skin glue on the incision, you may shower in 24 hours.  The glue will flake off over the next 2-3 weeks.  Any sutures or staples will be removed at the office during your follow-up visit. °8. ACTIVITIES:  You may resume regular (light) daily activities beginning the next day--such as daily self-care, walking, climbing stairs--gradually increasing activities as tolerated.  You may have sexual intercourse when it is comfortable.  Refrain from any heavy lifting or straining until approved by your doctor. °a. You may drive when you are no longer taking prescription pain medication, you can comfortably wear a seatbelt, and you can safely maneuver your car and apply brakes. °b. RETURN TO WORK:  __________________________________________________________ °9. You should see your doctor in the office for a follow-up appointment approximately 2-3 weeks after your surgery.  Make sure that you call for this appointment within a day or two after you arrive home to insure a convenient appointment time. °10. OTHER INSTRUCTIONS:  __________________________________________________________________________________________________________________________________________________________________________________________  °WHEN TO CALL YOUR DOCTOR: °1. Fever over 101.0 °2. Inability to urinate °3. Nausea and/or vomiting °4. Extreme swelling or bruising °5. Continued bleeding from incision. °6. Increased pain, redness, or drainage from the incision ° °The clinic staff is available to answer your questions during regular business hours.  Please don’t hesitate to call and ask to speak to one of the nurses for clinical concerns.  If you have a medical emergency, go to the nearest emergency room or call 911.  A surgeon from [facility]   is always on call at the hospital ° ° °[address], [address], [place], [place]  [address] ? ° [address] [address], [place], [place]   [address] °[phone] ? [phone] ? FAX  [phone] °Web site: [email or web address] ° ° °

## 2019-02-08 NOTE — Op Note (Signed)
Preoperative diagnosis: right inguinal hernia  Postoperative diagnosis: direct right inguinal hernia Procedure: Right inguinal hernia repair with Ultrapro mesh patch Surgeon: Dr Serita Grammes Anesthesia: general with TAP block EBL: minimal Complications none Drains none Specimens none Sponge and needle count correct at completion dispo to recovery stable  Indications: This is a 29 yom who has a symptomatic right inguinal hernia. He desires repair. I think this is reasonable and have discussed it with cardiology. He has significant cardiac comorbidities  Procedure: After informed consent was obtained patient was taken to the operating room. He had undergone a TAP block.  He was given antibiotics and SCDs were in place.  He was placed under general anesthesia without complication. He was prepped and draped in standard sterile surgical fashion.  I infiltrated marcaine in the right groin.  I made an incision. I ligated the superficial epigastric vein with 3-0 vicryl.  I then entered the external oblique through the external ring.  I encircled the spermatic cord with a penrose drain. There was no indirect hernia.  He had a large direct hernia.  I then closed this primarily with 2-0 vicryl from the internal oblique.  I then fashioned an ultrapro patch to fit the area. I sutured this to the pubic tubercle with 2-0 vicryl and then to the shelving edge inferiorly.  I made a cut and wrapped around spermatic cord. I then sutured the cut ends together and secured the top portion of the mesh. I laid the lateral ends under the external oblique. The mesh was in good position and completely obliterated the defect.  Hemostasis was observed. I closed the external oblique with 2-0 vicryl and Scarpas fascia with 3-0 vicryl. The skin was closed with 4-0 monocryl and dermabond. He tolerated this well, was extubated and transferred to recovery stable.

## 2019-02-08 NOTE — Progress Notes (Signed)
ANTICOAGULATION CONSULT NOTE - Initial Consult  Pharmacy Consult for warfarin Indication: atrial fibrillation  Allergies  Allergen Reactions  . Statins Swelling and Other (See Comments)    "hurt all over"  . Fenofibrate Rash  . Penicillins Rash    Did it involve swelling of the face/tongue/throat, SOB, or low BP? No Did it involve sudden or severe rash/hives, skin peeling, or any reaction on the inside of your mouth or nose? No Did you need to seek medical attention at a hospital or doctor's office? No When did it last happen?30 +years If all above answers are "NO", may proceed with cephalosporin use.   . Sulfonamide Derivatives Rash    Patient Measurements: Height: 6\' 4"  (193 cm) Weight: 190 lb (86.2 kg) IBW/kg (Calculated) : 86.8  Vital Signs: Temp: 97.8 F (36.6 C) (08/24 1200) Temp Source: Oral (08/24 0759) BP: 135/74 (08/24 1200) Pulse Rate: 69 (08/24 1200)  Labs: Recent Labs    02/08/19 0804  LABPROT 16.3*  INR 1.3*    Estimated Creatinine Clearance: 64.7 mL/min (by C-G formula based on SCr of 1.24 mg/dL).   Medical History: Past Medical History:  Diagnosis Date  . Anemia    ? per dr Donne Hazel nurs  . Anterior myocardial infarction ([place]) 1982; 1995  . Anxiety   . Asthma    "when I was a boy"  . Atrial fibrillation ([place])   . 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 Cardiomyopathy ([place])   . CHF (congestive heart failure) ([place])   . Cholecystitis   . Complete heart block ([place])   . Coronary artery disease    with known occlusions of all of his native coronary  . Hepatitis   . High cholesterol   . History of kidney stones   . Hypertension   . Pneumonia    "couple times" (10/08/2013)?  [name] Type II diabetes mellitus ([place])    no rx  . Ventricular tachycardia [facility])    Assessment: 73 yo male with h/o afib on warfarin  PTA admitted for R inguinal hernia repair. Patient now post-op and pharmacy consulted to  restart warfarin. PTA dose is 5mg  daily except 7.5mg  daily on Mondays only. INR today is 1.3. Last INR unknown. Patient follows with cardiologist in [facility] per note in January.   Goal of Therapy:  INR 2-3 Monitor platelets by anticoagulation protocol: Yes   Plan:  Warfarin 7.5mg  PO tonight.  Daily INR Monitor for bleeding  Lenae Wherley A. Levada Dy, PharmD, BCPS, FNKF Clinical Pharmacist [place] Please utilize Amion for appropriate phone number to reach the unit pharmacist ([place])   02/08/2019,12:27 PM

## 2019-02-08 NOTE — Care Management Obs Status (Signed)
[facility] NOTIFICATION   Patient Details  Name: Spencer SKELLEY Sr. MRN: [id number] Date of Birth: March 27, 1946   Medicare Observation Status Notification Given:  Yes    Marilu Favre, RN 02/08/2019, 3:02 PM

## 2019-02-08 NOTE — H&P (Signed)
73 yom I know from complicated lap chole with liver abscess in 2015. he has significant cardiac history. he did well then with this surgery. he is on coumadin. he is overall doing well. he has known rih that has been followed for years. this recently has become larger and more symptomatic. he had an episode where it was incarcerated for about 4 hours and then eventually he reduced it. this is causing discomfort. he would like to consider repair now   Past Surgical History  Cataract Surgery  Bilateral. Coronary Artery Bypass Graft  Gallbladder Surgery - Open   Diagnostic Studies History  Colonoscopy  1-5 years ago  Allergies  Penicillins  Statins Depletion *DIETARY PRODUCTS/DIETARY MANAGEMENT PRODUCTS*  Fenofibrate *CHEMICALS*  Sulfacetamide *CHEMICALS*   Medication History  Benazepril HCl (40MG  Tablet, Oral) Active. Coumadin (5MG  Tablet, Oral) Active. Ezetimibe (10MG  Tablet, Oral) Active. Furosemide (80MG  Tablet, Oral) Active. Metoprolol Succinate ER (25MG  Tablet ER 24HR, Oral) Active. Travatan Z (0.004% Solution, Ophthalmic) Active. Nitroglycerin (0.4MG  Tab Sublingual, Sublingual) Active. CoQ10 (100MG  Capsule, Oral) Active. B12-Active (1MG  Tablet Chewable, Oral) Active. Medications Reconciled  Social History  Alcohol use  Remotely quit alcohol use. Caffeine use  Coffee, Tea. No drug use  Tobacco use  Former smoker.  Family History  Diabetes Mellitus  Father. Heart Disease  Mother. Heart disease in male family member before age 47   Other Problems  Asthma  Atrial Fibrillation  Congestive Heart Failure  Diabetes Mellitus  High blood pressure  Hypercholesterolemia  Myocardial infarction  Other disease, cancer, significant illness  Vascular Disease    Review of Systems  General Not Present- Appetite Loss, Chills, Fatigue, Fever, Night Sweats, Weight Gain and Weight Loss. Skin Not Present- Change in Wart/Mole, Dryness,  Hives, Jaundice, New Lesions, Non-Healing Wounds, Rash and Ulcer. HEENT Present- Ringing in the Ears, Seasonal Allergies, Visual Disturbances and Wears glasses/contact lenses. Not Present- Earache, Hearing Loss, Hoarseness, Nose Bleed, Oral Ulcers, Sinus Pain, Sore Throat and Yellow Eyes. Respiratory Not Present- Bloody sputum, Chronic Cough, Difficulty Breathing, Snoring and Wheezing. Breast Not Present- Breast Mass, Breast Pain, Nipple Discharge and Skin Changes. Cardiovascular Present- Palpitations. Not Present- Chest Pain, Difficulty Breathing Lying Down, Leg Cramps, Rapid Heart Rate, Shortness of Breath and Swelling of Extremities. Gastrointestinal Not Present- Abdominal Pain, Bloating, Bloody Stool, Change in Bowel Habits, Chronic diarrhea, Constipation, Difficulty Swallowing, Excessive gas, Gets full quickly at meals, Hemorrhoids, Indigestion, Nausea, Rectal Pain and Vomiting. Male Genitourinary Present- Impotence. Not Present- Blood in Urine, Change in Urinary Stream, Frequency, Nocturia, Painful Urination, Urgency and Urine Leakage.   Physical Exam  General Mental Status-Alert. Head and Neck Trachea-midline. Eye Sclera/Conjunctiva - Bilateral-No scleral icterus. Chest and Lung Exam Chest and lung exam reveals -quiet, even and easy respiratory effort with no use of accessory muscles and on auscultation, normal breath sounds, no adventitious sounds and normal vocal resonance. Cardiovascular Cardiovascular examination reveals -normal heart sounds, regular rate and rhythm with no murmurs. Abdomen Note: soft nt/nd Neurologic Neurologic evaluation reveals -alert and oriented x 3 with no impairment of recent or remote memory. Lymphatic Head & Neck General Head & Neck Lymphatics: Bilateral - Description - Normal. Axillary General Axillary Region: Bilateral - Description - Normal. Note: no [place] adenopathy   Assessment & Plan  INGUINAL HERNIA OF RIGHT SIDE WITHOUT  OBSTRUCTION OR GANGRENE (K40.90) Story: RIH with mesh  We discussed observation versus repair. We discussed both laparoscopic and open inguinal hernia repairs. I described the procedure in detail. Goals should be achieved with surgery. We  discussed the usage of mesh and the rationale behind that. We went over the pathophysiology of inguinal hernia. We have elected to perform open inguinal hernia repair with mesh. We discussed the risks including bleeding, infection, recurrence, postoperative pain and chronic groin pain, testicular injury, urinary retention, numbness in groin and around incision. will await cardiology evaluation first. this is very symptomatic at this point so I think repair is indicated.

## 2019-02-08 NOTE — Anesthesia Procedure Notes (Signed)
Anesthesia Regional Block: TAP block   Pre-Anesthetic Checklist: ,, timeout performed, Correct Patient, Correct Site, Correct Laterality, Correct Procedure,, site marked, risks and benefits discussed, Surgical consent,  Pre-op evaluation,  At surgeon's request and post-op pain management  Laterality: Right  Prep: chloraprep       Needles:  Injection technique: Single-shot  Needle Type: Echogenic Stimulator Needle     Needle Length: 10cm  Needle Gauge: 21     Additional Needles:   Procedures:,,,, ultrasound used (permanent image in chart),,,,  Narrative:  Start time: 02/08/2019 9:15 AM End time: 02/08/2019 9:25 AM Injection made incrementally with aspirations every 5 mL.  Performed by: Personally   Additional Notes: Functioning IV was confirmed and monitors were applied.  A 179mm 21ga [place] echogenic stimulator needle was used. Sterile prep and drape,hand hygiene and sterile gloves were used.  Negative aspiration and negative test dose prior to incremental administration of local anesthetic. The patient tolerated the procedure well.

## 2019-02-08 NOTE — Transfer of Care (Signed)
Immediate Anesthesia Transfer of Care Note  Patient: [place]  Procedure(s) Performed: RIGHT INGUINAL HERNIA REPAIR WITH MESH (Right Inguinal) Insertion Of Mesh (Right Inguinal)  Patient Location: PACU  Anesthesia Type:General and Regional  Level of Consciousness: patient cooperative and responds to stimulation  Airway & Oxygen Therapy: Patient Spontanous Breathing and Patient connected to nasal cannula oxygen  Post-op Assessment: Report given to RN and Post -op Vital signs reviewed and stable  Post vital signs: Reviewed and stable  Last Vitals:  Vitals Value Taken Time  BP 108/69 02/08/19 1043  Temp    Pulse 70 02/08/19 1046  Resp 21 02/08/19 1046  SpO2 93 % 02/08/19 1046  Vitals shown include unvalidated device data.  Last Pain:  Vitals:   02/08/19 0925  TempSrc:   PainSc: 0-No pain         Complications: No apparent anesthesia complications

## 2019-02-08 NOTE — Anesthesia Postprocedure Evaluation (Signed)
Anesthesia Post Note  Patient: Spencer Shelton  Procedure(s) Performed: RIGHT INGUINAL HERNIA REPAIR WITH MESH (Right Inguinal) Insertion Of Mesh (Right Inguinal)     Patient location during evaluation: PACU Anesthesia Type: Regional and General Level of consciousness: awake and alert Pain management: pain level controlled Vital Signs Assessment: post-procedure vital signs reviewed and stable Respiratory status: spontaneous breathing, nonlabored ventilation, respiratory function stable and patient connected to nasal cannula oxygen Cardiovascular status: blood pressure returned to baseline and stable Postop Assessment: no apparent nausea or vomiting Anesthetic complications: no    Last Vitals:  Vitals:   02/08/19 1200 02/08/19 1227  BP: 135/74 130/67  Pulse: 69 70  Resp: 12 16  Temp: 36.6 C (!) 36.4 C  SpO2: 98% 96%    Last Pain:  Vitals:   02/08/19 1839  TempSrc:   PainSc: 10-Worst pain ever                 Ryan P Ellender

## 2019-02-09 ENCOUNTER — Encounter (HOSPITAL_COMMUNITY): Payer: Self-pay | Admitting: General Surgery

## 2019-02-09 LAB — PROTIME-INR
INR: 1.5 — ABNORMAL HIGH (ref 0.8–1.2)
Prothrombin Time: 17.5 seconds — ABNORMAL HIGH (ref 11.4–15.2)

## 2019-02-09 MED ORDER — WARFARIN SODIUM 5 MG PO TABS: 5.0000 mg | ORAL_TABLET | Freq: Once | ORAL | Status: DC

## 2019-02-09 MED ORDER — OXYCODONE HCL 5 MG PO TABS: 5.0000 mg | ORAL_TABLET | ORAL | 0 refills | Status: DC | PRN

## 2019-02-09 NOTE — Progress Notes (Signed)
ANTICOAGULATION CONSULT NOTE - Follow-up Consult  Pharmacy Consult for warfarin Indication: atrial fibrillation  Allergies  Allergen Reactions  . Statins Swelling and Other (See Comments)    "hurt all over"  . Fenofibrate Rash  . Penicillins Rash    Did it involve swelling of the face/tongue/throat, SOB, or low BP? No Did it involve sudden or severe rash/hives, skin peeling, or any reaction on the inside of your mouth or nose? No Did you need to seek medical attention at a hospital or doctor's office? No When did it last happen?30 +years If all above answers are "NO", may proceed with cephalosporin use.   . Sulfonamide Derivatives Rash    Patient Measurements: Height: 6\' 4"  (193 cm) Weight: 190 lb (86.2 kg) IBW/kg (Calculated) : 86.8  Vital Signs: Temp: 97.8 F (36.6 C) (08/25 0700) Temp Source: Oral (08/25 0700) BP: 119/68 (08/25 0700) Pulse Rate: 77 (08/25 0700)  Labs: Recent Labs    02/08/19 0804 02/09/19 0059  LABPROT 16.3* 17.5*  INR 1.3* 1.5*    Estimated Creatinine Clearance: 64.7 mL/min (by C-G formula based on SCr of 1.24 mg/dL).   Medical History: Past Medical History:  Diagnosis Date  . Anemia    ? per dr Donne Hazel nurs  . Anterior myocardial infarction ([place]) 1982; 1995  . Anxiety   . Asthma    "when I was a boy"  . Atrial fibrillation ([place])   . 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 Cardiomyopathy ([place])   . CHF (congestive heart failure) ([place])   . Cholecystitis   . Complete heart block ([place])   . Coronary artery disease    with known occlusions of all of his native coronary  . Hepatitis   . High cholesterol   . History of kidney stones   . Hypertension   . Pneumonia    "couple times" (10/08/2013)?  [name] Type II diabetes mellitus ([place])    no rx  . Ventricular tachycardia [facility])    Assessment: 73 yo male with h/o afib on warfarin  PTA admitted for R inguinal hernia repair. Patient now post-op and  pharmacy consulted to restart warfarin. PTA dose is 5mg  daily except 7.5mg  daily on Mondays only. Last PTA INR unknown. Patient follows with cardiologist in [facility] per note in January.   INR 1.3>>1.5, no bleeding noted  Goal of Therapy:  INR 2-3 Monitor platelets by anticoagulation protocol: Yes   Plan:  Warfarin 5 mg PO tonight.  Daily INR Monitor for bleeding  Corinna Burkman A. Levada Dy, PharmD, BCPS, FNKF Clinical Pharmacist [place] Please utilize Amion for appropriate phone number to reach the unit pharmacist ([place])   02/09/2019,8:09 AM

## 2019-02-09 NOTE — Progress Notes (Signed)
Patient ID: Spencer Burnet Sr., male   DOB: [birth year], 73 y.o.   MRN: [id number] No issues overnight, no chest pain, right groin swollen, not really hematoma, can dc today and I will see Thursday in offcie

## 2019-02-09 NOTE — Plan of Care (Signed)
  Problem: Education: Goal: Knowledge of General Education information will improve Description: Including pain rating scale, medication(s)/side effects and non-pharmacologic comfort measures Outcome: Completed/Met   Problem: Health Behavior/Discharge Planning: Goal: Ability to manage health-related needs will improve Outcome: Completed/Met   Problem: Clinical Measurements: Goal: Ability to maintain clinical measurements within normal limits will improve Outcome: Completed/Met Goal: Will remain free from infection Outcome: Completed/Met Goal: Diagnostic test results will improve Outcome: Completed/Met Goal: Respiratory complications will improve Outcome: Completed/Met Goal: Cardiovascular complication will be avoided Outcome: Completed/Met   Problem: Activity: Goal: Risk for activity intolerance will decrease Outcome: Completed/Met   Problem: Nutrition: Goal: Adequate nutrition will be maintained Outcome: Completed/Met   Problem: Coping: Goal: Level of anxiety will decrease Outcome: Completed/Met   Problem: Elimination: Goal: Will not experience complications related to bowel motility Outcome: Completed/Met Goal: Will not experience complications related to urinary retention Outcome: Completed/Met   Problem: Pain Managment: Goal: General experience of comfort will improve Outcome: Completed/Met   Problem: Safety: Goal: Ability to remain free from injury will improve Outcome: Completed/Met   Problem: Skin Integrity: Goal: Risk for impaired skin integrity will decrease Outcome: Completed/Met   Problem: Education: Goal: Required Educational Video(s) Outcome: Completed/Met   Problem: Clinical Measurements: Goal: Ability to maintain clinical measurements within normal limits will improve Outcome: Completed/Met Goal: Postoperative complications will be avoided or minimized Outcome: Completed/Met   Problem: Skin Integrity: Goal: Demonstration of wound healing  without infection will improve Outcome: Completed/Met

## 2019-02-10 NOTE — Discharge Summary (Signed)
Physician Discharge Summary  Patient ID: Spencer Shelton MRN: [id number] DOB/AGE: 73-Nov-1947 73 y.o.  Admit date: 02/08/2019 Discharge date: 02/10/2019  Admission Diagnoses: RIH DM History VT CAD afib  Discharge Diagnoses:  Active Problems:   Right inguinal hernia   Discharged Condition: good  Hospital Course: 58 yom with cardiac history has a very symptomatic right groin hernia. I discussed case with cardiology and we elected to proceed with open rih repair. This was completed with mesh.  He did well overnight. He had swelling in right groin, no real hematoma visible the following day but was otherwise well and will be discharged home  Consults: None  Significant Diagnostic Studies: none  Treatments: surgery: rih with mesh   Disposition:    Allergies as of 02/09/2019      Reactions   Statins Swelling, Other (See Comments)   "hurt all over"   Fenofibrate Rash   Penicillins Rash   Did it involve swelling of the face/tongue/throat, SOB, or low BP? No Did it involve sudden or severe rash/hives, skin peeling, or any reaction on the inside of your mouth or nose? No Did you need to seek medical attention at a hospital or doctor's office? No When did it last happen?30 +years If all above answers are "NO", may proceed with cephalosporin use.   Sulfonamide Derivatives Rash      Medication List    TAKE these medications   benazepril 40 MG tablet Commonly known as: LOTENSIN Take 40 mg by mouth daily.   CINNAMON PO Take 1 tablet by mouth 2 (two) times daily.   Combigan 0.2-0.5 % ophthalmic solution Generic drug: brimonidine-timolol Place 1 drop into both eyes 2 (two) times daily.   COQ-10 PO Take 1 tablet by mouth daily.   ezetimibe 10 MG tablet Commonly known as: ZETIA Take 10 mg by mouth daily.   furosemide 80 MG tablet Commonly known as: LASIX TAKE 1 TABLET BY MOUTH TWICE DAILY What changed: when to take this   LIQUID TEARS OP Place 1 drop into  both eyes daily as needed (dry eyes).   metoprolol succinate 25 MG 24 hr tablet Commonly known as: TOPROL-XL Take 25 mg by mouth daily.   multivitamin with minerals tablet Take 1 tablet by mouth daily.   nitroGLYCERIN 0.4 MG SL tablet Commonly known as: NITROSTAT Place 0.4 mg under the tongue every 5 (five) minutes x 3 doses as needed for chest pain.   oxyCODONE 5 MG immediate release tablet Commonly known as: Oxy IR/ROXICODONE Take 1 tablet (5 mg total) by mouth every 4 (four) hours as needed for moderate pain.   polyethylene glycol 17 g packet Commonly known as: MIRALAX / GLYCOLAX Take 17 g by mouth daily as needed (constipation).   Travatan Z 0.004 % Soln ophthalmic solution Generic drug: Travoprost (BAK Free) Place 1 drop into both eyes at bedtime.   Vitamin B-12 2500 MCG Subl Place 2,500 mcg under the tongue daily.   warfarin 5 MG tablet Commonly known as: COUMADIN Take as directed by Coumadin clinic What changed:   how much to take  how to take this  when to take this  additional instructions      Follow-up Information    Rolm Bookbinder, MD Follow up in 2 day(s).   Specialty: General Surgery Contact information: [place] [address] [address] [phone]           Signed: Rolm Bookbinder 02/10/2019, 11:58 AM

## 2019-02-11 ENCOUNTER — Encounter (HOSPITAL_COMMUNITY): Payer: Self-pay | Admitting: *Deleted

## 2019-02-11 ENCOUNTER — Emergency Department (HOSPITAL_COMMUNITY): Payer: Medicare Other

## 2019-02-11 ENCOUNTER — Inpatient Hospital Stay (HOSPITAL_COMMUNITY): Payer: Medicare Other | Admitting: Certified Registered"

## 2019-02-11 ENCOUNTER — Encounter (HOSPITAL_COMMUNITY)
Admission: EM | Disposition: A | Discharge status: Home / Self Care | Payer: Self-pay | Source: Home / Self Care | Attending: General Surgery

## 2019-02-11 ENCOUNTER — Other Ambulatory Visit: Payer: Self-pay

## 2019-02-11 ENCOUNTER — Inpatient Hospital Stay (HOSPITAL_COMMUNITY)
Admission: EM | Admit: 2019-02-11 | Discharge: 2019-02-15 | DRG: 351 | Disposition: A | Discharge status: Home / Self Care | Payer: Medicare Other | Attending: General Surgery | Admitting: General Surgery

## 2019-02-11 DIAGNOSIS — Z961 Presence of intraocular lens: Secondary | ICD-10-CM | POA: Diagnosis present

## 2019-02-11 DIAGNOSIS — Z9842 Cataract extraction status, left eye: Secondary | ICD-10-CM

## 2019-02-11 DIAGNOSIS — Z95810 Presence of automatic (implantable) cardiac defibrillator: Secondary | ICD-10-CM | POA: Diagnosis not present

## 2019-02-11 DIAGNOSIS — R262 Difficulty in walking, not elsewhere classified: Secondary | ICD-10-CM | POA: Diagnosis present

## 2019-02-11 DIAGNOSIS — R42 Dizziness and giddiness: Secondary | ICD-10-CM | POA: Diagnosis present

## 2019-02-11 DIAGNOSIS — I110 Hypertensive heart disease with heart failure: Secondary | ICD-10-CM | POA: Diagnosis present

## 2019-02-11 DIAGNOSIS — Z8249 Family history of ischemic heart disease and other diseases of the circulatory system: Secondary | ICD-10-CM

## 2019-02-11 DIAGNOSIS — Z888 Allergy status to other drugs, medicaments and biological substances status: Secondary | ICD-10-CM

## 2019-02-11 DIAGNOSIS — Y838 Other surgical procedures as the cause of abnormal reaction of the patient, or of later complication, without mention of misadventure at the time of the procedure: Secondary | ICD-10-CM | POA: Diagnosis present

## 2019-02-11 DIAGNOSIS — I2510 Atherosclerotic heart disease of native coronary artery without angina pectoris: Secondary | ICD-10-CM | POA: Diagnosis present

## 2019-02-11 DIAGNOSIS — I420 Dilated cardiomyopathy: Secondary | ICD-10-CM | POA: Diagnosis present

## 2019-02-11 DIAGNOSIS — I442 Atrioventricular block, complete: Secondary | ICD-10-CM | POA: Diagnosis present

## 2019-02-11 DIAGNOSIS — D696 Thrombocytopenia, unspecified: Secondary | ICD-10-CM | POA: Diagnosis present

## 2019-02-11 DIAGNOSIS — I6523 Occlusion and stenosis of bilateral carotid arteries: Secondary | ICD-10-CM | POA: Diagnosis present

## 2019-02-11 DIAGNOSIS — I340 Nonrheumatic mitral (valve) insufficiency: Secondary | ICD-10-CM | POA: Diagnosis present

## 2019-02-11 DIAGNOSIS — D62 Acute posthemorrhagic anemia: Secondary | ICD-10-CM | POA: Diagnosis present

## 2019-02-11 DIAGNOSIS — I480 Paroxysmal atrial fibrillation: Secondary | ICD-10-CM | POA: Diagnosis present

## 2019-02-11 DIAGNOSIS — Z87891 Personal history of nicotine dependence: Secondary | ICD-10-CM

## 2019-02-11 DIAGNOSIS — Z20828 Contact with and (suspected) exposure to other viral communicable diseases: Secondary | ICD-10-CM | POA: Diagnosis present

## 2019-02-11 DIAGNOSIS — E785 Hyperlipidemia, unspecified: Secondary | ICD-10-CM | POA: Diagnosis present

## 2019-02-11 DIAGNOSIS — Z833 Family history of diabetes mellitus: Secondary | ICD-10-CM

## 2019-02-11 DIAGNOSIS — I959 Hypotension, unspecified: Secondary | ICD-10-CM | POA: Diagnosis present

## 2019-02-11 DIAGNOSIS — Y9301 Activity, walking, marching and hiking: Secondary | ICD-10-CM | POA: Diagnosis present

## 2019-02-11 DIAGNOSIS — E119 Type 2 diabetes mellitus without complications: Secondary | ICD-10-CM | POA: Diagnosis present

## 2019-02-11 DIAGNOSIS — K4090 Unilateral inguinal hernia, without obstruction or gangrene, not specified as recurrent: Secondary | ICD-10-CM | POA: Diagnosis present

## 2019-02-11 DIAGNOSIS — D72829 Elevated white blood cell count, unspecified: Secondary | ICD-10-CM | POA: Diagnosis present

## 2019-02-11 DIAGNOSIS — J45909 Unspecified asthma, uncomplicated: Secondary | ICD-10-CM | POA: Diagnosis present

## 2019-02-11 DIAGNOSIS — N179 Acute kidney failure, unspecified: Secondary | ICD-10-CM | POA: Diagnosis present

## 2019-02-11 DIAGNOSIS — Z9841 Cataract extraction status, right eye: Secondary | ICD-10-CM

## 2019-02-11 DIAGNOSIS — L7632 Postprocedural hematoma of skin and subcutaneous tissue following other procedure: Secondary | ICD-10-CM | POA: Diagnosis present

## 2019-02-11 DIAGNOSIS — I255 Ischemic cardiomyopathy: Secondary | ICD-10-CM | POA: Diagnosis present

## 2019-02-11 DIAGNOSIS — Z882 Allergy status to sulfonamides status: Secondary | ICD-10-CM

## 2019-02-11 DIAGNOSIS — L8990 Pressure ulcer of unspecified site, unspecified stage: Secondary | ICD-10-CM | POA: Diagnosis present

## 2019-02-11 DIAGNOSIS — S301XXA Contusion of abdominal wall, initial encounter: Secondary | ICD-10-CM | POA: Diagnosis present

## 2019-02-11 DIAGNOSIS — I5022 Chronic systolic (congestive) heart failure: Secondary | ICD-10-CM

## 2019-02-11 DIAGNOSIS — I482 Chronic atrial fibrillation, unspecified: Secondary | ICD-10-CM | POA: Diagnosis present

## 2019-02-11 DIAGNOSIS — S301XXD Contusion of abdominal wall, subsequent encounter: Secondary | ICD-10-CM | POA: Diagnosis not present

## 2019-02-11 DIAGNOSIS — Z79899 Other long term (current) drug therapy: Secondary | ICD-10-CM

## 2019-02-11 DIAGNOSIS — Z880 Allergy status to penicillin: Secondary | ICD-10-CM

## 2019-02-11 DIAGNOSIS — Z951 Presence of aortocoronary bypass graft: Secondary | ICD-10-CM

## 2019-02-11 DIAGNOSIS — Z87442 Personal history of urinary calculi: Secondary | ICD-10-CM

## 2019-02-11 DIAGNOSIS — T148XXA Other injury of unspecified body region, initial encounter: Secondary | ICD-10-CM | POA: Diagnosis not present

## 2019-02-11 DIAGNOSIS — I4819 Other persistent atrial fibrillation: Secondary | ICD-10-CM | POA: Diagnosis not present

## 2019-02-11 DIAGNOSIS — Z9049 Acquired absence of other specified parts of digestive tract: Secondary | ICD-10-CM

## 2019-02-11 DIAGNOSIS — Z7901 Long term (current) use of anticoagulants: Secondary | ICD-10-CM

## 2019-02-11 DIAGNOSIS — I252 Old myocardial infarction: Secondary | ICD-10-CM

## 2019-02-11 DIAGNOSIS — W1830XA Fall on same level, unspecified, initial encounter: Secondary | ICD-10-CM | POA: Diagnosis present

## 2019-02-11 DIAGNOSIS — I4891 Unspecified atrial fibrillation: Secondary | ICD-10-CM | POA: Diagnosis present

## 2019-02-11 HISTORY — PX: IRRIGATION AND DEBRIDEMENT HEMATOMA: SHX5254

## 2019-02-11 LAB — CBC WITH DIFFERENTIAL/PLATELET
Abs Immature Granulocytes: 0.07 10*3/uL (ref 0.00–0.07)
Basophils Absolute: 0.1 10*3/uL (ref 0.0–0.1)
Basophils Relative: 0 %
Eosinophils Absolute: 0.2 10*3/uL (ref 0.0–0.5)
Eosinophils Relative: 1 %
HCT: 20.3 % — ABNORMAL LOW (ref 39.0–52.0)
Hemoglobin: 6.8 g/dL — CL (ref 13.0–17.0)
Immature Granulocytes: 0 %
Lymphocytes Relative: 13 %
Lymphs Abs: 2.4 10*3/uL (ref 0.7–4.0)
MCH: 33.3 pg (ref 26.0–34.0)
MCHC: 33.5 g/dL (ref 30.0–36.0)
MCV: 99.5 fL (ref 80.0–100.0)
Monocytes Absolute: 3.2 10*3/uL — ABNORMAL HIGH (ref 0.1–1.0)
Monocytes Relative: 17 %
Neutro Abs: 12.6 10*3/uL — ABNORMAL HIGH (ref 1.7–7.7)
Neutrophils Relative %: 69 %
Platelets: 125 10*3/uL — ABNORMAL LOW (ref 150–400)
RBC: 2.04 MIL/uL — ABNORMAL LOW (ref 4.22–5.81)
RDW: 13.9 % (ref 11.5–15.5)
WBC: 18.5 10*3/uL — ABNORMAL HIGH (ref 4.0–10.5)
nRBC: 0 % (ref 0.0–0.2)

## 2019-02-11 LAB — BASIC METABOLIC PANEL
Anion gap: 10 (ref 5–15)
Anion gap: 8 (ref 5–15)
BUN: 51 mg/dL — ABNORMAL HIGH (ref 8–23)
BUN: 44 mg/dL — ABNORMAL HIGH (ref 8–23)
CO2: 23 mmol/L (ref 22–32)
CO2: 26 mmol/L (ref 22–32)
Calcium: 8.8 mg/dL — ABNORMAL LOW (ref 8.9–10.3)
Calcium: 8.9 mg/dL (ref 8.9–10.3)
Chloride: 98 mmol/L (ref 98–111)
Chloride: 102 mmol/L (ref 98–111)
Creatinine, Ser: 2.51 mg/dL — ABNORMAL HIGH (ref 0.61–1.24)
Creatinine, Ser: 1.74 mg/dL — ABNORMAL HIGH (ref 0.61–1.24)
GFR calc Af Amer: 28 mL/min — ABNORMAL LOW (ref >60)
GFR calc Af Amer: 44 mL/min — ABNORMAL LOW (ref >60)
GFR calc non Af Amer: 24 mL/min — ABNORMAL LOW (ref >60)
GFR calc non Af Amer: 38 mL/min — ABNORMAL LOW (ref >60)
Glucose, Bld: 218 mg/dL — ABNORMAL HIGH (ref 70–99)
Glucose, Bld: 190 mg/dL — ABNORMAL HIGH (ref 70–99)
Potassium: 3.8 mmol/L (ref 3.5–5.1)
Potassium: 4.2 mmol/L (ref 3.5–5.1)
Sodium: 131 mmol/L — ABNORMAL LOW (ref 135–145)
Sodium: 136 mmol/L (ref 135–145)

## 2019-02-11 LAB — CBC
HCT: 29.8 % — ABNORMAL LOW (ref 39.0–52.0)
Hemoglobin: 10.3 g/dL — ABNORMAL LOW (ref 13.0–17.0)
MCH: 32.9 pg (ref 26.0–34.0)
MCHC: 34.6 g/dL (ref 30.0–36.0)
MCV: 95.2 fL (ref 80.0–100.0)
Platelets: 112 10*3/uL — ABNORMAL LOW (ref 150–400)
RBC: 3.13 MIL/uL — ABNORMAL LOW (ref 4.22–5.81)
RDW: 13.7 % (ref 11.5–15.5)
WBC: 13.9 10*3/uL — ABNORMAL HIGH (ref 4.0–10.5)
nRBC: 0 % (ref 0.0–0.2)

## 2019-02-11 LAB — PROTIME-INR
INR: 2.2 — ABNORMAL HIGH (ref 0.8–1.2)
INR: 1.5 — ABNORMAL HIGH (ref 0.8–1.2)
Prothrombin Time: 24 seconds — ABNORMAL HIGH (ref 11.4–15.2)
Prothrombin Time: 17.4 seconds — ABNORMAL HIGH (ref 11.4–15.2)

## 2019-02-11 LAB — SARS CORONAVIRUS 2 BY RT PCR (HOSPITAL ORDER, PERFORMED IN ~~LOC~~ HOSPITAL LAB): SARS Coronavirus 2: NEGATIVE

## 2019-02-11 LAB — GLUCOSE, CAPILLARY
Glucose-Capillary: 201 mg/dL — ABNORMAL HIGH (ref 70–99)
Glucose-Capillary: 181 mg/dL — ABNORMAL HIGH (ref 70–99)

## 2019-02-11 LAB — ABO/RH: ABO/RH(D): O POS

## 2019-02-11 LAB — PREPARE RBC (CROSSMATCH)

## 2019-02-11 SURGERY — IRRIGATION AND DEBRIDEMENT HEMATOMA
Anesthesia: General | Site: Groin | Laterality: Right

## 2019-02-11 MED ORDER — ROCURONIUM BROMIDE 10 MG/ML (PF) SYRINGE
PREFILLED_SYRINGE | INTRAVENOUS | Status: AC
  Filled 2019-02-11: qty 20

## 2019-02-11 MED ORDER — STERILE WATER FOR IRRIGATION IR SOLN
Status: DC | PRN
  Administered 2019-02-11: 1000 mL

## 2019-02-11 MED ORDER — SODIUM CHLORIDE 0.9 % IV SOLN: 10.0000 mL/h | Freq: Once | INTRAVENOUS | Status: DC

## 2019-02-11 MED ORDER — SIMETHICONE 80 MG PO CHEW
40.0000 mg | CHEWABLE_TABLET | Freq: Four times a day (QID) | ORAL | Status: DC | PRN
  Administered 2019-02-13 – 2019-02-14 (×2): 40 mg via ORAL
  Filled 2019-02-11 (×2): qty 1

## 2019-02-11 MED ORDER — ACETAMINOPHEN 500 MG PO TABS
1000.0000 mg | ORAL_TABLET | Freq: Four times a day (QID) | ORAL | Status: DC
  Administered 2019-02-11 – 2019-02-15 (×12): 1000 mg via ORAL
  Filled 2019-02-11 (×13): qty 2

## 2019-02-11 MED ORDER — PROTHROMBIN COMPLEX CONC HUMAN 500 UNITS IV KIT
1630.0000 [IU] | PACK | Status: AC
  Administered 2019-02-11: 16:00:00 1630 [IU] via INTRAVENOUS
  Filled 2019-02-11: qty 1130

## 2019-02-11 MED ORDER — DIPHENHYDRAMINE HCL 50 MG/ML IJ SOLN: 12.5000 mg | Freq: Four times a day (QID) | INTRAMUSCULAR | Status: DC | PRN

## 2019-02-11 MED ORDER — DIPHENHYDRAMINE HCL 12.5 MG/5ML PO ELIX: 12.5000 mg | ORAL_SOLUTION | Freq: Four times a day (QID) | ORAL | Status: DC | PRN

## 2019-02-11 MED ORDER — SODIUM CHLORIDE 0.9 % IV SOLN
INTRAVENOUS | Status: DC | PRN
  Administered 2019-02-11: 20:00:00 via INTRAVENOUS

## 2019-02-11 MED ORDER — ONDANSETRON HCL 4 MG/2ML IJ SOLN: 4.0000 mg | Freq: Four times a day (QID) | INTRAMUSCULAR | Status: DC | PRN

## 2019-02-11 MED ORDER — SODIUM CHLORIDE 0.9% IV SOLUTION: Freq: Once | INTRAVENOUS | Status: DC

## 2019-02-11 MED ORDER — CIPROFLOXACIN IN D5W 400 MG/200ML IV SOLN
400.0000 mg | INTRAVENOUS | Status: AC
  Administered 2019-02-11: 19:00:00 400 mg via INTRAVENOUS

## 2019-02-11 MED ORDER — HYDROMORPHONE HCL 1 MG/ML IJ SOLN
0.5000 mg | Freq: Once | INTRAMUSCULAR | Status: AC
  Administered 2019-02-11: 17:00:00 0.5 mg via INTRAVENOUS
  Filled 2019-02-11: qty 1

## 2019-02-11 MED ORDER — TRAMADOL HCL 50 MG PO TABS: 50.0000 mg | ORAL_TABLET | Freq: Four times a day (QID) | ORAL | Status: DC | PRN

## 2019-02-11 MED ORDER — ONDANSETRON HCL 4 MG/2ML IJ SOLN
INTRAMUSCULAR | Status: DC | PRN
  Administered 2019-02-11: 4 mg via INTRAVENOUS

## 2019-02-11 MED ORDER — CIPROFLOXACIN IN D5W 400 MG/200ML IV SOLN
INTRAVENOUS | Status: AC
  Filled 2019-02-11: qty 200

## 2019-02-11 MED ORDER — MEPERIDINE HCL 25 MG/ML IJ SOLN: 6.2500 mg | INTRAMUSCULAR | Status: DC | PRN

## 2019-02-11 MED ORDER — ONDANSETRON HCL 4 MG/2ML IJ SOLN
INTRAMUSCULAR | Status: AC
  Filled 2019-02-11: qty 4

## 2019-02-11 MED ORDER — PHENYLEPHRINE 40 MCG/ML (10ML) SYRINGE FOR IV PUSH (FOR BLOOD PRESSURE SUPPORT)
PREFILLED_SYRINGE | INTRAVENOUS | Status: AC
  Filled 2019-02-11: qty 10

## 2019-02-11 MED ORDER — FENTANYL CITRATE (PF) 100 MCG/2ML IJ SOLN: 25.0000 ug | INTRAMUSCULAR | Status: DC | PRN

## 2019-02-11 MED ORDER — VITAMIN K1 10 MG/ML IJ SOLN: 10.0000 mg | INTRAVENOUS | Status: DC

## 2019-02-11 MED ORDER — POLYETHYLENE GLYCOL 3350 17 G PO PACK
17.0000 g | PACK | Freq: Every day | ORAL | Status: DC | PRN
  Administered 2019-02-15: 17 g via ORAL
  Filled 2019-02-11: qty 1

## 2019-02-11 MED ORDER — SODIUM CHLORIDE 0.9 % IV BOLUS
500.0000 mL | Freq: Once | INTRAVENOUS | Status: AC
  Administered 2019-02-11: 500 mL via INTRAVENOUS

## 2019-02-11 MED ORDER — INSULIN ASPART 100 UNIT/ML ~~LOC~~ SOLN: 0.0000 [IU] | Freq: Every day | SUBCUTANEOUS | Status: DC

## 2019-02-11 MED ORDER — LIDOCAINE 2% (20 MG/ML) 5 ML SYRINGE
INTRAMUSCULAR | Status: DC | PRN
  Administered 2019-02-11: 40 mg via INTRAVENOUS

## 2019-02-11 MED ORDER — ACETAMINOPHEN 160 MG/5ML PO SOLN: 325.0000 mg | Freq: Once | ORAL | Status: DC | PRN

## 2019-02-11 MED ORDER — LATANOPROST 0.005 % OP SOLN
1.0000 [drp] | Freq: Every day | OPHTHALMIC | Status: DC
  Administered 2019-02-12 – 2019-02-14 (×3): 1 [drp] via OPHTHALMIC
  Filled 2019-02-11: qty 2.5

## 2019-02-11 MED ORDER — BRIMONIDINE TARTRATE-TIMOLOL 0.2-0.5 % OP SOLN: 1.0000 [drp] | Freq: Two times a day (BID) | OPHTHALMIC | Status: DC

## 2019-02-11 MED ORDER — POTASSIUM CHLORIDE IN NACL 20-0.9 MEQ/L-% IV SOLN
INTRAVENOUS | Status: DC
  Administered 2019-02-11 – 2019-02-13 (×2): via INTRAVENOUS
  Filled 2019-02-11 (×2): qty 1000

## 2019-02-11 MED ORDER — PHENYLEPHRINE 40 MCG/ML (10ML) SYRINGE FOR IV PUSH (FOR BLOOD PRESSURE SUPPORT)
PREFILLED_SYRINGE | INTRAVENOUS | Status: DC | PRN
  Administered 2019-02-11 (×3): 120 ug via INTRAVENOUS

## 2019-02-11 MED ORDER — ONDANSETRON 4 MG PO TBDP: 4.0000 mg | ORAL_TABLET | Freq: Four times a day (QID) | ORAL | Status: DC | PRN

## 2019-02-11 MED ORDER — BENAZEPRIL HCL 40 MG PO TABS
40.0000 mg | ORAL_TABLET | Freq: Every day | ORAL | Status: DC
  Filled 2019-02-11: qty 1

## 2019-02-11 MED ORDER — DEXAMETHASONE SODIUM PHOSPHATE 10 MG/ML IJ SOLN
INTRAMUSCULAR | Status: AC
  Filled 2019-02-11: qty 1

## 2019-02-11 MED ORDER — PROPOFOL 10 MG/ML IV BOLUS
INTRAVENOUS | Status: DC | PRN
  Administered 2019-02-11: 110 mg via INTRAVENOUS

## 2019-02-11 MED ORDER — 0.9 % SODIUM CHLORIDE (POUR BTL) OPTIME
TOPICAL | Status: DC | PRN
  Administered 2019-02-11 (×2): 1000 mL

## 2019-02-11 MED ORDER — MORPHINE SULFATE (PF) 2 MG/ML IV SOLN: 1.0000 mg | INTRAVENOUS | Status: DC | PRN

## 2019-02-11 MED ORDER — FENTANYL CITRATE (PF) 100 MCG/2ML IJ SOLN
INTRAMUSCULAR | Status: DC | PRN
  Administered 2019-02-11: 50 ug via INTRAVENOUS

## 2019-02-11 MED ORDER — ACETAMINOPHEN 325 MG PO TABS: 325.0000 mg | ORAL_TABLET | Freq: Once | ORAL | Status: DC | PRN

## 2019-02-11 MED ORDER — NITROGLYCERIN 0.4 MG SL SUBL: 0.4000 mg | SUBLINGUAL_TABLET | SUBLINGUAL | Status: DC | PRN

## 2019-02-11 MED ORDER — FENTANYL CITRATE (PF) 250 MCG/5ML IJ SOLN
INTRAMUSCULAR | Status: AC
  Filled 2019-02-11: qty 5

## 2019-02-11 MED ORDER — BRIMONIDINE TARTRATE 0.2 % OP SOLN
1.0000 [drp] | Freq: Two times a day (BID) | OPHTHALMIC | Status: DC
  Administered 2019-02-11 – 2019-02-15 (×8): 1 [drp] via OPHTHALMIC
  Filled 2019-02-11: qty 5

## 2019-02-11 MED ORDER — TIMOLOL MALEATE 0.5 % OP SOLN
1.0000 [drp] | Freq: Two times a day (BID) | OPHTHALMIC | Status: DC
  Administered 2019-02-12 – 2019-02-15 (×7): 1 [drp] via OPHTHALMIC
  Filled 2019-02-11: qty 5

## 2019-02-11 MED ORDER — DOXYCYCLINE HYCLATE 100 MG PO TABS
100.0000 mg | ORAL_TABLET | Freq: Two times a day (BID) | ORAL | Status: DC
  Administered 2019-02-12 – 2019-02-15 (×7): 100 mg via ORAL
  Filled 2019-02-11 (×7): qty 1

## 2019-02-11 MED ORDER — ACETAMINOPHEN 500 MG PO TABS: 1000.0000 mg | ORAL_TABLET | Freq: Four times a day (QID) | ORAL | Status: DC

## 2019-02-11 MED ORDER — LIDOCAINE 2% (20 MG/ML) 5 ML SYRINGE
INTRAMUSCULAR | Status: AC
  Filled 2019-02-11: qty 5

## 2019-02-11 MED ORDER — METOPROLOL SUCCINATE ER 25 MG PO TB24
25.0000 mg | ORAL_TABLET | Freq: Every day | ORAL | Status: DC
  Administered 2019-02-12 – 2019-02-14 (×3): 25 mg via ORAL
  Filled 2019-02-11 (×5): qty 1

## 2019-02-11 MED ORDER — HEMOSTATIC AGENTS (NO CHARGE) OPTIME
TOPICAL | Status: DC | PRN
  Administered 2019-02-11 (×2): 1 via TOPICAL

## 2019-02-11 MED ORDER — EZETIMIBE 10 MG PO TABS
10.0000 mg | ORAL_TABLET | Freq: Every day | ORAL | Status: DC
  Administered 2019-02-12 – 2019-02-15 (×4): 10 mg via ORAL
  Filled 2019-02-11 (×4): qty 1

## 2019-02-11 MED ORDER — LACTATED RINGERS IV SOLN
INTRAVENOUS | Status: DC | PRN
  Administered 2019-02-11: 19:00:00 via INTRAVENOUS

## 2019-02-11 MED ORDER — SUCCINYLCHOLINE CHLORIDE 200 MG/10ML IV SOSY
PREFILLED_SYRINGE | INTRAVENOUS | Status: AC
  Filled 2019-02-11: qty 40

## 2019-02-11 MED ORDER — OXYCODONE HCL 5 MG PO TABS: 5.0000 mg | ORAL_TABLET | ORAL | Status: DC | PRN

## 2019-02-11 MED ORDER — INSULIN ASPART 100 UNIT/ML ~~LOC~~ SOLN: 0.0000 [IU] | Freq: Three times a day (TID) | SUBCUTANEOUS | Status: DC

## 2019-02-11 MED ORDER — SODIUM CHLORIDE 0.9 % IV SOLN
INTRAVENOUS | Status: DC | PRN
  Administered 2019-02-11: 25 ug/min via INTRAVENOUS

## 2019-02-11 MED ORDER — PROPOFOL 10 MG/ML IV BOLUS
INTRAVENOUS | Status: AC
  Filled 2019-02-11: qty 20

## 2019-02-11 MED ORDER — DOCUSATE SODIUM 100 MG PO CAPS
100.0000 mg | ORAL_CAPSULE | Freq: Two times a day (BID) | ORAL | Status: DC
  Administered 2019-02-11 – 2019-02-15 (×8): 100 mg via ORAL
  Filled 2019-02-11 (×9): qty 1

## 2019-02-11 MED ORDER — LACTATED RINGERS IV SOLN: INTRAVENOUS | Status: DC

## 2019-02-11 MED ORDER — PROMETHAZINE HCL 25 MG/ML IJ SOLN: 6.2500 mg | INTRAMUSCULAR | Status: DC | PRN

## 2019-02-11 MED ORDER — INSULIN ASPART 100 UNIT/ML ~~LOC~~ SOLN
0.0000 [IU] | Freq: Three times a day (TID) | SUBCUTANEOUS | Status: DC
  Administered 2019-02-12: 3 [IU] via SUBCUTANEOUS
  Administered 2019-02-12: 2 [IU] via SUBCUTANEOUS
  Administered 2019-02-12: 18:00:00 3 [IU] via SUBCUTANEOUS
  Administered 2019-02-13: 08:00:00 2 [IU] via SUBCUTANEOUS
  Administered 2019-02-13 (×2): 3 [IU] via SUBCUTANEOUS
  Administered 2019-02-14 (×2): 2 [IU] via SUBCUTANEOUS
  Administered 2019-02-14: 3 [IU] via SUBCUTANEOUS
  Administered 2019-02-15: 2 [IU] via SUBCUTANEOUS

## 2019-02-11 MED ORDER — ACETAMINOPHEN 10 MG/ML IV SOLN: 1000.0000 mg | Freq: Once | INTRAVENOUS | Status: DC | PRN

## 2019-02-11 MED ORDER — SODIUM CHLORIDE 0.9 % IV SOLN
INTRAVENOUS | Status: DC
  Administered 2019-02-11: 17:00:00 via INTRAVENOUS
  Filled 2019-02-11 (×3): qty 1000

## 2019-02-11 MED ORDER — SODIUM CHLORIDE 0.9 % IV SOLN
INTRAVENOUS | Status: DC
  Administered 2019-02-11: 21:00:00 via INTRAVENOUS

## 2019-02-11 SURGICAL SUPPLY — 35 items
BNDG GAUZE ELAST 4 BULKY (GAUZE/BANDAGES/DRESSINGS) IMPLANT
CANISTER SUCT 3000ML PPV (MISCELLANEOUS) ×1 IMPLANT
CLOSURE WOUND 1/2 X4 (GAUZE/BANDAGES/DRESSINGS) ×1
COVER SURGICAL LIGHT HANDLE (MISCELLANEOUS) ×3 IMPLANT
COVER WAND RF STERILE (DRAPES) ×1 IMPLANT
DERMABOND ADVANCED (GAUZE/BANDAGES/DRESSINGS) ×2
DERMABOND ADVANCED .7 DNX12 (GAUZE/BANDAGES/DRESSINGS) IMPLANT
DRAPE LAPAROTOMY 100X72 PEDS (DRAPES) ×2 IMPLANT
DRSG PAD ABDOMINAL 8X10 ST (GAUZE/BANDAGES/DRESSINGS) IMPLANT
ELECT CAUTERY BLADE 6.4 (BLADE) ×3 IMPLANT
ELECT REM PT RETURN 9FT ADLT (ELECTROSURGICAL) ×3
ELECTRODE REM PT RTRN 9FT ADLT (ELECTROSURGICAL) ×1 IMPLANT
GAUZE SPONGE 4X4 12PLY STRL (GAUZE/BANDAGES/DRESSINGS) IMPLANT
GLOVE BIO SURGEON STRL SZ7 (GLOVE) ×3 IMPLANT
GLOVE BIOGEL PI IND STRL 6.5 (GLOVE) IMPLANT
GLOVE BIOGEL PI IND STRL 7.5 (GLOVE) ×1 IMPLANT
GLOVE BIOGEL PI INDICATOR 6.5 (GLOVE) ×4
GLOVE BIOGEL PI INDICATOR 7.5 (GLOVE) ×2
GOWN STRL REUS W/ TWL LRG LVL3 (GOWN DISPOSABLE) ×2 IMPLANT
GOWN STRL REUS W/TWL LRG LVL3 (GOWN DISPOSABLE) ×4
KIT BASIN OR (CUSTOM PROCEDURE TRAY) ×3 IMPLANT
KIT TURNOVER KIT B (KITS) ×3 IMPLANT
MANIFOLD NEPTUNE II (INSTRUMENTS) ×2 IMPLANT
NS IRRIG 1000ML POUR BTL (IV SOLUTION) ×3 IMPLANT
PACK GENERAL/GYN (CUSTOM PROCEDURE TRAY) ×3 IMPLANT
PAD ARMBOARD 7.5X6 YLW CONV (MISCELLANEOUS) ×3 IMPLANT
PENCIL SMOKE EVACUATOR (MISCELLANEOUS) ×3 IMPLANT
STRIP CLOSURE SKIN 1/2X4 (GAUZE/BANDAGES/DRESSINGS) ×1 IMPLANT
SUT MNCRL AB 4-0 PS2 18 (SUTURE) ×2 IMPLANT
SUT VIC AB 2-0 SH 27 (SUTURE) ×2
SUT VIC AB 2-0 SH 27X BRD (SUTURE) IMPLANT
SUT VIC AB 3-0 SH 27 (SUTURE) ×4
SUT VIC AB 3-0 SH 27XBRD (SUTURE) IMPLANT
TOWEL GREEN STERILE (TOWEL DISPOSABLE) ×1 IMPLANT
TOWEL GREEN STERILE FF (TOWEL DISPOSABLE) ×3 IMPLANT

## 2019-02-11 NOTE — Anesthesia Preprocedure Evaluation (Signed)
Anesthesia Evaluation  Patient identified by MRN, date of birth, ID band Patient awake    Reviewed: Allergy & Precautions, NPO status , Patient's Chart, lab work & pertinent test results  Airway Mallampati: II  TM Distance: >3 FB Neck ROM: Full    Dental  (+) Missing, Poor Dentition, Chipped   Pulmonary asthma , former smoker,     + decreased breath sounds      Cardiovascular hypertension, Pt. on medications and Pt. on home beta blockers + CAD, + CABG and +CHF  + dysrhythmias Atrial Fibrillation + Cardiac Defibrillator  Rhythm:Regular Rate:Normal     Neuro/Psych Anxiety negative neurological ROS     GI/Hepatic negative GI ROS, (+) Hepatitis -  Endo/Other  diabetes, Type 2, Oral Hypoglycemic Agents  Renal/GU      Musculoskeletal   Abdominal Normal abdominal exam  (+)   Peds  Hematology  (+) anemia ,   Anesthesia Other Findings   Reproductive/Obstetrics                             Anesthesia Physical Anesthesia Plan  ASA: IV  Anesthesia Plan: General   Post-op Pain Management:    Induction: Intravenous, Rapid sequence and Cricoid pressure planned  PONV Risk Score and Plan: 3 and Ondansetron and Treatment may vary due to age or medical condition  Airway Management Planned: Oral ETT  Additional Equipment: None  Intra-op Plan:   Post-operative Plan: Extubation in OR  Informed Consent: I have reviewed the patients History and Physical, chart, labs and discussed the procedure including the risks, benefits and alternatives for the proposed anesthesia with the patient or authorized representative who has indicated his/her understanding and acceptance.     Dental advisory given  Plan Discussed with:   Anesthesia Plan Comments: (COVID-19 Labs  No results for input(s): DDIMER, FERRITIN, LDH, CRP in the last 72 hours.  Lab Results      Component                Value                Date                      SARSCOV2NAA              NEGATIVE            02/11/2019                [place]              NEGATIVE            02/04/2019            )        Anesthesia Quick Evaluation

## 2019-02-11 NOTE — Progress Notes (Signed)
  Dr Donne Hazel called regarding cardiology issues.  In the setting of surgery, anemia and elevated creatinine, cardiology meds have been held.   He requests we see the patient in am, to manage these issues.   Rosaria Ferries, PA-C 02/11/2019 9:00 PM Beeper [phone]

## 2019-02-11 NOTE — ED Triage Notes (Signed)
Pt reports he has had dizziness since his hernia surgery . Pt denies N/N/D . Incision  located in Rt groin is dry and intact.

## 2019-02-11 NOTE — H&P (Signed)
Spencer BROTHERS Sr. 10-22-1945  [id number].    Chief Complaint/Reason for Consult: right groin hematoma  HPI:  This is a 73 yo white male with multiple medical problems who is known to Dr. Dwain Sarna as he just underwent a right inguinal hernia repair on Monday 02/08/19.  He was discharged on 02/09/19 doing well.  His coumadin was restarted.  He feel due to dizziness when walking up his steps to his camper upon arrival home. The wife states this was because he was given 10mg  of oxy prior to discharge.  Since being at home he has been eating some but not drinking.  His urine is very dark.  Since being home he keeps getting weaker and more dizzy.  The wife states that he began bruising over his incision the day he got home.  It has progressed.  His bruise now expands to his right hip and now up his right flank.  The wife states the right flank is new today and was not that significant yesterday.  He presented today to the office to se Dr. Dwain Sarna but was too dizzy and weak to get in from the car.  He was [facility] to the [facility] where I have seen him.  His hgb is 6.8.  His cr is 2.5.  CXR is normal and head CT is normal as well.  INR is 2.2.  ROS: ROS: Please see HPI, otherwise systems reviewed and are negative.  History reviewed. No pertinent family history.  Past Medical History:  Diagnosis Date  . Anemia    ? per dr Dwain Sarna nurs  . Anterior myocardial infarction (HCC) 1982; 1995  . Anxiety   . Asthma    "when I was a boy"  . Atrial fibrillation (HCC)   . 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 Cardiomyopathy (HCC)   . CHF (congestive heart failure) (HCC)   . Cholecystitis   . Complete heart block (HCC)   . Coronary artery disease    with known occlusions of all of his native coronary  . Hepatitis   . High cholesterol   . History of kidney stones   . Hypertension   . Pneumonia    "couple times" (10/08/2013)?  [name] Type II diabetes mellitus  (HCC)    no rx  . Ventricular tachycardia [facility])     Past Surgical History:  Procedure Laterality Date  . BI-VENTRICULAR IMPLANTABLE CARDIOVERTER DEFIBRILLATOR  (CRT-D)  02/2008; 10/08/2013   MDT CRTD implanted by Dr Ladona Ridgel for primary prevention and CHF 2009 with 6949 lead; 09/2013 - gen change and RV lead revision by Dr Ladona Ridgel  . BIV ICD GENERTAOR CHANGE OUT N/A 10/08/2013   Procedure: BIV ICD GENERTAOR CHANGE OUT;  Surgeon: Marinus Maw, MD;  Location: [facility] CATH LAB;  Service: Cardiovascular;  Laterality: N/A;  . CARDIAC CATHETERIZATION     "several"  . CATARACT EXTRACTION W/ INTRAOCULAR LENS  IMPLANT, BILATERAL Bilateral ?2007  . CHOLECYSTECTOMY N/A 12/22/2013   Procedure: LAPAROSCOPIC CHOLECYSTECTOMY, POSSIBLE OPEN;  Surgeon: Emelia Loron, MD;  Location: [facility] OR;  Service: General;  Laterality: N/A;  . CORONARY ARTERY BYPASS GRAFT  1982/1996   "CABG X5; CABG X4"   . EYE SURGERY Bilateral    cataract  . gallbladder drain  5/15  . INGUINAL HERNIA REPAIR Right 02/08/2019   Procedure: RIGHT INGUINAL HERNIA REPAIR WITH MESH;  Surgeon: Emelia Loron, MD;  Location[facility] OR;  Service: General;  Laterality: Right;  TAP  BLOCK  . INSERTION OF MESH Right 02/08/2019   Procedure: Insertion Of Mesh;  Surgeon: Emelia LoronWakefield, Matthew, MD;  [facility];  Service: General;  Laterality: Right;  . LEAD REVISION N/A 10/08/2013   Procedure: LEAD REVISION;  Surgeon: Marinus MawGregg W Taylor, MD;  [facility];  Service: Cardiovascular;  Laterality: N/A;    Social History:  reports that he quit smoking about 45 years ago. His smoking use included cigarettes. He has a 45.00 pack-year smoking history. He has never used smokeless tobacco. He reports that he does not drink alcohol or use drugs.  Allergies:  Allergies  Allergen Reactions  . Statins Swelling and Other (See Comments)    "hurt all over"  . Fenofibrate Rash  . Penicillins Rash    Did it involve swelling of the face/tongue/throat, SOB, or low  BP? No Did it involve sudden or severe rash/hives, skin peeling, or any reaction on the inside of your mouth or nose? No Did you need to seek medical attention at a hospital or doctor's office? No When did it last happen?30 +years If all above answers are "NO", may proceed with cephalosporin use.   . Sulfonamide Derivatives Rash    (Not in a hospital admission)    Physical Exam: Blood pressure (!) 92/46, pulse 70, temperature 97.8 F (36.6 C), temperature source Oral, resp. rate 19, height 6\' 4"  (1.93 m), weight 86.2 kg, SpO2 100 %. General: pleasant, WD, WN white male who is laying in bed in NAD HEENT: head is normocephalic, atraumatic, except small abrasion just under his left eye.  Sclera are noninjected.  PERRL.  Ears and nose without any masses or lesions.  Mouth is pink and dry Heart: regular, rate, and rhythm, but paced.  Normal s1,s2. No obvious murmurs, gallops, or rubs noted.  Palpable radial and pedal pulses bilaterally.  Pacemaker in chest Lungs: CTAB, no wheezes, rhonchi, or rales noted.  Respiratory effort nonlabored Abd: soft, NT, ND, +BS, no masses, hernias, or organomegaly.  However in his right groin his incision is somewhat separated at the medial aspect with some serosang drainage noted.  He has extensive ecchymosis around this incision tracking to his scrotum which is black (from bruising, not dead), lateral towards his hip and then cephalad up his right flank.  Anasarca and edema are present throughout these areas.  Mildly tender to palpation MS: all 4 extremities are symmetrical with no cyanosis, clubbing, or edema.  Some ecchymosis noted on his left forearm (from fall)  Skin: warm and dry with no masses, lesions, or rashes Psych: A&Ox3 with an appropriate affect.   Results for orders placed or performed during the hospital encounter of 02/11/19 (from the past 48 hour(s))  CBC with Differential     Status: Abnormal   Collection Time: 02/11/19 12:50 PM  Result  Value Ref Range   WBC 18.5 (H) 4.0 - 10.5 K/uL   RBC 2.04 (L) 4.22 - 5.81 MIL/uL   Hemoglobin 6.8 (LL) 13.0 - 17.0 g/dL    Comment: REPEATED TO VERIFY THIS CRITICAL RESULT HAS VERIFIED AND BEEN CALLED TO AUDREY MCKEOWN,RN BY ZELDA BEECH ON 08 27 2020 AT 1449, AND HAS BEEN READ BACK.     HCT 20.3 (L) 39.0 - 52.0 %   MCV 99.5 80.0 - 100.0 fL   MCH 33.3 26.0 - 34.0 pg   MCHC 33.5 30.0 - 36.0 g/dL   RDW 78.213.9 95.611.5 - 21.315.5 %   Platelets 125 (L) 150 - 400 K/uL  Comment: Immature Platelet Fraction may be clinically indicated, consider ordering this additional test [id number]    nRBC 0.0 0.0 - 0.2 %   Neutrophils Relative % 69 %   Neutro Abs 12.6 (H) 1.7 - 7.7 K/uL   Lymphocytes Relative 13 %   Lymphs Abs 2.4 0.7 - 4.0 K/uL   Monocytes Relative 17 %   Monocytes Absolute 3.2 (H) 0.1 - 1.0 K/uL   Eosinophils Relative 1 %   Eosinophils Absolute 0.2 0.0 - 0.5 K/uL   Basophils Relative 0 %   Basophils Absolute 0.1 0.0 - 0.1 K/uL   Immature Granulocytes 0 %   Abs Immature Granulocytes 0.07 0.00 - 0.07 K/uL    Comment: Performed at [facility], [address], [place], [place] [address]  Basic metabolic panel     Status: Abnormal   Collection Time: 02/11/19 12:50 PM  Result Value Ref Range   Sodium 131 (L) 135 - 145 mmol/L   Potassium 3.8 3.5 - 5.1 mmol/L   Chloride 98 98 - 111 mmol/L   CO2 23 22 - 32 mmol/L   Glucose, Bld 218 (H) 70 - 99 mg/dL   BUN 51 (H) 8 - 23 mg/dL   Creatinine, Ser 2.51 (H) 0.61 - 1.24 mg/dL   Calcium 8.8 (L) 8.9 - 10.3 mg/dL   GFR calc non Af Amer 24 (L) >60 mL/min   GFR calc Af Amer 28 (L) >60 mL/min   Anion gap 10 5 - 15    Comment: Performed at [facility] [address] [place], [place] [address]  Protime-INR     Status: Abnormal   Collection Time: 02/11/19 12:50 PM  Result Value Ref Range   Prothrombin Time 24.0 (H) 11.4 - 15.2 seconds   INR 2.2 (H) 0.8 - 1.2    Comment: (NOTE) INR goal varies based on device and disease states.  Performed at [facility], [place] [address], [place], [place] [address]    Dg Chest 2 View  Result Date: 02/11/2019 CLINICAL DATA:  Dizziness.  Recent hernia surgery. 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 LEFT lung and pleuroparenchymal scarring again noted. There is no evidence of focal airspace disease, pulmonary edema, suspicious pulmonary nodule/mass, pleural effusion, or pneumothorax. No acute bony abnormalities are identified. IMPRESSION: No evidence of acute cardiopulmonary disease. Electronically Signed   By: Margarette Canada M.D.   On: 02/11/2019 13:22   Ct Head Wo Contrast  Result Date: 02/11/2019 CLINICAL DATA:  Head injury. EXAM: CT HEAD WITHOUT CONTRAST TECHNIQUE: Contiguous axial images were obtained from the base of the skull through the vertex without intravenous contrast. COMPARISON:  None. FINDINGS: Brain: Mild diffuse cortical atrophy is noted. Mild chronic ischemic white matter disease is noted. No mass effect or midline shift is noted. Ventricular size is within normal limits. There is no evidence of mass lesion, hemorrhage or acute infarction. Vascular: No hyperdense vessel or unexpected calcification. Skull: Normal. Negative for fracture or focal lesion. Sinuses/Orbits: No acute finding. Other: None. IMPRESSION: Mild diffuse cortical atrophy. Mild chronic ischemic white matter disease. No acute intracranial abnormality seen. Electronically Signed   By: Marijo Conception M.D.   On: 02/11/2019 14:28      Assessment/Plan A fib - on coumadin, this is being held and he is being given Kcentra ABL anemia - hgb 6.8, 2 units of pRBCs being given ARF - Cr 2.5 was 1.24 one  week ago CHF - IVFs 100cc/hr to not fluid over load, but to give him some resuscitation given ARF CAD DM - SSI HTN, currently with hypotension secondary to blood loss H/O MI H/O complete heart block - s/p placement of  pacer/AICD  Right groin, hip, and flank hematoma, s/p Right inguinal hernia repair on 02/08/19 by Dr. Dwain SarnaWakefield The patient has a large hematoma s/p right inguinal hernia repair and reinitiation of his coumadin.  He will get Kcentra to reverse his INR as well as 2 units of pRBCs for his hgb of 6.8.  I have discussed all of this with Dr. Dwain SarnaWakefield.  He would like to take him back to the OR tonight to wash this out and make sure he does not have any further active bleeding and if so to correct the problem.  I have discussed all of this with the patient and his wife who is present in the room.  They are in agreement with this plan.  He will be given cipro on call to the OR.  Suspect his leukocytosis is secondary to inflammatory response and not infection as he has no evidence of infection or cellulitis around any of these areas.  FEN - NPO for OR VTE - on hold ID - Cipro on call to OR  Letha CapeKelly E Chace Klippel, [facility] 02/11/2019, 3:35 PM Pager: [phone]

## 2019-02-11 NOTE — ED Provider Notes (Signed)
[facility] EMERGENCY DEPARTMENT Provider Note   CSN: [id number] Arrival date & time: 02/11/19  1147     History   Chief Complaint Chief Complaint  Patient presents with  . Dizziness    HPI Varun Fowlks. is a 73 y.o. male past medical history significant for chronic systolic heart failure, complete heart block A. fib s/p BiV ICD implant on warfarin presents to emergency department today with chief complaint of dizziness.  Patient is POD 3 of right inguinal hernia repair with mesh with Dr. Dwain Sarna.  He states he was discharged the next day right before leaving he was given 2 Percocet.  He took these on empty stomach.  When he got home and was getting out of the car he felt that headed.  He states while walking to his house he was so lightheaded that he fell.  He landed on his left side and hit left side of his head.  He denies LOC.  He was able to get up with assistance.  He states since the surgery has been spent most of his time in bed.  He has had little fluid intake.  He states when he changes position he feels very lightheaded. He admits to associated nausea without emesis. Patient does state that he has had scrotal swelling since the surgery. Spouse reports Dr. Dwain Sarna is aware and recommended icing the area. Pt reports swelling has significantly improved. He has taken his daily medications but no other medications for his symptoms. He denies fever, chills, visual changes, neck pain.  History provided by patient with additional history obtained from chart review.     Past Medical History:  Diagnosis Date  . Anemia    ? per dr Dwain Sarna nurs  . Anterior myocardial infarction (HCC) 1982; 1995  . Anxiety   . Asthma    "when I was a boy"  . Atrial fibrillation (HCC)   . 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 Cardiomyopathy (HCC)   . CHF (congestive heart failure) (HCC)   . Cholecystitis   . Complete heart block (HCC)    . Coronary artery disease    with known occlusions of all of his native coronary  . Hepatitis   . High cholesterol   . History of kidney stones   . Hypertension   . Pneumonia    "couple times" (10/08/2013)?  [name] Type II diabetes mellitus (HCC)    no rx  . Ventricular tachycardia [facility])     Patient Active Problem List   Diagnosis Date Noted  . Hematoma 02/11/2019  . Right inguinal hernia 02/08/2019  . Ascites leaking out umbilical incision 12/30/2013  . Calculus of gallbladder with acute cholecystitis s/p lap chole 12/22/2013 12/22/2013  . CAD (coronary artery disease), native coronary artery 10/26/2013  . Hyperlipidemia   . Cirrhosis (HCC)   . Ventricular tachycardia (paroxysmal) (HCC) 09/16/2013  . Biventricular implantable cardioverter-defibrillator in situ 04/16/2011  . Essential hypertension, benign 03/28/2010  . Atrial fibrillation (HCC) 03/28/2010  . Chronic systolic heart failure (HCC) 03/28/2010    Past Surgical History:  Procedure Laterality Date  . BI-VENTRICULAR IMPLANTABLE CARDIOVERTER DEFIBRILLATOR  (CRT-D)  02/2008; 10/08/2013   MDT CRTD implanted by Dr Ladona Ridgel for primary prevention and CHF 2009 with 6949 lead; 09/2013 - gen change and RV lead revision by Dr Ladona Ridgel  . BIV ICD GENERTAOR CHANGE OUT N/A 10/08/2013   Procedure: BIV ICD GENERTAOR CHANGE OUT;  Surgeon: Marinus Maw, MD;  Location: [facility] CATH LAB;  Service: Cardiovascular;  Laterality: N/A;  . CARDIAC CATHETERIZATION     "several"  . CATARACT EXTRACTION W/ INTRAOCULAR LENS  IMPLANT, BILATERAL Bilateral ?2007  . CHOLECYSTECTOMY N/A 12/22/2013   Procedure: LAPAROSCOPIC CHOLECYSTECTOMY, POSSIBLE OPEN;  Surgeon: Emelia LoronMatthew Wakefield, MD;  Location: [facility] OR;  Service: General;  Laterality: N/A;  . CORONARY ARTERY BYPASS GRAFT  1982/1996   "CABG X5; CABG X4"   . EYE SURGERY Bilateral    cataract  . gallbladder drain  5/15  . INGUINAL HERNIA REPAIR Right 02/08/2019   Procedure: RIGHT INGUINAL HERNIA REPAIR WITH MESH;   Surgeon: Emelia LoronWakefield, Matthew, MD;  [facility];  Service: General;  Laterality: Right;  TAP BLOCK  . INSERTION OF MESH Right 02/08/2019   Procedure: Insertion Of Mesh;  Surgeon: Emelia LoronWakefield, Matthew, MD;  [facility];  Service: General;  Laterality: Right;  . LEAD REVISION N/A 10/08/2013   Procedure: LEAD REVISION;  Surgeon: Marinus MawGregg W Taylor, MD;  [facility];  Service: Cardiovascular;  Laterality: N/A;        Home Medications    Prior to Admission medications   Medication Sig Start Date End Date Taking? Authorizing Provider  benazepril (LOTENSIN) 40 MG tablet Take 40 mg by mouth daily.    Yes [provider]  CINNAMON PO Take 1 tablet by mouth 2 (two) times daily.   Yes [provider]  Coenzyme Q10 (COQ-10 PO) Take 1 tablet by mouth daily.   Yes [provider]  COMBIGAN 0.2-0.5 % ophthalmic solution Place 1 drop into both eyes 2 (two) times daily. 07/15/18  Yes [provider]  Cyanocobalamin (VITAMIN B-12) 2500 MCG SUBL Place 2,500 mcg under the tongue daily.    Yes [provider]  ezetimibe (ZETIA) 10 MG tablet Take 10 mg by mouth daily.     Yes [provider]  furosemide (LASIX) 80 MG tablet TAKE 1 TABLET BY MOUTH TWICE DAILY Patient taking differently: Take 80 mg by mouth 2 (two) times daily.  05/20/18  Yes Baldo DaubMunley, Brian J, MD  metoprolol succinate (TOPROL-XL) 25 MG 24 hr tablet Take 25 mg by mouth daily.    Yes [provider]  Multiple Vitamins-Minerals (MULTIVITAMIN WITH MINERALS) tablet Take 1 tablet by mouth daily.     Yes [provider]  nitroGLYCERIN (NITROSTAT) 0.4 MG SL tablet Place 0.4 mg under the tongue every 5 (five) minutes x 3 doses as needed for chest pain. 06/30/18  Yes [provider]  polyethylene glycol (MIRALAX / GLYCOLAX) packet Take 17 g by mouth daily as needed (constipation).   Yes [provider]  Polyvinyl Alcohol (LIQUID TEARS OP) Place 1 drop into both  eyes daily as needed (dry eyes).   Yes [provider]  TRAVATAN Z 0.004 % SOLN ophthalmic solution Place 1 drop into both eyes at bedtime.  09/09/13  Yes [provider]  warfarin (COUMADIN) 5 MG tablet Take as directed by Coumadin clinic Patient taking differently: Take 5-7.5 mg by mouth See admin instructions. Take 5 mg at supper daily except on Mondays take 7.5 mg 04/29/18  Yes Revankar, Aundra Dubinajan R, MD  oxyCODONE (OXY IR/ROXICODONE) 5 MG immediate release tablet Take 1 tablet (5 mg total) by mouth every 4 (four) hours as needed for moderate pain. 02/09/19   Emelia LoronWakefield, Matthew, MD    Family History History reviewed. No pertinent family history.  Social History Social History   Tobacco Use  . Smoking status: Former Smoker  Packs/day: 3.00    Years: 15.00    Pack years: 45.00    Types: Cigarettes    Quit date: 12/21/1973    Years since quitting: 45.1  . Smokeless tobacco: Never Used  . Tobacco comment: "quit smoking cigarettes in 1975  Substance Use Topics  . Alcohol use: No  . Drug use: No     Allergies   Statins, Fenofibrate, Penicillins, and Sulfonamide derivatives   Review of Systems Review of Systems  Constitutional: Negative for chills and fever.  HENT: Negative for congestion, rhinorrhea, sinus pressure and sore throat.   Eyes: Negative for pain and redness.  Respiratory: Negative for cough, shortness of breath and wheezing.   Cardiovascular: Negative for chest pain and palpitations.  Gastrointestinal: Positive for nausea. Negative for abdominal pain, constipation, diarrhea and vomiting.  Genitourinary: Positive for scrotal swelling. Negative for decreased urine volume, dysuria, hematuria, penile pain, penile swelling and testicular pain.  Musculoskeletal: Negative for arthralgias, back pain, myalgias and neck pain.  Skin: Negative for rash and wound.  Neurological: Positive for light-headedness. Negative for dizziness, syncope, weakness, numbness and  headaches.  Psychiatric/Behavioral: Negative for confusion.     Physical Exam Updated Vital Signs BP (!) 93/43 (BP Location: Right Arm)   Pulse 79   Temp 97.8 F (36.6 C) (Oral)   Resp 17   Ht 6\' 4"  (1.93 m)   Wt 86.2 kg   SpO2 98%   BMI 23.13 kg/m   Physical Exam Vitals signs and nursing note reviewed.  Constitutional:      General: He is not in acute distress.    Appearance: He is not ill-appearing.  HENT:     Head: Normocephalic and atraumatic. No raccoon eyes or Battle's sign.     Comments: No tenderness to palpation of skull. No deformities or crepitus noted. No open wounds, abrasions or lacerations.    Right Ear: Tympanic membrane and external ear normal. No hemotympanum.     Left Ear: Tympanic membrane and external ear normal. No hemotympanum.     Nose: Nose normal.     Mouth/Throat:     Mouth: Mucous membranes are dry.     Pharynx: Oropharynx is clear.  Eyes:     General: No scleral icterus.       Right eye: No discharge.        Left eye: No discharge.     Extraocular Movements: Extraocular movements intact.     Conjunctiva/sclera: Conjunctivae normal.     Pupils: Pupils are equal, round, and reactive to light.  Neck:     Musculoskeletal: Normal range of motion.     Vascular: No JVD.  Cardiovascular:     Rate and Rhythm: Normal rate and regular rhythm.     Pulses: Normal pulses.          Radial pulses are 2+ on the right side and 2+ on the left side.     Heart sounds: Normal heart sounds.  Pulmonary:     Comments: Lungs clear to auscultation in all fields. Symmetric chest rise. No wheezing, rales, or rhonchi. Abdominal:     Comments: Abdomen is soft, non-distended. Appropriately tender over hernia incision in right grown. Incision is clean, dray, intact.    Genitourinary:    Comments: Mild swelling of right scrotum, it appears dark from ecchymosis.  Musculoskeletal: Normal range of motion.       Arms:  Skin:    General: Skin is warm and dry.      Capillary Refill:  Capillary refill takes less than 2 seconds.  Neurological:     Mental Status: He is oriented to person, place, and time.     GCS: GCS eye subscore is 4. GCS verbal subscore is 5. GCS motor subscore is 6.     Comments: Speech is clear and goal oriented, follows commands CN III-XII intact, no facial droop Normal strength in upper and lower extremities bilaterally including dorsiflexion and plantar flexion, strong and equal grip strength Sensation normal to light and sharp touch Moves extremities without ataxia, coordination intact Normal finger to nose and rapid alternating movements   Psychiatric:        Behavior: Behavior normal.      ED Treatments / Results  Labs (all labs ordered are listed, but only abnormal results are displayed) Labs Reviewed  CBC WITH DIFFERENTIAL/PLATELET - Abnormal; Notable for the following components:      Result Value   WBC 18.5 (*)    RBC 2.04 (*)    Hemoglobin 6.8 (*)    HCT 20.3 (*)    Platelets 125 (*)    Neutro Abs 12.6 (*)    Monocytes Absolute 3.2 (*)    All other components within normal limits  BASIC METABOLIC PANEL - Abnormal; Notable for the following components:   Sodium 131 (*)    Glucose, Bld 218 (*)    BUN 51 (*)    Creatinine, Ser 2.51 (*)    Calcium 8.8 (*)    GFR calc non Af Amer 24 (*)    GFR calc Af Amer 28 (*)    All other components within normal limits  PROTIME-INR - Abnormal; Notable for the following components:   Prothrombin Time 24.0 (*)    INR 2.2 (*)    All other components within normal limits  SARS CORONAVIRUS 2 (HOSPITAL ORDER, PERFORMED IN [facility])  TYPE AND SCREEN  PREPARE RBC (CROSSMATCH)    EKG EKG Interpretation  Date/Time:  Thursday February 11 2019 11:59:17 EDT Ventricular Rate:  72 PR Interval:    QRS Duration: 156 QT Interval:  432 QTC Calculation: 473 R Axis:   173 Text Interpretation:  Electronic ventricular pacemaker Confirmed by Cathren Laine ([id number]) on  02/11/2019 12:14:25 PM   Radiology Dg Chest 2 View  Result Date: 02/11/2019 CLINICAL DATA:  Dizziness.  Recent hernia surgery. 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 LEFT lung and pleuroparenchymal scarring again noted. There is no evidence of focal airspace disease, pulmonary edema, suspicious pulmonary nodule/mass, pleural effusion, or pneumothorax. No acute bony abnormalities are identified. IMPRESSION: No evidence of acute cardiopulmonary disease. Electronically Signed   By: Harmon Pier M.D.   On: 02/11/2019 13:22   Ct Head Wo Contrast  Result Date: 02/11/2019 CLINICAL DATA:  Head injury. EXAM: CT HEAD WITHOUT CONTRAST TECHNIQUE: Contiguous axial images were obtained from the base of the skull through the vertex without intravenous contrast. COMPARISON:  None. FINDINGS: Brain: Mild diffuse cortical atrophy is noted. Mild chronic ischemic white matter disease is noted. No mass effect or midline shift is noted. Ventricular size is within normal limits. There is no evidence of mass lesion, hemorrhage or acute infarction. Vascular: No hyperdense vessel or unexpected calcification. Skull: Normal. Negative for fracture or focal lesion. Sinuses/Orbits: No acute finding. Other: None. IMPRESSION: Mild diffuse cortical atrophy. Mild chronic ischemic white matter disease. No acute intracranial abnormality seen. Electronically Signed   By: Zenda Alpers.D.  On: 02/11/2019 14:28    Procedures .Critical Care Performed by: Cherre Robins, PA-C Authorized by: Cherre Robins, PA-C   Critical care provider statement:    Critical care time (minutes):  48   Critical care was time spent personally by me on the following activities:  Discussions with consultants, development of treatment plan with patient or surrogate, evaluation of patient's response to treatment, examination of patient, obtaining history from  patient or surrogate, review of old charts, re-evaluation of patient's condition, pulse oximetry and ordering and review of radiographic studies   (including critical care time)  Medications Ordered in ED Medications  0.9 %  sodium chloride infusion (has no administration in time range)  prothrombin complex conc human (KCENTRA) IVPB 1,630 Units (has no administration in time range)  0.9 %  sodium chloride infusion (Manually program via Guardrails IV Fluids) (has no administration in time range)  sodium chloride 0.9 % 1,000 mL with potassium chloride 20 mEq infusion (has no administration in time range)  ciprofloxacin (CIPRO) IVPB 400 mg (has no administration in time range)  sodium chloride 0.9 % bolus 500 mL (500 mLs Intravenous New Bag/Given 02/11/19 1358)     Initial Impression / Assessment and Plan / ED Course  I have reviewed the triage vital signs and the nursing notes.  Pertinent labs & imaging results that were available during my care of the patient were reviewed by me and considered in my medical decision making (see chart for details).  Pt presents with light headedness and recent fall. He is POD 3 of right inguinal hernia repair with mesh with Dr. Donne Hazel. On arrival he is afebrile and hypotensive. He is pale. His incision appears to be dehiscing in the middle with minimal bleeding. His right scrotum is mildly swollen and appears dark from ecchymosis.  He has large hematoma to hip that is extending up right flank, he reports it appears much bigger than yesterday. His neuro exam is normal. Given he fell x 2 days ago CT head ordered and is negative for signs of bleeding. His labs show leukocytosis of 18.5, hemoglobin of 6.8. Type and screen is pending. Plan to transfuse 2 units. INR is 2.2. Given his cardiac history small fluid bolus given which improved BP. Chest xray is without fracture, disocation, or signs of infiltrate.  Pt evaluated by surgical PA-C K. Maxwell Caul who is planning to  take pt to OR with Dr. Donne Hazel to evacuate blood in groin. On reassessment pt appears anxious, but is comfortable with plan. He has no further questions and will be going to OR after transfusion. Findings and plan of care discussed with supervising physician Dr. Ashok Cordia.  This note was prepared using Dragon voice recognition software and may include unintentional dictation errors due to the inherent limitations of voice recognition software.   Final Clinical Impressions(s) / ED Diagnoses   Final diagnoses:  None    ED Discharge Orders    None       Flint Melter 02/12/19 0820    Lajean Saver, MD 02/12/19 1520

## 2019-02-11 NOTE — Transfer of Care (Signed)
Immediate Anesthesia Transfer of Care Note  Patient: Quin[place]  Procedure(s) Performed: RIGHT GROIN HEMATOMA EVACUATION (Right Groin)  Patient Location: PACU  Anesthesia Type:General  Level of Consciousness: awake, alert  and oriented  Airway & Oxygen Therapy: Patient Spontanous Breathing and Patient connected to face mask oxygen  Post-op Assessment: Report given to RN and Post -op Vital signs reviewed and stable  Post vital signs: Reviewed and stable  Last Vitals:  Vitals Value Taken Time  BP 122/68 02/11/19 2008  Temp    Pulse 71 02/11/19 2012  Resp 14 02/11/19 2012  SpO2 100 % 02/11/19 2012  Vitals shown include unvalidated device data.  Last Pain:  Vitals:   02/11/19 1816  TempSrc: Oral  PainSc:          Complications: No apparent anesthesia complications

## 2019-02-11 NOTE — Anesthesia Postprocedure Evaluation (Signed)
Anesthesia Post Note  Patient: [name]  Procedure(s) Performed: RIGHT GROIN HEMATOMA EVACUATION (Right Groin)     Patient location during evaluation: PACU Anesthesia Type: General Level of consciousness: awake and alert Pain management: pain level controlled Vital Signs Assessment: post-procedure vital signs reviewed and stable Respiratory status: spontaneous breathing, nonlabored ventilation, respiratory function stable and patient connected to nasal cannula oxygen Cardiovascular status: blood pressure returned to baseline and stable Postop Assessment: no apparent nausea or vomiting Anesthetic complications: no    Last Vitals:  Vitals:   02/11/19 2025 02/11/19 2040  BP: 127/63 118/63  Pulse: 71 69  Resp: 16 15  Temp:    SpO2: 95% 96%    Last Pain:  Vitals:   02/11/19 2040  TempSrc:   PainSc: 0-No pain                 Effie Berkshire

## 2019-02-11 NOTE — Anesthesia Procedure Notes (Signed)
Procedure Name: Intubation Date/Time: 02/11/2019 7:12 PM Performed by: Candis Shine, CRNA Pre-anesthesia Checklist: Patient identified, Emergency Drugs available, Suction available and Patient being monitored Patient Re-evaluated:Patient Re-evaluated prior to induction Oxygen Delivery Method: Circle System Utilized Preoxygenation: Pre-oxygenation with 100% oxygen Induction Type: IV induction and Rapid sequence Laryngoscope Size: Mac and 4 Grade View: Grade I Tube type: Oral Tube size: 7.5 mm Number of attempts: 1 Airway Equipment and Method: Stylet Placement Confirmation: ETT inserted through vocal cords under direct vision,  positive ETCO2 and breath sounds checked- equal and bilateral Secured at: 23 cm Tube secured with: Tape Dental Injury: Teeth and Oropharynx as per pre-operative assessment

## 2019-02-11 NOTE — Progress Notes (Signed)
Patient ID: Spencer Burnet Sr., male   DOB: [birth year], 73 y.o.   MRN: [id number] Will remain on tele overnight Hold coumadin for at least 48 hours and follow hct check labs postop and in am Will have cardiology see also Follow creatinine and hopefully this will return to normal with resuscitation

## 2019-02-11 NOTE — Op Note (Signed)
Preoperative diagnosis: Right groin hematoma status post right inguinal hernia repair Postoperative diagnosis: Same as above Procedure: Right groin hematoma evacuation Surgeon: Dr. Serita Grammes Anesthesia: General Estimated blood loss: None during the procedure Complications: None Drains: None Specimens: None Special count was correct at completion Disposition to recovery in stable condition  Indications: This is a 73 year old male who is status post a right inguinal hernia repair this week.  I kept him overnight.  The following day he had some moderate swelling at his incision but no hematoma.  He went to live in his motor home.  He had a fall that day and I had restarted his Coumadin the night of surgery due to his cardiac comorbidities.  The night prior to admission he began to have a very significant hematoma around his groin that wrapped to his back.  He then presented to the emergency room.  He was noted to have a hematocrit of 20, an INR of 2.2 and a significant hematoma.  I discussed with him receiving packed red blood cells as well as going to the operating room to evacuate this hematoma.  Procedure: After informed consent was obtained he was taken to the operating room.  He had received blood at this point.  He received a total of 3 units of blood in and around the surgery.  He then underwent general anesthesia.  He was prepped and draped in the standard sterile surgical fashion.  A surgical timeout was then performed.  I reopened his old incision.  All of the blood was above the external oblique.  I evacuated a large amount of clotted blood.  I irrigated this copiously then.  I made a systematic search for anything that was bleeding and I could not identify anything that was bleeding at this time.  There was no blood coming from underneath the oblique so I did not open this and expose the mesh.  I then placed Arista throughout the cavity.  I closed this then in several layers with 2  layers of 2-0 Vicryl.  I then closed the layer with 3-0 Vicryl and then 4-0 Monocryl.  Glue and Steri-Strips were applied.  He tolerated this well.  He had a Foley placed at completion of the operation.  He was then extubated transferred to recovery in stable condition.

## 2019-02-12 ENCOUNTER — Encounter (HOSPITAL_COMMUNITY): Payer: Self-pay | Admitting: *Deleted

## 2019-02-12 DIAGNOSIS — I420 Dilated cardiomyopathy: Secondary | ICD-10-CM

## 2019-02-12 DIAGNOSIS — L8990 Pressure ulcer of unspecified site, unspecified stage: Secondary | ICD-10-CM | POA: Insufficient documentation

## 2019-02-12 DIAGNOSIS — N179 Acute kidney failure, unspecified: Secondary | ICD-10-CM

## 2019-02-12 DIAGNOSIS — S301XXA Contusion of abdominal wall, initial encounter: Secondary | ICD-10-CM

## 2019-02-12 LAB — TYPE AND SCREEN
ABO/RH(D): O POS
Antibody Screen: NEGATIVE
Unit division: 0
Unit division: 0
Unit division: 0

## 2019-02-12 LAB — BASIC METABOLIC PANEL
Anion gap: 9 (ref 5–15)
BUN: 41 mg/dL — ABNORMAL HIGH (ref 8–23)
CO2: 24 mmol/L (ref 22–32)
Calcium: 8.6 mg/dL — ABNORMAL LOW (ref 8.9–10.3)
Chloride: 104 mmol/L (ref 98–111)
Creatinine, Ser: 1.65 mg/dL — ABNORMAL HIGH (ref 0.61–1.24)
GFR calc Af Amer: 47 mL/min — ABNORMAL LOW (ref >60)
GFR calc non Af Amer: 41 mL/min — ABNORMAL LOW (ref >60)
Glucose, Bld: 147 mg/dL — ABNORMAL HIGH (ref 70–99)
Potassium: 3.9 mmol/L (ref 3.5–5.1)
Sodium: 137 mmol/L (ref 135–145)

## 2019-02-12 LAB — BPAM RBC
Blood Product Expiration Date: 202010032359
Blood Product Expiration Date: 202010032359
Blood Product Expiration Date: 202010032359
ISSUE DATE / TIME: 202008271747
ISSUE DATE / TIME: 202008271916
ISSUE DATE / TIME: 202008271917
Unit Type and Rh: 5100
Unit Type and Rh: 5100
Unit Type and Rh: 5100

## 2019-02-12 LAB — CBC
HCT: 27.6 % — ABNORMAL LOW (ref 39.0–52.0)
HCT: 27.4 % — ABNORMAL LOW (ref 39.0–52.0)
Hemoglobin: 9.4 g/dL — ABNORMAL LOW (ref 13.0–17.0)
Hemoglobin: 9.2 g/dL — ABNORMAL LOW (ref 13.0–17.0)
MCH: 32.8 pg (ref 26.0–34.0)
MCH: 32.5 pg (ref 26.0–34.0)
MCHC: 34.1 g/dL (ref 30.0–36.0)
MCHC: 33.6 g/dL (ref 30.0–36.0)
MCV: 96.2 fL (ref 80.0–100.0)
MCV: 96.8 fL (ref 80.0–100.0)
Platelets: 101 10*3/uL — ABNORMAL LOW (ref 150–400)
Platelets: 115 10*3/uL — ABNORMAL LOW (ref 150–400)
RBC: 2.87 MIL/uL — ABNORMAL LOW (ref 4.22–5.81)
RBC: 2.83 MIL/uL — ABNORMAL LOW (ref 4.22–5.81)
RDW: 13.8 % (ref 11.5–15.5)
RDW: 14.1 % (ref 11.5–15.5)
WBC: 12.4 10*3/uL — ABNORMAL HIGH (ref 4.0–10.5)
WBC: 10.5 10*3/uL (ref 4.0–10.5)
nRBC: 0 % (ref 0.0–0.2)
nRBC: 0 % (ref 0.0–0.2)

## 2019-02-12 LAB — PROTIME-INR
INR: 1.5 — ABNORMAL HIGH (ref 0.8–1.2)
Prothrombin Time: 18.2 seconds — ABNORMAL HIGH (ref 11.4–15.2)

## 2019-02-12 LAB — GLUCOSE, CAPILLARY
Glucose-Capillary: 125 mg/dL — ABNORMAL HIGH (ref 70–99)
Glucose-Capillary: 194 mg/dL — ABNORMAL HIGH (ref 70–99)
Glucose-Capillary: 162 mg/dL — ABNORMAL HIGH (ref 70–99)
Glucose-Capillary: 137 mg/dL — ABNORMAL HIGH (ref 70–99)

## 2019-02-12 NOTE — Plan of Care (Signed)
  Problem: Education: Goal: Knowledge of General Education information will improve Description Including pain rating scale, medication(s)/side effects and non-pharmacologic comfort measures Outcome: Progressing   Problem: Health Behavior/Discharge Planning: Goal: Ability to manage health-related needs will improve Outcome: Progressing   Problem: Clinical Measurements: Goal: Ability to maintain clinical measurements within normal limits will improve Outcome: Progressing Goal: Will remain free from infection Outcome: Progressing Goal: Diagnostic test results will improve Outcome: Progressing Goal: Respiratory complications will improve Outcome: Progressing Goal: Cardiovascular complication will be avoided Outcome: Progressing   Problem: Activity: Goal: Risk for activity intolerance will decrease Outcome: Progressing   Problem: Nutrition: Goal: Adequate nutrition will be maintained Outcome: Progressing   Problem: Coping: Goal: Level of anxiety will decrease Outcome: Progressing   Problem: Elimination: Goal: Will not experience complications related to bowel motility Outcome: Progressing Goal: Will not experience complications related to urinary retention Outcome: Progressing   Problem: Pain Managment: Goal: General experience of comfort will improve Outcome: Progressing   Problem: Safety: Goal: Ability to remain free from injury will improve Outcome: Progressing   Problem: Skin Integrity: Goal: Risk for impaired skin integrity will decrease Outcome: Progressing   Problem: Clinical Measurements: Goal: Postoperative complications will be avoided or minimized Outcome: Progressing   Problem: Skin Integrity: Goal: Demonstration of wound healing without infection will improve Outcome: Progressing   

## 2019-02-12 NOTE — Progress Notes (Signed)
1 Day Post-Op   Subjective/Chief Complaint: Feels better this am, no issues overnight after surgery   Objective: Vital signs in last 24 hours: Temp:  [97 F (36.1 C)-98 F (36.7 C)] 97.8 F (36.6 C) (08/28 0308) Pulse Rate:  [69-79] 70 (08/28 0308) Resp:  [13-20] 17 (08/28 0308) BP: (89-127)/(43-68) 116/60 (08/28 0308) SpO2:  [94 %-100 %] 97 % (08/28 0308) Weight:  [86.2 kg-90.5 kg] 90.5 kg (08/27 2104)    Intake/Output from previous day: 08/27 0701 - 08/28 0700 In: 2144.1 [I.V.:954.1; Blood:630; IV Piggyback:560] Out: 25 [Blood:25] Intake/Output this shift: Total I/O In: 1584.1 [I.V.:954.1; Blood:630] Out: 25 [Blood:25]  General appearance: no distress Resp: clear to auscultation bilaterally Cardio: irregularly irregular rhythm GI: soft nontender ecchymosis old and stable, right groin incision without infection, some seroma  Lab Results:  Recent Labs    02/11/19 2253 02/12/19 0304  WBC 13.9* 12.4*  HGB 10.3* 9.4*  HCT 29.8* 27.6*  PLT 112* 101*   BMET Recent Labs    02/11/19 2253 02/12/19 0304  NA 136 137  K 4.2 3.9  CL 102 104  CO2 26 24  GLUCOSE 190* 147*  BUN 44* 41*  CREATININE 1.74* 1.65*  CALCIUM 8.9 8.6*   PT/INR Recent Labs    02/11/19 1703 02/12/19 0304  LABPROT 17.4* 18.2*  INR 1.5* 1.5*   ABG No results for input(s): PHART, HCO3 in the last 72 hours.  Invalid input(s): PCO2, PO2  Studies/Results: Dg Chest 2 View  Result Date: 02/11/2019 CLINICAL DATA:  Dizziness.  Recent hernia surgery. 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 LEFT lung and pleuroparenchymal scarring again noted. There is no evidence of focal airspace disease, pulmonary edema, suspicious pulmonary nodule/mass, pleural effusion, or pneumothorax. No acute bony abnormalities are identified. IMPRESSION: No evidence of acute cardiopulmonary disease. Electronically Signed   By: Harmon Pier M.D.   On: 02/11/2019 13:22   Ct Head Wo Contrast  Result Date: 02/11/2019 CLINICAL DATA:  Head injury. EXAM: CT HEAD WITHOUT CONTRAST TECHNIQUE: Contiguous axial images were obtained from the base of the skull through the vertex without intravenous contrast. COMPARISON:  None. FINDINGS: Brain: Mild diffuse cortical atrophy is noted. Mild chronic ischemic white matter disease is noted. No mass effect or midline shift is noted. Ventricular size is within normal limits. There is no evidence of mass lesion, hemorrhage or acute infarction. Vascular: No hyperdense vessel or unexpected calcification. Skull: Normal. Negative for fracture or focal lesion. Sinuses/Orbits: No acute finding. Other: None. IMPRESSION: Mild diffuse cortical atrophy. Mild chronic ischemic white matter disease. No acute intracranial abnormality seen. Electronically Signed   By: Lupita Raider M.D.   On: 02/11/2019 14:28    Anti-infectives: Anti-infectives (From admission, onward)   Start     Dose/Rate Route Frequency Ordered Stop   02/12/19 1000  doxycycline (VIBRA-TABS) tablet 100 mg     100 mg Oral Every 12 hours 02/11/19 2041     02/11/19 1920  ciprofloxacin (CIPRO) 400 MG/200ML IVPB    Note to Pharmacy: Sabino Niemann   : cabinet override      02/11/19 1920 02/11/19 1920   02/11/19 1530  ciprofloxacin (CIPRO) IVPB 400 mg     400 mg 200 mL/hr over 60 Minutes Intravenous To Surgery 02/11/19 1513 02/11/19 2020      Assessment/Plan: POD 4/1 RIH/hematoma evacuation- [facility] 1. Neuro- tramadol and tylenol, had fall while on oxycodone at home so will  minimize narcotics 2. Pulm toilet, oob today, PT consult to ensure he is safe 3. CV- restart home meds, will hold coumadin at least 48 hours to ensure no further hematoma, cardiology to see today, I have not restarted his lasix yet due to creatinine elevation 4. Renal- Cr improved today, making urine, will dc foley and follow Cr 5. GI- can advance diet to carb  modified 6. Endo- SSI for now, carb mod diet 7. Can start lovenox over weekend if hct remains stable 8. Heme- follow hct, inr ok at 1.5 9. scds Rolm Bookbinder 02/12/2019

## 2019-02-12 NOTE — Consult Note (Addendum)
Cardiology Consultation:   Patient ID: Spencer Doony R Pruett Sr. MRN: [id number]; DOB: 11/13/1945  Admit date: 02/11/2019 Date of Consult: 02/12/2019  Primary Care Provider: Patient, No Pcp Per Primary Cardiologist: Previously Dr. Donnie Ahoilley, transitioned to Dr. Jetta LoutWilliam Jackson [place], [place] Primary Electrophysiologist:  Lewayne BuntingGregg Taylor, MD    Patient Profile:   Spencer Doony R Holeman Sr. is a 73 y.o. male with a hx of CHB s/p BiV AICD, chronic systolic heart failure/ischemic cardiomyopathy, CAD s/p CABG in 1982 and redo in 1996, hypertension, hyperlipidemia, type 2 diabetes who is being seen today status post inguinal hernia repair with subsequent hematoma while on coumadin.  Dr. Dwain SarnaWakefield is requesting cardiology input on medication management.  History of Present Illness:   Spencer Shelton was followed for many years by Dr. Donnie Ahoilley for general cardiology and Dr. Ladona Ridgelaylor for EP and following ICD.  The patient recently transitioned cardiology care to Dr. J[place] [facility]outWilliam [place] [facility]. [place] [facility] the [place] 20 years to follow with Dr. Donnie Ahoilley, who recently retired.  He was last seen by Dr. Donnie Ahoilley on 07/31/2017 and was reportedly doing well without any angina.  Most recent [facility]2018 showed  LVEF 30-35%, the anterior wall appears moderately hypokinetic, apex appears severely hypokinetic. The patient reports that he had a stress test about 2 weeks ago for clearance for surgery and it did not show ischemia. EF was reportedly 27%, consistent with prior.   The patient [place]. He reportedly needs a right ankle replacement and is unable to walk as much as he used to. He denies any recent DOE or chest discomfort.   The patient was last seen our office on 05/14/2016 by Dr. Ladona Ridgelaylor at which time the patient was noted to be well compensated from a heart failure standpoint.  The patient underwent to right inguinal hernia repair  on 02/08/2019.  He was discharged on 02/09/2019, reportedly doing well.  His Coumadin was restarted at the time.  The patient had a fall due to dizziness when walking up steps to his camper upon arrival home.  The wife reportedly attributed his fall to having been medicated with oxycodone 10 mg prior to discharge.  The wife states that he began bruising over his incision on Wednesday night.  He had an appointment with Dr. Doreen SalvageWakefield's office on Thursday, 02/11/2019 but was too weak and dizzy to get out of his car.  [facility] where it was noted that his hemoglobin was 6.8.  Serum creatinine was 2.5.  INR was 2.2.  The patient was given K Centra to reverse his INR as well as 3 units of packed red blood cells.  The patient was taken back to the OR for evacuation of a large amount of clotted blood.  Cardiac medications were held and cardiology has been consulted for input on medication management.  Hemoglobin 6.8 >> 10.3 after transfusion.  Hemoglobin 9.4 today. INR was 2.2 on presentation.  After reversal INR is 1.5 today.  Today his BP is strong and he feels better. He has been up to the bathroom several times and says that he is no longer dizzy. He denies shortness of breath, orthopnea or chest discomfort. He is still receiving IV fluids at 50 ml/hr. He has no edema and lungs are clear.   He says that Dr. Dwain SarnaWakefield mentioned holding the warfarin until Monday.   Notes on stress test  from Dr. Jetta Lout:   Heart Pathway Score:     Past Medical History:  Diagnosis Date   Anemia    ? per dr Dwain Sarna nurs   Anterior myocardial infarction [facility]) 1982; 1995   Anxiety    Asthma    "when I was a boy"   Atrial fibrillation (HCC)    Automatic implantable cardioverter-defibrillator in situ    BI VENTRICULAR   Cardiomyopathy (HCC)    CHF (congestive heart failure) (HCC)    Cholecystitis    Complete heart block (HCC)    Coronary artery disease     with known occlusions of all of his native coronary   Hepatitis    High cholesterol    History of kidney stones    Hypertension    Pneumonia    "couple times" (10/08/2013)?   Type II diabetes mellitus (HCC)    no rx   Ventricular tachycardia [facility])     Past Surgical History:  Procedure Laterality Date   BI-VENTRICULAR IMPLANTABLE CARDIOVERTER DEFIBRILLATOR  (CRT-D)  02/2008; 10/08/2013   MDT CRTD implanted by Dr Ladona Ridgel for primary prevention and CHF 2009 with 6949 lead; 09/2013 - gen change and RV lead revision by Dr Ladona Ridgel   BIV ICD [facility] CHANGE OUT N/A 10/08/2013   Procedure: BIV ICD GENERTAOR CHANGE OUT;  Surgeon: Marinus Maw, MD;  Location: [facility] CATH LAB;  Service: Cardiovascular;  Laterality: N/A;   CARDIAC CATHETERIZATION     "several"   CATARACT EXTRACTION W/ INTRAOCULAR LENS  IMPLANT, BILATERAL Bilateral ?2007   CHOLECYSTECTOMY N/A 12/22/2013   Procedure: LAPAROSCOPIC CHOLECYSTECTOMY, POSSIBLE OPEN;  Surgeon: Emelia Loron, MD;  Location: [facility] OR;  Service: General;  Laterality: N/A;   CORONARY ARTERY BYPASS GRAFT  1982/1996   "CABG X5; CABG X4"    EYE SURGERY Bilateral    cataract   gallbladder drain  5/15   INGUINAL HERNIA REPAIR Right 02/08/2019   Procedure: RIGHT INGUINAL HERNIA REPAIR WITH MESH;  Surgeon: Emelia Loron, MD;  Location: [facility] OR;  Service: General;  Laterality: Right;  TAP BLOCK   INSERTION OF MESH Right 02/08/2019   Procedure: Insertion Of Mesh;  Surgeon: Emelia Loron, MD;  Location: [facility] OR;  Service: General;  Laterality: Right;   IRRIGATION AND DEBRIDEMENT HEMATOMA Right 02/11/2019   Procedure: RIGHT GROIN HEMATOMA EVACUATION;  Surgeon: Emelia Loron, MD;  Location: [facility] OR;  Service: General;  Laterality: Right;   LEAD REVISION N/A 10/08/2013   Procedure: LEAD REVISION;  Surgeon: Marinus Maw, MD;  Location: [facility] CATH LAB;  Service: Cardiovascular;  Laterality: N/A;     Home Medications:  Prior to Admission medications    Medication Sig Start Date End Date Taking? Authorizing Provider  benazepril (LOTENSIN) 40 MG tablet Take 40 mg by mouth daily.    Yes [provider]  CINNAMON PO Take 1 tablet by mouth 2 (two) times daily.   Yes [provider]  Coenzyme Q10 (COQ-10 PO) Take 1 tablet by mouth daily.   Yes [provider]  COMBIGAN 0.2-0.5 % ophthalmic solution Place 1 drop into both eyes 2 (two) times daily. 07/15/18  Yes [provider]  Cyanocobalamin (VITAMIN B-12) 2500 MCG SUBL Place 2,500 mcg under the tongue daily.    Yes [provider]  ezetimibe (ZETIA) 10 MG tablet Take 10 mg by mouth daily.     Yes [provider]  furosemide (LASIX) 80 MG tablet TAKE 1 TABLET BY MOUTH TWICE DAILY Patient taking differently: Take 80 mg  by mouth 2 (two) times daily.  05/20/18  Yes Richardo Priest, MD  metoprolol succinate (TOPROL-XL) 25 MG 24 hr tablet Take 25 mg by mouth daily.    Yes [provider]  Multiple Vitamins-Minerals (MULTIVITAMIN WITH MINERALS) tablet Take 1 tablet by mouth daily.     Yes [provider]  nitroGLYCERIN (NITROSTAT) 0.4 MG SL tablet Place 0.4 mg under the tongue every 5 (five) minutes x 3 doses as needed for chest pain. 06/30/18  Yes [provider]  polyethylene glycol (MIRALAX / GLYCOLAX) packet Take 17 g by mouth daily as needed (constipation).   Yes [provider]  Polyvinyl Alcohol (LIQUID TEARS OP) Place 1 drop into both eyes daily as needed (dry eyes).   Yes [provider]  TRAVATAN Z 0.004 % SOLN ophthalmic solution Place 1 drop into both eyes at bedtime.  09/09/13  Yes [provider]  warfarin (COUMADIN) 5 MG tablet Take as directed by Coumadin clinic Patient taking differently: Take 5-7.5 mg by mouth See admin instructions. Take 5 mg at supper daily except on Mondays take 7.5 mg 04/29/18  Yes Revankar, Reita Cliche, MD  oxyCODONE (OXY IR/ROXICODONE) 5 MG immediate release tablet  Take 1 tablet (5 mg total) by mouth every 4 (four) hours as needed for moderate pain. 02/09/19   Rolm Bookbinder, MD    Inpatient Medications: Scheduled Meds:  acetaminophen  1,000 mg Oral Q6H   brimonidine  1 drop Both Eyes BID   And   timolol  1 drop Both Eyes BID   docusate sodium  100 mg Oral BID   doxycycline  100 mg Oral Q12H   ezetimibe  10 mg Oral Daily   insulin aspart  0-15 Units Subcutaneous TID WC   insulin aspart  0-5 Units Subcutaneous QHS   latanoprost  1 drop Both Eyes QHS   metoprolol succinate  25 mg Oral Daily   Continuous Infusions:  0.9 % NaCl with KCl 20 mEq / L 50 mL/hr at 02/12/19 1400   PRN Meds: diphenhydrAMINE **OR** diphenhydrAMINE, morphine injection, nitroGLYCERIN, ondansetron **OR** ondansetron (ZOFRAN) IV, polyethylene glycol, simethicone, traMADol  Allergies:    Allergies  Allergen Reactions   Statins Swelling and Other (See Comments)    "hurt all over"   Fenofibrate Rash   Penicillins Rash    Did it involve swelling of the face/tongue/throat, SOB, or low BP? No Did it involve sudden or severe rash/hives, skin peeling, or any reaction on the inside of your mouth or nose? No Did you need to seek medical attention at a hospital or doctor's office? No When did it last happen?30 +years If all above answers are NO, may proceed with cephalosporin use.    Sulfonamide Derivatives Rash    Social History:   Social History   Socioeconomic History   Marital status: Married    Spouse name: Not on file   Number of children: Not on file   Years of education: Not on file   Highest education level: Not on file  Occupational History   Occupation: Retired    Fish farm manager: RETIRED  Scientist, product/process development strain: Not on file   Food insecurity    Worry: Not on file    Inability: Not on file   Transportation needs    Medical: Not on file    Non-medical: Not on file  Tobacco Use   Smoking status: Former  Smoker    Packs/day: 3.00    Years: 15.00  Pack years: 45.00    Quit date: 12/21/1973    Years since quitting: 45.1   Smokeless tobacco: Never Used   Tobacco comment: "quit smoking cigarettes in 1975  Substance and Sexual Activity   Alcohol use: No   Drug use: No   Sexual activity: Not Currently  Lifestyle   Physical activity    Days per week: Not on file    Minutes per session: Not on file   Stress: Not on file  Relationships   Social connections    Talks on phone: Not on file    Gets together: Not on file    Attends religious service: Not on file    Active member of club or organization: Not on file    Attends meetings of clubs or organizations: Not on file    Relationship status: Not on file   Intimate partner violence    Fear of current or ex partner: Not on file    Emotionally abused: Not on file    Physically abused: Not on file    Forced sexual activity: Not on file  Other Topics Concern   Not on file  Social History Narrative   Not on file    Family History:    Family History  Problem Relation Age of Onset   Diabetes Father    Rheumatic fever Mother    Stroke Mother    CAD Mother        died of MI at age 13     ROS:  Please see the history of present illness.   All other ROS reviewed and negative.     Physical Exam/Data:   Vitals:   02/12/19 0600 02/12/19 0615 02/12/19 0800 02/12/19 1200  BP: 125/64  (!) 132/59 133/69  Pulse: 77 70 70 88  Resp: 17 15 16 18   Temp:   97.6 F (36.4 C) (!) 97.4 F (36.3 C)  TempSrc:   Oral Oral  SpO2: 97% 96% 96% 98%  Weight:      Height:        Intake/Output Summary (Last 24 hours) at 02/12/2019 1451 Last data filed at 02/12/2019 1400 Gross per 24 hour  Intake 3965.49 ml  Output 1025 ml  Net 2940.49 ml   Last 3 Weights 02/11/2019 02/11/2019 02/08/2019  Weight (lbs) 199 lb 8.3 oz 190 lb 190 lb  Weight (kg) 90.5 kg 86.183 kg 86.183 kg     Body mass index is 24.29 kg/m.  General:  Well  nourished, well developed, in no acute distress HEENT: normal Lymph: no adenopathy Neck: no JVD Endocrine:  No thryomegaly Vascular: No carotid bruits; FA pulses 2+ bilaterally without bruits  Cardiac:  normal S1, S2; RRR; no murmur  Lungs:  clear to auscultation bilaterally, no wheezing, rhonchi or rales  Abd: soft, nontender, no hepatomegaly  Ext: no edema Musculoskeletal:  No deformities, BUE and BLE strength normal and equal Skin: warm and dry  Neuro:  CNs 2-12 intact, no focal abnormalities noted Psych:  Normal affect   EKG:  The EKG was personally reviewed and demonstrates: Ventricular pacing, 72 bpm Telemetry:  Telemetry was personally reviewed and demonstrates:  Ventricular pacing at 70 bpm with occ PVCs  Relevant CV Studies:  TTE 08/06/18 : Summary: 1.  LV systolic function is moderately reduced. 2.  The anterior wall appears moderately hypokinetic. 3.  The apex appears severely hypokinetic. 4.  Estimated left ventricle ejection fraction is 30 to 35%. 5.  There is a pacemaker wire in the  RV. 6.  Left atrium chamber is moderately dilated. 7.  LA diameter is 5.4 cm. 8.  The aortic valve is mildly calcified. 9.  There is mild mitral annular calcification. 10.  There is moderate mitral regurgitation. 11.  There is mild tricuspid rotation. 12.  The estimated RV systolic pressure is normal. 13.  The estimated RV systolic pressure is 32 mmHg. 14.  The size of the visualized portion of the aortic root is within normal limits.  Carotid US 08/18/2017: Final Interpretation: Right Carotid: Velocities in the right ICA are consistent with a 1-39% stenosis.  Left Carotid: Velocities in the left ICA are consistent with a 1-39% stenosis.  The ECA appears >50% stenosed. Calcific plaque may obscure higher velocity bilaterally .  Vertebrals: Both vertebral arteries were patent with antegrade flow.  Laboratory Data:  High Sensitivity Troponin:  No results for input(s): TROPONINIHS  in the last 720 hours.   Chemistry Recent Labs  Lab 02/11/19 1250 02/11/19 2253 02/12/19 0304  NA 131* 136 137  K 3.8 4.2 3.9  CL 98 102 104  CO2 GLUCOSE 218* 190* 147*  BUN 51* 44* 41*  CREATININE 2.51* 1.74* 1.65*  CALCIUM 8.8* 8.9 8.6*  GFRNONAA 24* 38* 41*  GFRAA 28* 44* 47*  ANIONGAP No results for input(s): PROT, ALBUMIN, AST, ALT, ALKPHOS, BILITOT in the last 168 hours. Hematology Recent Labs  Lab 02/11/19 1250 02/11/19 2253 02/12/19 0304  WBC 18.5* 13.9* 12.4*  RBC 2.04* 3.13* 2.87*  HGB 6.8* 10.3* 9.4*  HCT 20.3* 29.8* 27.6*  MCV 99.5 95.2 96.2  MCH 33.3 32.9 32.8  MCHC 33.5 34.6 34.1  RDW 13.9 13.7 13.8  PLT 125* 112* 101*   BNPNo results for input(s): BNP, PROBNP in the last 168 hours.  DDimer No results for input(s): DDIMER in the last 168 hours.   Radiology/Studies:  Dg Chest 2 View  Result Date: 02/11/2019 CLINICAL DATA:  Dizziness.  Recent hernia surgery. 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 LEFT lung and pleuroparenchymal scarring again noted. There is no evidence of focal airspace disease, pulmonary edema, suspicious pulmonary nodule/mass, pleural effusion, or pneumothorax. No acute bony abnormalities are identified. IMPRESSION: No evidence of acute cardiopulmonary disease. Electronically Signed   By: Harmon Pier M.D.   On: 02/11/2019 13:22   Ct Head Wo Contrast  Result Date: 02/11/2019 CLINICAL DATA:  Head injury. EXAM: CT HEAD WITHOUT CONTRAST TECHNIQUE: Contiguous axial images were obtained from the base of the skull through the vertex without intravenous contrast. COMPARISON:  None. FINDINGS: Brain: Mild diffuse cortical atrophy is noted. Mild chronic ischemic white matter disease is noted. No mass effect or midline shift is noted. Ventricular size is within normal limits. There is no evidence of mass lesion, hemorrhage or acute infarction.  Vascular: No hyperdense vessel or unexpected calcification. Skull: Normal. Negative for fracture or focal lesion. Sinuses/Orbits: No acute finding. Other: None. IMPRESSION: Mild diffuse cortical atrophy. Mild chronic ischemic white matter disease. No acute intracranial abnormality seen. Electronically Signed   By: Lupita Raider M.D.   On: 02/11/2019 14:28    Assessment and Plan:   Chronic systolic heart failure/ischemic cardiomyopathy -Patient has been well compensated for years.  He is medically managed with benazepril 40 mg daily, Lasix 80 mg twice daily, Toprol-XL 25 mg daily. -Benazapril and lasix are currently on hold due to presentation with acute blood  loss anemia requiring evacuation of groin hematoma, s/p inguinal hernia repair and resumption of coumadin. Pt had AKI. He received transfusion, 3 units, and continues on IV fluids at 50 ml/hr. He was initially hypotensive. Now with strong BP.  -No S/S of volume overload.  -Renal function is improving.  -Will need to watch fluid status. Strict I&O, daily weights.  -Would continue to hold ACE-I for now. Could follow as outpatient and consider resuming at that point.  -Continue with gentle IV hydration for renal function and possibly discontinue tomorrow based on labs and renal function.   Acute blood loss anemia -Presented with dizziness, hypotension, anemia and AKI.  -Patient developed hematoma at the groin after right inguinal hernia repair and then had a fall disrupting his incision.  Hemoglobin was 6.8.  Serum creatinine was 2.5.  INR was 2.2. The patient was given K Centra to reverse his INR as well as 2 units of packed red blood cells.  The patient was taken back to the OR for evacuation of a large amount of clotted blood.  -Pt is improved.    CAD -Status post remote MI and CABG in 1982 with redo in 1996. -Patient has been stable for many years.  He was previously followed by Dr. Donnie Ahoilley and now has transferred cardiology care to Dr.  Jetta LoutWilliam Jackson [place] [facility] [place]. -Patient is on beta-blocker.  Not on aspirin due to need for anticoagulation.  Unable to tolerate statins per previous notes. -No chest discomfort or shortness of breath.   Chronic atrial fibrillation -Patient has been rate controlled on Toprol-XL 25 mg daily -Last download from ICD by Dr. Ladona Ridgelaylor in 07/2018 showed 0% A. fib burden.  Currently the patient is ventricular pacing. -Patient has been anticoagulated with warfarin for stroke risk reduction with CHA2DS2/VAS Stroke Risk Score of 5 (CHF, Vac Dz, HTN, Age, DM)  -Warfarin was reversed and currently on hold for hematoma following inguinal hernia repair. -Resume warfarin when OK with surgeon, could be held up to 8-10 days or as needed.   AKI -Previous labs were back in 2015 with normal renal function. -Creatinine on presentation was up to 2.51, likely due to decreased renal perfusion in the setting of blood loss anemia.  Serum creatinine is improving after transfusion and IV fluids 1.74>1.64 today.  Hypertension -Benazepril 40 mg daily, Toprol-XL 25 mg daily and Lasix.  -Pt was initially dizzy and hypotension.  ACE inhibitor is currently on hold.  Beta-blocker has been resumed. -Blood pressure began to improve yesterday and is strong today 130s/60. -Benazepril on hold due to AKI.  Hyperlipidemia -Patient not on a statin, allergy lists "hurt all over" with statins.  Fenofibrate causes rash.  Patient is on Zetia 10 mg daily.  History of CHB s/p BiV ICD -Followed by Dr. Lewayne BuntingGregg Taylor previously, now managed at Dr. Edison PaceJackson's [place] in [place]. -Medtronic ICD initially placed 02/2008 with lead revision and upgrade to CRT-D for primary prevention and CHF in 2015.  Bilateral carotid artery stenosis, mild -1-39% by carotid ultrasound in 08/2017   For questions or updates, please contact [facility] Please consult [email or web address] for contact info under     Signed, Berton BonJanine Hammond, NP   02/12/2019 [id number] PM  73 year old with longstanding history of coronary artery disease status post CABG with redo CABG in 1996 who has a dilated ischemic cardiomyopathy with biventricular ICD euvolemic with last interrogation showing no evidence of atrial fibrillation here with postoperative bleeding requiring 3 units of blood and evacuation of  hematoma.  Acute kidney injury is resolving with resuscitation.  He is comfortable in bed, no increased respiratory effort.  Regular rate and rhythm.  ICD noted.  No crackles heard on exam.  Hematoma evacuation postoperatively - I would be fine with him holding his Coumadin for the next 10 days.  Last interrogation of ICD did not show any evidence of atrial fibrillation.  I would like for his tissues to begin to heal prior to resuming anticoagulation.  Ischemic cardiomyopathy - Gentle IV hydration currently 50 mL/h.  This seems reasonable as is acute kidney injury is resolving.  No evidence of decreased oxygen saturations or crackles on exam.  He is comfortable. - Tomorrow morning, I would stop the IV hydration.  Obviously if symptoms of shortness of breath arise, stop earlier and give IV Lasix 40 mg. - I would continue to hold his Lasix.  Consider resuming in 1 week with close follow-up of basic metabolic profile by outpatient cardiology. - Metoprolol has been restarted.  Excellent.  Acute kidney injury in the setting of hemorrhagic shock (BP 92/46 on admit) - Benazepril currently on hold - I would recommend continuing to hold this medication until he is able to see his cardiologist in outpatient follow-up and have a basic metabolic profile drawn in the next week or 2.  Donato Schultz, MD

## 2019-02-12 NOTE — Evaluation (Signed)
Physical Therapy Evaluation Patient Details Name: Spencer ELLENBURG Sr. MRN: [id number] DOB: 11/08/1945 Today's Date: 02/12/2019   History of Present Illness  Pt is a 73 y.o. male who underwent a right inguinal hernia repair on Monday 02/08/19. He was discharged home 02/09/19. He fell at home due to dizziness. He presented to the ED 02/11/19 with c/o dizziness and weakness. Hgb 6.8. He underwent right groin hematoma evacuation 02/11/19 and received 2 units PRBC.    Clinical Impression  Pt admitted with above diagnosis. On eval, pt required supervision bed mobility, min guard assist sit to stand and min assist stand pivot transfer. 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 Unable to assess gait due to waiting on wife to bring brace from home. Pt without c/o dizziness during mobility. Pt currently with functional limitations due to the deficits listed below (see PT Problem List). Pt will benefit from skilled PT to increase their independence and safety with mobility to allow discharge to the venue listed below.       Follow Up Recommendations No PT follow up;Supervision for mobility/OOB    Equipment Recommendations  None recommended by PT    Recommendations for Other Services       Precautions / Restrictions Precautions Precautions: Fall Required Braces or Orthoses: Other Brace Other Brace: R ankle brace due to chronic ankle instability      Mobility  Bed Mobility Overal bed mobility: Needs Assistance Bed Mobility: Supine to Sit     Supine to sit: Supervision;HOB elevated     General bed mobility comments: supervision for safety, +rail  Transfers Overall transfer level: Needs assistance Equipment used: None Transfers: Sit to/from [facility] Sit to Stand: Min guard Stand pivot transfers: Min assist       General transfer comment: UE support provided for pivot transfer to recliner.  Ambulation/Gait             General Gait Details: unable  to assess. Awaiting R ankle brace that pt wears due to chronic ankle instability. Wife to bring later this morning.  Stairs            Wheelchair Mobility    Modified Rankin (Stroke Patients Only)       Balance Overall balance assessment: Needs assistance Sitting-balance support: No upper extremity supported;Feet supported Sitting balance-Leahy Scale: Good     Standing balance support: Bilateral upper extremity supported;During functional activity Standing balance-Leahy Scale: Fair                               Pertinent Vitals/Pain Pain Assessment: Faces Faces Pain Scale: Hurts a little bit Pain Location: R groin Pain Descriptors / Indicators: Sore Pain Intervention(s): Repositioned;Monitored during session    Home Living Family/patient expects to be discharged to:: Private residence Living Arrangements: Spouse/significant other Available Help at Discharge: Family;Available 24 hours/day Type of Home: House Home Access: Stairs to enter Entrance Stairs-Rails: Psychiatric nurse of Steps: 3 Home Layout: One level Home Equipment: Cane - single point;Shower seat      Prior Function Level of Independence: Independent with assistive device(s)         Comments: ambulates with cane and R ankle brace     Hand Dominance   Dominant Hand: Right    Extremity/Trunk Assessment   Upper Extremity Assessment Upper Extremity Assessment: Overall WFL for tasks assessed    Lower Extremity Assessment Lower Extremity Assessment: Overall WFL for tasks assessed  Communication   Communication: No difficulties  Cognition Arousal/Alertness: Awake/alert Behavior During Therapy: WFL for tasks assessed/performed Overall Cognitive Status: Within Functional Limits for tasks assessed                                        General Comments General comments (skin integrity, edema, etc.): reports no dizziness with mobility     Exercises     Assessment/Plan    PT Assessment Patient needs continued PT services  PT Problem List Decreased mobility;Decreased activity tolerance;Pain;Decreased balance       PT Treatment Interventions DME instruction;Therapeutic activities;Gait training;Therapeutic exercise;Patient/family education;Balance training;Stair training;Functional mobility training    PT Goals (Current goals can be found in the Care Plan section)  Acute Rehab PT Goals Patient Stated Goal: home PT Goal Formulation: With patient Time For Goal Achievement: 02/26/19 Potential to Achieve Goals: Good    Frequency Min 3X/week   Barriers to discharge        Co-evaluation               AM-PAC PT "6 Clicks" Mobility  Outcome Measure Help needed turning from your back to your side while in a flat bed without using bedrails?: A Little Help needed moving from lying on your back to sitting on the side of a flat bed without using bedrails?: A Little Help needed moving to and from a bed to a chair (including a wheelchair)?: A Little Help needed standing up from a chair using your arms (e.g., wheelchair or bedside chair)?: A Little Help needed to walk in hospital room?: A Little Help needed climbing 3-5 steps with a railing? : A Lot 6 Click Score: 17    End of Session Equipment Utilized During Treatment: Gait belt Activity Tolerance: Patient tolerated treatment well Patient left: in chair;with call bell/phone within reach Nurse Communication: Mobility status PT Visit Diagnosis: Unsteadiness on feet (R26.81)    Time: 1751-0258 PT Time Calculation (min) (ACUTE ONLY): 17 min   Charges:   PT Evaluation $PT Eval Moderate Complexity: 1 Mod          Aida Raider, PT  Office # [phone] Pager [phone]   Ilda Foil 02/12/2019, 9:29 AM

## 2019-02-12 NOTE — Progress Notes (Signed)
PT Cancellation Note  Patient Details Name: Spencer Shelton Sr. MRN: [id number] DOB: [birth year]   Cancelled Treatment:    Reason Eval/Treat Not Completed: Fatigue/lethargy limiting ability to participate. Attempted second PT session to assess gait. R ankle brace unavailable during PT eval this AM. Wife has now arrived with brace. Pt in bed sleeping but easily roused, declining gait trial at this time. PT to re-attempt as time allows.   Lorriane Shire 02/12/2019, 1:21 PM   Lorrin Goodell, PT  Office # [phone] Pager [phone]

## 2019-02-13 DIAGNOSIS — T148XXA Other injury of unspecified body region, initial encounter: Secondary | ICD-10-CM

## 2019-02-13 DIAGNOSIS — I5022 Chronic systolic (congestive) heart failure: Secondary | ICD-10-CM

## 2019-02-13 LAB — PROTIME-INR
INR: 1.6 — ABNORMAL HIGH (ref 0.8–1.2)
Prothrombin Time: 18.5 seconds — ABNORMAL HIGH (ref 11.4–15.2)

## 2019-02-13 LAB — BASIC METABOLIC PANEL
Anion gap: 6 (ref 5–15)
BUN: 32 mg/dL — ABNORMAL HIGH (ref 8–23)
CO2: 24 mmol/L (ref 22–32)
Calcium: 8.7 mg/dL — ABNORMAL LOW (ref 8.9–10.3)
Chloride: 109 mmol/L (ref 98–111)
Creatinine, Ser: 1.34 mg/dL — ABNORMAL HIGH (ref 0.61–1.24)
GFR calc Af Amer: >60 mL/min (ref >60)
GFR calc non Af Amer: 52 mL/min — ABNORMAL LOW (ref >60)
Glucose, Bld: 130 mg/dL — ABNORMAL HIGH (ref 70–99)
Potassium: 4.1 mmol/L (ref 3.5–5.1)
Sodium: 139 mmol/L (ref 135–145)

## 2019-02-13 LAB — GLUCOSE, CAPILLARY
Glucose-Capillary: 133 mg/dL — ABNORMAL HIGH (ref 70–99)
Glucose-Capillary: 199 mg/dL — ABNORMAL HIGH (ref 70–99)
Glucose-Capillary: 151 mg/dL — ABNORMAL HIGH (ref 70–99)
Glucose-Capillary: 182 mg/dL — ABNORMAL HIGH (ref 70–99)

## 2019-02-13 MED ORDER — FUROSEMIDE 80 MG PO TABS
80.0000 mg | ORAL_TABLET | Freq: Two times a day (BID) | ORAL | Status: DC
  Administered 2019-02-14 – 2019-02-15 (×3): 80 mg via ORAL
  Filled 2019-02-13 (×3): qty 1

## 2019-02-13 MED ORDER — FUROSEMIDE 80 MG PO TABS: 80.0000 mg | ORAL_TABLET | Freq: Two times a day (BID) | ORAL | Status: DC

## 2019-02-13 NOTE — Progress Notes (Signed)
Physical Therapy Treatment Patient Details Name: Spencer VALBUENA Sr. MRN: [id number] DOB: [birth year] Today's Date: 02/13/2019    History of Present Illness Pt is a 73 y.o. male who underwent a right inguinal hernia repair on Monday 02/08/19. He was discharged home 02/09/19. He fell at home due to dizziness. He presented to the ED 02/11/19 with c/o dizziness and weakness. Hgb 6.8. He underwent right groin hematoma evacuation 02/11/19 and received 2 units PRBC.    PT Comments    Pt reporting soreness in right groin, but overall good pain control. Ambulating 200 feet with use of IV pole for support (typically uses cane for ambulation) with right ankle brace donned. Pt denies increased pain with weightbearing. Education provided re: cryotherapy, elevation, activity recommendations. Do not anticipate need for follow up PT. Will continue to follow acutely.     Follow Up Recommendations  No PT follow up;Supervision for mobility/OOB     Equipment Recommendations  None recommended by PT    Recommendations for Other Services       Precautions / Restrictions Precautions Precautions: Fall Required Braces or Orthoses: Other Brace Other Brace: R ankle brace due to chronic ankle instability Restrictions Weight Bearing Restrictions: Yes RLE Weight Bearing: Weight bearing as tolerated    Mobility  Bed Mobility Overal bed mobility: Modified Independent                Transfers Overall transfer level: Needs assistance Equipment used: None Transfers: Sit to/from Stand Sit to Stand: Supervision            Ambulation/Gait Ambulation/Gait assistance: Supervision Gait Distance (Feet): 200 Feet Assistive device: IV Pole Gait Pattern/deviations: Step-through pattern;Trunk flexed;Decreased dorsiflexion - right     General Gait Details: Pt with noted right foot external rotation (baseline), relying on IV pole for support.    Stairs             Wheelchair Mobility    Modified  Rankin (Stroke Patients Only)       Balance Overall balance assessment: Needs assistance Sitting-balance support: No upper extremity supported;Feet supported Sitting balance-Leahy Scale: Good     Standing balance support: Bilateral upper extremity supported;During functional activity Standing balance-Leahy Scale: Fair                              Cognition Arousal/Alertness: Awake/alert Behavior During Therapy: WFL for tasks assessed/performed Overall Cognitive Status: Within Functional Limits for tasks assessed                                        Exercises      General Comments        Pertinent Vitals/Pain Pain Assessment: Faces Faces Pain Scale: Hurts little more Pain Location: R groin Pain Descriptors / Indicators: Sore Pain Intervention(s): Monitored during session    Home Living                      Prior Function            PT Goals (current goals can now be found in the care plan section) Acute Rehab PT Goals Patient Stated Goal: home Potential to Achieve Goals: Good Progress towards PT goals: Progressing toward goals    Frequency    Min 3X/week      PT Plan Current plan remains appropriate  Co-evaluation              AM-PAC PT "6 Clicks" Mobility   Outcome Measure  Help needed turning from your back to your side while in a flat bed without using bedrails?: None Help needed moving from lying on your back to sitting on the side of a flat bed without using bedrails?: None Help needed moving to and from a bed to a chair (including a wheelchair)?: None Help needed standing up from a chair using your arms (e.g., wheelchair or bedside chair)?: None Help needed to walk in hospital room?: None Help needed climbing 3-5 steps with a railing? : A Little 6 Click Score: 23    End of Session   Activity Tolerance: Patient tolerated treatment well Patient left: in bed;with call bell/phone within  reach Nurse Communication: Mobility status PT Visit Diagnosis: Unsteadiness on feet (R26.81)     Time: 4680-3212 PT Time Calculation (min) (ACUTE ONLY): 27 min  Charges:  $Therapeutic Activity: 23-37 mins                     Laurina Bustle, PT, DPT Acute Rehabilitation Services Pager [phone] Office [phone]    Vanetta Mulders 02/13/2019, 10:02 AM

## 2019-02-13 NOTE — Progress Notes (Signed)
Patient ID: Spencer Burnet Sr., male   DOB: 1945/11/05, 73 y.o.   MRN: [id number] [facility] Progress Note:   2 Days Post-Op  Subjective: Mental status is reasonable alert and explains what is going on with him Objective: Vital signs in last 24 hours: Temp:  [97.4 F (36.3 C)-97.8 F (36.6 C)] 97.6 F (36.4 C) (08/29 0741) Pulse Rate:  [69-88] 84 (08/29 0741) Resp:  [15-20] 16 (08/29 0741) BP: (110-156)/(62-71) 156/71 (08/29 0741) SpO2:  [94 %-100 %] 95 % (08/29 0741) Weight:  [93.8 kg] 93.8 kg (08/29 0500)  Intake/Output from previous day: 08/28 0701 - 08/29 0700 In: 2111.6 [P.O.:940; I.V.:1171.6] Out: -  Intake/Output this shift: No intake/output data recorded.  Physical Exam: Work of breathing is not labored.  Incision closed with surrounding purple ecchymosis.    Lab Results:  Results for orders placed or performed during the hospital encounter of 02/11/19 (from the past 48 hour(s))  CBC with Differential     Status: Abnormal   Collection Time: 02/11/19 12:50 PM  Result Value Ref Range   WBC 18.5 (H) 4.0 - 10.5 K/uL   RBC 2.04 (L) 4.22 - 5.81 MIL/uL   Hemoglobin 6.8 (LL) 13.0 - 17.0 g/dL    Comment: REPEATED TO VERIFY THIS CRITICAL RESULT HAS VERIFIED AND BEEN CALLED TO AUDREY MCKEOWN,RN BY ZELDA BEECH ON 08 27 2020 AT 1449, AND HAS BEEN READ BACK.     HCT 20.3 (L) 39.0 - 52.0 %   MCV 99.5 80.0 - 100.0 fL   MCH 33.3 26.0 - 34.0 pg   MCHC 33.5 30.0 - 36.0 g/dL   RDW 13.9 11.5 - 15.5 %   Platelets 125 (L) 150 - 400 K/uL    Comment: Immature Platelet Fraction may be clinically indicated, consider ordering this additional test [id number]    nRBC 0.0 0.0 - 0.2 %   Neutrophils Relative % 69 %   Neutro Abs 12.6 (H) 1.7 - 7.7 K/uL   Lymphocytes Relative 13 %   Lymphs Abs 2.4 0.7 - 4.0 K/uL   Monocytes Relative 17 %   Monocytes Absolute 3.2 (H) 0.1 - 1.0 K/uL   Eosinophils Relative 1 %   Eosinophils Absolute 0.2 0.0 - 0.5 K/uL   Basophils Relative 0 %    Basophils Absolute 0.1 0.0 - 0.1 K/uL   Immature Granulocytes 0 %   Abs Immature Granulocytes 0.07 0.00 - 0.07 K/uL    Comment: Performed at [facility], [address] [place], [place] [address]  Basic metabolic panel     Status: Abnormal   Collection Time: 02/11/19 12:50 PM  Result Value Ref Range   Sodium 131 (L) 135 - 145 mmol/L   Potassium 3.8 3.5 - 5.1 mmol/L   Chloride 98 98 - 111 mmol/L   CO2 23 22 - 32 mmol/L   Glucose, Bld 218 (H) 70 - 99 mg/dL   BUN 51 (H) 8 - 23 mg/dL   Creatinine, Ser 2.51 (H) 0.61 - 1.24 mg/dL   Calcium 8.8 (L) 8.9 - 10.3 mg/dL   GFR calc non Af Amer 24 (L) >60 mL/min   GFR calc Af Amer 28 (L) >60 mL/min   Anion gap 10 5 - 15    Comment: Performed at [facility] [address], [place], [place] [address]  Protime-INR     Status: Abnormal   Collection Time: 02/11/19 12:50 PM  Result Value Ref Range   Prothrombin Time 24.0 (H) 11.4 -  15.2 seconds   INR 2.2 (H) 0.8 - 1.2    Comment: (NOTE) INR goal varies based on device and disease states. Performed at [facility], [address] [place], [place] [address]   Type and screen [facility]     Status: None   Collection Time: 02/11/19  3:42 PM  Result Value Ref Range   ABO/RH(D) O POS    Antibody Screen NEG    Sample Expiration 02/14/2019,2359    Unit Number [id number]    Blood Component Type RED CELLS,LR    Unit division 00    Status of Unit ISSUED,FINAL    Transfusion Status OK TO TRANSFUSE    Crossmatch Result      Compatible Performed at [facility], [address] [place], [place] [address]    Unit Number [id number]    Blood Component Type RED CELLS,LR    Unit division 00    Status of Unit ISSUED,FINAL    Transfusion Status OK TO TRANSFUSE    Crossmatch Result Compatible    Unit Number [id number]    Blood Component Type RED CELLS,LR    Unit division 00    Status of Unit ISSUED,FINAL    Transfusion Status OK TO TRANSFUSE     Crossmatch Result Compatible   Prepare RBC     Status: None   Collection Time: 02/11/19  3:42 PM  Result Value Ref Range   Order Confirmation      ORDER PROCESSED BY BLOOD BANK Performed at [facility], [address] [place], [place] [address]   ABO/Rh     Status: None   Collection Time: 02/11/19  3:42 PM  Result Value Ref Range   ABO/RH(D)      O POS Performed at [facility], [address] [place], [place] [address]   SARS Coronavirus 2 [facility](Hospital order, Performed in [facility] hospital lab) Nasopharyngeal Nasopharyngeal Swab     Status: None   Collection Time: 02/11/19  3:55 PM   Specimen: Nasopharyngeal Swab  Result Value Ref Range   SARS Coronavirus 2 NEGATIVE NEGATIVE    Comment: (NOTE) If result is NEGATIVE SARS-CoV-2 target nucleic acids are NOT DETECTED. The SARS-CoV-2 RNA is generally detectable in upper and lower  respiratory specimens during the acute phase of infection. The lowest  concentration of SARS-CoV-2 viral copies this assay can detect is 250  copies / mL. A negative result does not preclude SARS-CoV-2 infection  and should not be used as the sole basis for treatment or other  patient management decisions.  A negative result may occur with  improper specimen collection / handling, submission of specimen other  than nasopharyngeal swab, presence of viral mutation(s) within the  areas targeted by this assay, and inadequate number of viral copies  (<250 copies / mL). A negative result must be combined with clinical  observations, patient history, and epidemiological information. If result is POSITIVE SARS-CoV-2 target nucleic acids are DETECTED. The SARS-CoV-2 RNA is generally detectable in upper and lower  respiratory specimens dur ing the acute phase of infection.  Positive  results are indicative of active infection with SARS-CoV-2.  Clinical  correlation with patient history and other diagnostic information is  necessary to determine  patient infection status.  Positive results do  not rule out bacterial infection or co-infection with other viruses. If result is PRESUMPTIVE POSTIVE SARS-CoV-2 nucleic acids MAY BE PRESENT.   A presumptive positive result was obtained on  the submitted specimen  and confirmed on repeat testing.  While 2019 novel coronavirus  (SARS-CoV-2) nucleic acids may be present in the submitted sample  additional confirmatory testing may be necessary for epidemiological  and / or clinical management purposes  to differentiate between  SARS-CoV-2 and other Sarbecovirus currently known to infect humans.  If clinically indicated additional testing with an alternate test  methodology [phone]([id number]) is advised. The SARS-CoV-2 RNA is generally  detectable in upper and lower respiratory sp ecimens during the acute  phase of infection. The expected result is Negative. Fact Sheet for Patients:  BoilerBrush.com.cyhttps://www.fda.gov/media/136312/download Fact Sheet for Healthcare Providers: https://pope.com/https://www.fda.gov/media/136313/download This test is not yet approved or cleared by the [place] FDA and has been authorized for detection and/or diagnosis of SARS-CoV-2 by FDA under an Emergency Use Authorization (EUA).  This EUA will remain in effect (meaning this test can be used) for the duration of the COVID-19 declaration under Section 564(b)(1) of the Act, 21 U.S.C. section 360bbb-3(b)(1), unless the authorization is terminated or revoked sooner. Performed at [facility], [address] [place], [place] [address]   Protime-INR     Status: Abnormal   Collection Time: 02/11/19  5:03 PM  Result Value Ref Range   Prothrombin Time 17.4 (H) 11.4 - 15.2 seconds   INR 1.5 (H) 0.8 - 1.2    Comment: (NOTE) INR goal varies based on device and disease states. Performed at [facility], [address] [place], [place] [address]   Glucose, capillary     Status: Abnormal   Collection Time: 02/11/19  8:14 PM  Result  Value Ref Range   Glucose-Capillary 201 (H) 70 - 99 mg/dL  Glucose, capillary     Status: Abnormal   Collection Time: 02/11/19  9:49 PM  Result Value Ref Range   Glucose-Capillary 181 (H) 70 - 99 mg/dL   Comment 1 Notify RN    Comment 2 Document in Chart   Basic metabolic panel     Status: Abnormal   Collection Time: 02/11/19 10:53 PM  Result Value Ref Range   Sodium 136 135 - 145 mmol/L   Potassium 4.2 3.5 - 5.1 mmol/L   Chloride 102 98 - 111 mmol/L   CO2 26 22 - 32 mmol/L   Glucose, Bld 190 (H) 70 - 99 mg/dL   BUN 44 (H) 8 - 23 mg/dL   Creatinine, Ser 4.781.74 (H) 0.61 - 1.24 mg/dL   Calcium 8.9 8.9 - 29.510.3 mg/dL   GFR calc non Af Amer 38 (L) >60 mL/min   GFR calc Af Amer 44 (L) >60 mL/min   Anion gap 8 5 - 15    Comment: Performed at [facility], [address] [place], [place] [address]  CBC     Status: Abnormal   Collection Time: 02/11/19 10:53 PM  Result Value Ref Range   WBC 13.9 (H) 4.0 - 10.5 K/uL   RBC 3.13 (L) 4.22 - 5.81 MIL/uL   Hemoglobin 10.3 (L) 13.0 - 17.0 g/dL    Comment: REPEATED TO VERIFY POST TRANSFUSION SPECIMEN    HCT 29.8 (L) 39.0 - 52.0 %   MCV 95.2 80.0 - 100.0 fL   MCH 32.9 26.0 - 34.0 pg   MCHC 34.6 30.0 - 36.0 g/dL   RDW 86.513.7 78.411.5 - 69.615.5 %   Platelets 112 (L) 150 - 400 K/uL    Comment: REPEATED TO VERIFY PLATELET COUNT CONFIRMED BY SMEAR SPECIMEN CHECKED FOR CLOTS Immature Platelet Fraction  may be clinically indicated, consider ordering this additional test [id number]    nRBC 0.0 0.0 - 0.2 %    Comment: Performed at [facility], [address] [place], [place] [address]  CBC     Status: Abnormal   Collection Time: 02/12/19  3:04 AM  Result Value Ref Range   WBC 12.4 (H) 4.0 - 10.5 K/uL   RBC 2.87 (L) 4.22 - 5.81 MIL/uL   Hemoglobin 9.4 (L) 13.0 - 17.0 g/dL    Comment: REPEATED TO VERIFY   HCT 27.6 (L) 39.0 - 52.0 %   MCV 96.2 80.0 - 100.0 fL   MCH 32.8 26.0 - 34.0 pg   MCHC 34.1 30.0 - 36.0 g/dL   RDW 11.913.8 14.711.5 - 82.915.5 %    Platelets 101 (L) 150 - 400 K/uL    Comment: REPEATED TO VERIFY Immature Platelet Fraction may be clinically indicated, consider ordering this additional test [id number] CONSISTENT WITH PREVIOUS RESULT    nRBC 0.0 0.0 - 0.2 %    Comment: Performed at [facility], [address] [place], [place] [address]  Protime-INR     Status: Abnormal   Collection Time: 02/12/19  3:04 AM  Result Value Ref Range   Prothrombin Time 18.2 (H) 11.4 - 15.2 seconds   INR 1.5 (H) 0.8 - 1.2    Comment: (NOTE) INR goal varies based on device and disease states. Performed at [facility], [address] [place], [place] [address]   Basic metabolic panel     Status: Abnormal   Collection Time: 02/12/19  3:04 AM  Result Value Ref Range   Sodium 137 135 - 145 mmol/L   Potassium 3.9 3.5 - 5.1 mmol/L   Chloride 104 98 - 111 mmol/L   CO2 24 22 - 32 mmol/L   Glucose, Bld 147 (H) 70 - 99 mg/dL   BUN 41 (H) 8 - 23 mg/dL   Creatinine, Ser 2.841.65 (H) 0.61 - 1.24 mg/dL   Calcium 8.6 (L) 8.9 - 10.3 mg/dL   GFR calc non Af Amer 41 (L) >60 mL/min   GFR calc Af Amer 47 (L) >60 mL/min   Anion gap 9 5 - 15    Comment: Performed at [facility], [address] [place], [place] [address]  Glucose, capillary     Status: Abnormal   Collection Time: 02/12/19  8:18 AM  Result Value Ref Range   Glucose-Capillary 125 (H) 70 - 99 mg/dL  Glucose, capillary     Status: Abnormal   Collection Time: 02/12/19 12:18 PM  Result Value Ref Range   Glucose-Capillary 194 (H) 70 - 99 mg/dL  Glucose, capillary     Status: Abnormal   Collection Time: 02/12/19  4:32 PM  Result Value Ref Range   Glucose-Capillary 162 (H) 70 - 99 mg/dL  Glucose, capillary     Status: Abnormal   Collection Time: 02/12/19  9:22 PM  Result Value Ref Range   Glucose-Capillary 137 (H) 70 - 99 mg/dL   Comment 1 Notify RN    Comment 2 Document in Chart   CBC     Status: Abnormal   Collection Time: 02/12/19 11:15 PM  Result Value Ref  Range   WBC 10.5 4.0 - 10.5 K/uL   RBC 2.83 (L) 4.22 - 5.81 MIL/uL   Hemoglobin 9.2 (L) 13.0 - 17.0 g/dL   HCT 01.027.4 (L) 27.239.0 - 53.652.0 %   MCV 96.8 80.0 - 100.0 fL  MCH 32.5 26.0 - 34.0 pg   MCHC 33.6 30.0 - 36.0 g/dL   RDW 67.5 44.9 - 20.1 %   Platelets 115 (L) 150 - 400 K/uL    Comment: REPEATED TO VERIFY Immature Platelet Fraction may be clinically indicated, consider ordering this additional test [id number] CONSISTENT WITH PREVIOUS RESULT    nRBC 0.0 0.0 - 0.2 %    Comment: Performed at [facility], [address], [place], [place] [address]  Protime-INR     Status: Abnormal   Collection Time: 02/13/19 12:11 AM  Result Value Ref Range   Prothrombin Time 18.5 (H) 11.4 - 15.2 seconds   INR 1.6 (H) 0.8 - 1.2    Comment: (NOTE) INR goal varies based on device and disease states. Performed at [facility], [address], [place], [place] [address]   Basic metabolic panel     Status: Abnormal   Collection Time: 02/13/19 12:11 AM  Result Value Ref Range   Sodium 139 135 - 145 mmol/L   Potassium 4.1 3.5 - 5.1 mmol/L   Chloride 109 98 - 111 mmol/L   CO2 24 22 - 32 mmol/L   Glucose, Bld 130 (H) 70 - 99 mg/dL   BUN 32 (H) 8 - 23 mg/dL   Creatinine, Ser 6.41 (H) 0.61 - 1.24 mg/dL   Calcium 8.7 (L) 8.9 - 10.3 mg/dL   GFR calc non Af Amer 52 (L) >60 mL/min   GFR calc Af Amer >60 >60 mL/min   Anion gap 6 5 - 15    Comment: Performed at [facility], [address] [place], [place] [address]  Glucose, capillary     Status: Abnormal   Collection Time: 02/13/19  7:43 AM  Result Value Ref Range   Glucose-Capillary 133 (H) 70 - 99 mg/dL    Radiology/Results: Dg Chest 2 View  Result Date: 02/11/2019 CLINICAL DATA:  Dizziness.  Recent hernia surgery. 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 LEFT lung and pleuroparenchymal scarring again noted. There is no evidence of focal airspace  disease, pulmonary edema, suspicious pulmonary nodule/mass, pleural effusion, or pneumothorax. No acute bony abnormalities are identified. IMPRESSION: No evidence of acute cardiopulmonary disease. Electronically Signed   By: Harmon Pier M.D.   On: 02/11/2019 13:22   Ct Head Wo Contrast  Result Date: 02/11/2019 CLINICAL DATA:  Head injury. EXAM: CT HEAD WITHOUT CONTRAST TECHNIQUE: Contiguous axial images were obtained from the base of the skull through the vertex without intravenous contrast. COMPARISON:  None. FINDINGS: Brain: Mild diffuse cortical atrophy is noted. Mild chronic ischemic white matter disease is noted. No mass effect or midline shift is noted. Ventricular size is within normal limits. There is no evidence of mass lesion, hemorrhage or acute infarction. Vascular: No hyperdense vessel or unexpected calcification. Skull: Normal. Negative for fracture or focal lesion. Sinuses/Orbits: No acute finding. Other: None. IMPRESSION: Mild diffuse cortical atrophy. Mild chronic ischemic white matter disease. No acute intracranial abnormality seen. Electronically Signed   By: Lupita Raider M.D.   On: 02/11/2019 14:28    Anti-infectives: Anti-infectives (From admission, onward)   Start     Dose/Rate Route Frequency Ordered Stop   02/12/19 1000  doxycycline (VIBRA-TABS) tablet 100 mg     100 mg Oral Every 12 hours 02/11/19 2041     02/11/19 1920  ciprofloxacin (CIPRO) 400 MG/200ML IVPB    Note to Pharmacy: Victorino December,  Samantha   : cabinet override      02/11/19 1920 02/11/19 1920   02/11/19 1530  ciprofloxacin (CIPRO) IVPB 400 mg     400 mg 200 mL/hr over 60 Minutes Intravenous To Surgery 02/11/19 1513 02/11/19 2020      Assessment/Plan: Problem List: Patient Active Problem List   Diagnosis Date Noted  . Pressure injury of skin 02/12/2019  . Hematoma 02/11/2019  . Hematoma of groin 02/11/2019  . Right inguinal hernia 02/08/2019  . Ascites leaking out umbilical incision 12/30/2013  .  Calculus of gallbladder with acute cholecystitis s/p lap chole 12/22/2013 12/22/2013  . CAD (coronary artery disease), native coronary artery 10/26/2013  . Hyperlipidemia   . Cirrhosis (HCC)   . Ventricular tachycardia (paroxysmal) (HCC) 09/16/2013  . Biventricular implantable cardioverter-defibrillator in situ 04/16/2011  . Essential hypertension, benign 03/28/2010  . Atrial fibrillation (HCC) 03/28/2010  . Chronic systolic heart failure (HCC) 03/28/2010    Appears stable after bleed into [facility] repair.   2 Days Post-Op    LOS: 2 days   Matt B. Daphine Deutscher, MD, [facility] [phone] beeper [phone]  02/13/2019 10:41 AM

## 2019-02-13 NOTE — Plan of Care (Signed)

## 2019-02-13 NOTE — Progress Notes (Signed)
DAILY PROGRESS NOTE   Patient Name: Spencer Shelton. Date of Encounter: 02/13/2019 Cardiologist: No primary care provider on file.  Chief Complaint   No worsening shortness of breath  Patient Profile   Spencer Shelton. is a 73 y.o. male with a hx of CHB s/p BiV AICD, chronic systolic heart failure/ischemic cardiomyopathy, CAD s/p CABG in 1982 and redo in 1996, hypertension, hyperlipidemia, type 2 diabetes who is being seen today status post inguinal hernia repair with subsequent hematoma while on coumadin.  Dr. Dwain Sarna is requesting cardiology input on medication management.  Subjective   Vitals stable overnight - creatinine improved to 1.34 today - holding ACE-I. Hemoglobin stable at 9.2. INR 1.6. Weight is recorded up 3 kg - he is +2L overnight, almost +4L IN recorded.   Objective   Vitals:   02/13/19 0002 02/13/19 0403 02/13/19 0500 02/13/19 0741  BP: 114/64 (!) 142/70  (!) 156/71  Pulse:  73  84  Resp:  19  16  Temp:  97.7 F (36.5 C)  97.6 F (36.4 C)  TempSrc:  Oral  Oral  SpO2:  94%  95%  Weight:   93.8 kg   Height:        Intake/Output Summary (Last 24 hours) at 02/13/2019 1037 Last data filed at 02/13/2019 0300 Gross per 24 hour  Intake 1431.76 ml  Output -  Net 1431.76 ml    Filed Weights   02/11/19 1159 02/11/19 2104 02/13/19 0500  Weight: 86.2 kg 90.5 kg 93.8 kg    Physical Exam   General appearance: alert and no distress Neck: no carotid bruit, no JVD and thyroid not enlarged, symmetric, no tenderness/mass/nodules Lungs: clear to auscultation bilaterally Heart: regular rate and rhythm Abdomen: soft, non-tender; bowel sounds normal; no masses,  no organomegaly Extremities: extremities normal, atraumatic, no cyanosis or edema Pulses: 2+ and symmetric Skin: Skin color, texture, turgor normal. No rashes or lesions Neurologic: Grossly normal Psych: Pleasant  Inpatient Medications    Scheduled Meds: . acetaminophen  1,000 mg Oral Q6H  .  brimonidine  1 drop Both Eyes BID   And  . timolol  1 drop Both Eyes BID  . docusate sodium  100 mg Oral BID  . doxycycline  100 mg Oral Q12H  . ezetimibe  10 mg Oral Daily  . insulin aspart  0-15 Units Subcutaneous TID WC  . insulin aspart  0-5 Units Subcutaneous QHS  . latanoprost  1 drop Both Eyes QHS  . metoprolol succinate  25 mg Oral Daily    Continuous Infusions: . 0.9 % NaCl with KCl 20 mEq / L 50 mL/hr at 02/13/19 0601    PRN Meds: diphenhydrAMINE **OR** diphenhydrAMINE, morphine injection, nitroGLYCERIN, ondansetron **OR** ondansetron (ZOFRAN) IV, polyethylene glycol, simethicone, traMADol   Labs   Results for orders placed or performed during the hospital encounter of 02/11/19 (from the past 48 hour(s))  CBC with Differential     Status: Abnormal   Collection Time: 02/11/19 12:50 PM  Result Value Ref Range   WBC 18.5 (H) 4.0 - 10.5 K/uL   RBC 2.04 (L) 4.22 - 5.81 MIL/uL   Hemoglobin 6.8 (LL) 13.0 - 17.0 g/dL    Comment: REPEATED TO VERIFY THIS CRITICAL RESULT HAS VERIFIED AND BEEN CALLED TO AUDREY MCKEOWN,RN BY ZELDA BEECH ON 08 27 2020 AT 1449, AND HAS BEEN READ BACK.     HCT 20.3 (L) 39.0 - 52.0 %   MCV 99.5 80.0 - 100.0 fL  MCH 33.3 26.0 - 34.0 pg   MCHC 33.5 30.0 - 36.0 g/dL   RDW 16.1 09.6 - 04.5 %   Platelets 125 (L) 150 - 400 K/uL    Comment: Immature Platelet Fraction may be clinically indicated, consider ordering this additional test [id number]    nRBC 0.0 0.0 - 0.2 %   Neutrophils Relative % 69 %   Neutro Abs 12.6 (H) 1.7 - 7.7 K/uL   Lymphocytes Relative 13 %   Lymphs Abs 2.4 0.7 - 4.0 K/uL   Monocytes Relative 17 %   Monocytes Absolute 3.2 (H) 0.1 - 1.0 K/uL   Eosinophils Relative 1 %   Eosinophils Absolute 0.2 0.0 - 0.5 K/uL   Basophils Relative 0 %   Basophils Absolute 0.1 0.0 - 0.1 K/uL   Immature Granulocytes 0 %   Abs Immature Granulocytes 0.07 0.00 - 0.07 K/uL    Comment: Performed at [facility], [address],  [place], [place] [address]  Basic metabolic panel     Status: Abnormal   Collection Time: 02/11/19 12:50 PM  Result Value Ref Range   Sodium 131 (L) 135 - 145 mmol/L   Potassium 3.8 3.5 - 5.1 mmol/L   Chloride 98 98 - 111 mmol/L   CO2 23 22 - 32 mmol/L   Glucose, Bld 218 (H) 70 - 99 mg/dL   BUN 51 (H) 8 - 23 mg/dL   Creatinine, Ser 5.62 (H) 0.61 - 1.24 mg/dL   Calcium 8.8 (L) 8.9 - 10.3 mg/dL   GFR calc non Af Amer 24 (L) >60 mL/min   GFR calc Af Amer 28 (L) >60 mL/min   Anion gap 10 5 - 15    Comment: Performed at [facility], [address] [place], [place] [address]  Protime-INR     Status: Abnormal   Collection Time: 02/11/19 12:50 PM  Result Value Ref Range   Prothrombin Time 24.0 (H) 11.4 - 15.2 seconds   INR 2.2 (H) 0.8 - 1.2    Comment: (NOTE) INR goal varies based on device and disease states. Performed at [facility], [address], [place], [place] [address]   Type and screen [facility]     Status: None   Collection Time: 02/11/19  3:42 PM  Result Value Ref Range   ABO/RH(D) O POS    Antibody Screen NEG    Sample Expiration 02/14/2019,2359    Unit Number [id number]    Blood Component Type RED CELLS,LR    Unit division 00    Status of Unit ISSUED,FINAL    Transfusion Status OK TO TRANSFUSE    Crossmatch Result      Compatible Performed at [facility], [address] [place], [place] [address]    Unit Number [id number]    Blood Component Type RED CELLS,LR    Unit division 00    Status of Unit ISSUED,FINAL    Transfusion Status OK TO TRANSFUSE    Crossmatch Result Compatible    Unit Number [id number]    Blood Component Type RED CELLS,LR    Unit division 00    Status of Unit ISSUED,FINAL    Transfusion Status OK TO TRANSFUSE    Crossmatch Result Compatible   Prepare RBC     Status: None   Collection Time: 02/11/19  3:42 PM  Result Value Ref Range   Order Confirmation      ORDER PROCESSED BY BLOOD BANK  Performed at [facility], [address], [place], [place] [address]   ABO/Rh     Status: None   Collection Time: 02/11/19  3:42 PM  Result Value Ref Range   ABO/RH(D)      O POS Performed at [facility], [address] [place], [place] [address]   SARS Coronavirus 2 [facility] order, Performed in [facility] hospital lab) Nasopharyngeal Nasopharyngeal Swab     Status: None   Collection Time: 02/11/19  3:55 PM   Specimen: Nasopharyngeal Swab  Result Value Ref Range   SARS Coronavirus 2 NEGATIVE NEGATIVE    Comment: (NOTE) If result is NEGATIVE SARS-CoV-2 target nucleic acids are NOT DETECTED. The SARS-CoV-2 RNA is generally detectable in upper and lower  respiratory specimens during the acute phase of infection. The lowest  concentration of SARS-CoV-2 viral copies this assay can detect is 250  copies / mL. A negative result does not preclude SARS-CoV-2 infection  and should not be used as the sole basis for treatment or other  patient management decisions.  A negative result may occur with  improper specimen collection / handling, submission of specimen other  than nasopharyngeal swab, presence of viral mutation(s) within the  areas targeted by this assay, and inadequate number of viral copies  (<250 copies / mL). A negative result must be combined with clinical  observations, patient history, and epidemiological information. If result is POSITIVE SARS-CoV-2 target nucleic acids are DETECTED. The SARS-CoV-2 RNA is generally detectable in upper and lower  respiratory specimens dur ing the acute phase of infection.  Positive  results are indicative of active infection with SARS-CoV-2.  Clinical  correlation with patient history and other diagnostic information is  necessary to determine patient infection status.  Positive results do  not rule out bacterial infection or co-infection with other viruses. If result is PRESUMPTIVE POSTIVE SARS-CoV-2 nucleic acids MAY BE  PRESENT.   A presumptive positive result was obtained on the submitted specimen  and confirmed on repeat testing.  While 2019 novel coronavirus  (SARS-CoV-2) nucleic acids may be present in the submitted sample  additional confirmatory testing may be necessary for epidemiological  and / or clinical management purposes  to differentiate between  SARS-CoV-2 and other Sarbecovirus currently known to infect humans.  If clinically indicated additional testing with an alternate test  methodology [phone]) is advised. The SARS-CoV-2 RNA is generally  detectable in upper and lower respiratory sp ecimens during the acute  phase of infection. The expected result is Negative. Fact Sheet for Patients:  BoilerBrush.com.cy Fact Sheet for Healthcare Providers: https://pope.com/ This test is not yet approved or cleared by the [place] FDA and has been authorized for detection and/or diagnosis of SARS-CoV-2 by FDA under an Emergency Use Authorization (EUA).  This EUA will remain in effect (meaning this test can be used) for the duration of the COVID-19 declaration under Section 564(b)(1) of the Act, 21 U.S.C. section 360bbb-3(b)(1), unless the authorization is terminated or revoked sooner. Performed at [facility], [address] [place], [place] [address]   Protime-INR     Status: Abnormal   Collection Time: 02/11/19  5:03 PM  Result Value Ref Range   Prothrombin Time 17.4 (H) 11.4 - 15.2 seconds   INR 1.5 (H) 0.8 - 1.2    Comment: (NOTE) INR goal varies based on device and disease states. Performed at [facility], [address], [place], [place] [address]   Glucose, capillary  Status: Abnormal   Collection Time: 02/11/19  8:14 PM  Result Value Ref Range   Glucose-Capillary 201 (H) 70 - 99 mg/dL  Glucose, capillary     Status: Abnormal   Collection Time: 02/11/19  9:49 PM  Result Value Ref Range   Glucose-Capillary 181  (H) 70 - 99 mg/dL   Comment 1 Notify RN    Comment 2 Document in Chart   Basic metabolic panel     Status: Abnormal   Collection Time: 02/11/19 10:53 PM  Result Value Ref Range   Sodium 136 135 - 145 mmol/L   Potassium 4.2 3.5 - 5.1 mmol/L   Chloride 102 98 - 111 mmol/L   CO2 26 22 - 32 mmol/L   Glucose, Bld 190 (H) 70 - 99 mg/dL   BUN 44 (H) 8 - 23 mg/dL   Creatinine, Ser 1.74 (H) 0.61 - 1.24 mg/dL   Calcium 8.9 8.9 - 10.3 mg/dL   GFR calc non Af Amer 38 (L) >60 mL/min   GFR calc Af Amer 44 (L) >60 mL/min   Anion gap 8 5 - 15    Comment: Performed at [employer] [facility] [address], [place], [place] [address]  CBC     Status: Abnormal   Collection Time: 02/11/19 10:53 PM  Result Value Ref Range   WBC 13.9 (H) 4.0 - 10.5 K/uL   RBC 3.13 (L) 4.22 - 5.81 MIL/uL   Hemoglobin 10.3 (L) 13.0 - 17.0 g/dL    Comment: REPEATED TO VERIFY POST TRANSFUSION SPECIMEN    HCT 29.8 (L) 39.0 - 52.0 %   MCV 95.2 80.0 - 100.0 fL   MCH 32.9 26.0 - 34.0 pg   MCHC 34.6 30.0 - 36.0 g/dL   RDW 13.7 11.5 - 15.5 %   Platelets 112 (L) 150 - 400 K/uL    Comment: REPEATED TO VERIFY PLATELET COUNT CONFIRMED BY SMEAR SPECIMEN CHECKED FOR CLOTS Immature Platelet Fraction may be clinically indicated, consider ordering this additional test [id number]    nRBC 0.0 0.0 - 0.2 %    Comment: Performed at [facility], [place] [address], [place], [place] [address]  CBC     Status: Abnormal   Collection Time: 02/12/19  3:04 AM  Result Value Ref Range   WBC 12.4 (H) 4.0 - 10.5 K/uL   RBC 2.87 (L) 4.22 - 5.81 MIL/uL   Hemoglobin 9.4 (L) 13.0 - 17.0 g/dL    Comment: REPEATED TO VERIFY   HCT 27.6 (L) 39.0 - 52.0 %   MCV 96.2 80.0 - 100.0 fL   MCH 32.8 26.0 - 34.0 pg   MCHC 34.1 30.0 - 36.0 g/dL   RDW 13.8 11.5 - 15.5 %   Platelets 101 (L) 150 - 400 K/uL    Comment: REPEATED TO VERIFY Immature Platelet Fraction may be clinically indicated, consider ordering this additional test [id number] CONSISTENT  WITH PREVIOUS RESULT    nRBC 0.0 0.0 - 0.2 %    Comment: Performed at [facility], [place] [address], [place], [place] [address]  Protime-INR     Status: Abnormal   Collection Time: 02/12/19  3:04 AM  Result Value Ref Range   Prothrombin Time 18.2 (H) 11.4 - 15.2 seconds   INR 1.5 (H) 0.8 - 1.2    Comment: (NOTE) INR goal varies based on device and disease states. Performed at [facility], [place] [address], [place], [place] [address]   Basic metabolic panel  Status: Abnormal   Collection Time: 02/12/19  3:04 AM  Result Value Ref Range   Sodium 137 135 - 145 mmol/L   Potassium 3.9 3.5 - 5.1 mmol/L   Chloride 104 98 - 111 mmol/L   CO2 24 22 - 32 mmol/L   Glucose, Bld 147 (H) 70 - 99 mg/dL   BUN 41 (H) 8 - 23 mg/dL   Creatinine, Ser 1.911.65 (H) 0.61 - 1.24 mg/dL   Calcium 8.6 (L) 8.9 - 10.3 mg/dL   GFR calc non Af Amer 41 (L) >60 mL/min   GFR calc Af Amer 47 (L) >60 mL/min   Anion gap 9 5 - 15    Comment: Performed at [facility], [address] [place], [place] [address]  Glucose, capillary     Status: Abnormal   Collection Time: 02/12/19  8:18 AM  Result Value Ref Range   Glucose-Capillary 125 (H) 70 - 99 mg/dL  Glucose, capillary     Status: Abnormal   Collection Time: 02/12/19 12:18 PM  Result Value Ref Range   Glucose-Capillary 194 (H) 70 - 99 mg/dL  Glucose, capillary     Status: Abnormal   Collection Time: 02/12/19  4:32 PM  Result Value Ref Range   Glucose-Capillary 162 (H) 70 - 99 mg/dL  Glucose, capillary     Status: Abnormal   Collection Time: 02/12/19  9:22 PM  Result Value Ref Range   Glucose-Capillary 137 (H) 70 - 99 mg/dL   Comment 1 Notify RN    Comment 2 Document in Chart   CBC     Status: Abnormal   Collection Time: 02/12/19 11:15 PM  Result Value Ref Range   WBC 10.5 4.0 - 10.5 K/uL   RBC 2.83 (L) 4.22 - 5.81 MIL/uL   Hemoglobin 9.2 (L) 13.0 - 17.0 g/dL   HCT 56.227.4 (L) 13.039.0 - 86.552.0 %   MCV 96.8 80.0 - 100.0 fL   MCH 32.5 26.0 -  34.0 pg   MCHC 33.6 30.0 - 36.0 g/dL   RDW 78.414.1 69.611.5 - 29.515.5 %   Platelets 115 (L) 150 - 400 K/uL    Comment: REPEATED TO VERIFY Immature Platelet Fraction may be clinically indicated, consider ordering this additional test [id number] CONSISTENT WITH PREVIOUS RESULT    nRBC 0.0 0.0 - 0.2 %    Comment: Performed at [facility], [address] [place], [place] [address]  Protime-INR     Status: Abnormal   Collection Time: 02/13/19 12:11 AM  Result Value Ref Range   Prothrombin Time 18.5 (H) 11.4 - 15.2 seconds   INR 1.6 (H) 0.8 - 1.2    Comment: (NOTE) INR goal varies based on device and disease states. Performed at [facility], [address] [place], [place] [address]   Basic metabolic panel     Status: Abnormal   Collection Time: 02/13/19 12:11 AM  Result Value Ref Range   Sodium 139 135 - 145 mmol/L   Potassium 4.1 3.5 - 5.1 mmol/L   Chloride 109 98 - 111 mmol/L   CO2 24 22 - 32 mmol/L   Glucose, Bld 130 (H) 70 - 99 mg/dL   BUN 32 (H) 8 - 23 mg/dL   Creatinine, Ser 4.401.34 (H) 0.61 - 1.24 mg/dL   Calcium 8.7 (L) 8.9 - 10.3 mg/dL   GFR calc non Af Amer 52 (L) >60 mL/min   GFR calc Af Amer >60 >60 mL/min   Anion gap 6 5 -  15    Comment: Performed at [facility], [address] [place], [place] [address]  Glucose, capillary     Status: Abnormal   Collection Time: 02/13/19  7:43 AM  Result Value Ref Range   Glucose-Capillary 133 (H) 70 - 99 mg/dL    ECG   N/A  Telemetry   Paced rhythm - Personally Reviewed  Radiology    Dg Chest 2 View  Result Date: 02/11/2019 CLINICAL DATA:  Dizziness.  Recent hernia surgery. 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 LEFT lung and pleuroparenchymal scarring again noted. There is no evidence of focal airspace disease, pulmonary edema, suspicious pulmonary nodule/mass, pleural effusion, or pneumothorax. No acute bony abnormalities  are identified. IMPRESSION: No evidence of acute cardiopulmonary disease. Electronically Signed   By: Harmon PierJeffrey  Hu M.D.   On: 02/11/2019 13:22   Ct Head Wo Contrast  Result Date: 02/11/2019 CLINICAL DATA:  Head injury. EXAM: CT HEAD WITHOUT CONTRAST TECHNIQUE: Contiguous axial images were obtained from the base of the skull through the vertex without intravenous contrast. COMPARISON:  None. FINDINGS: Brain: Mild diffuse cortical atrophy is noted. Mild chronic ischemic white matter disease is noted. No mass effect or midline shift is noted. Ventricular size is within normal limits. There is no evidence of mass lesion, hemorrhage or acute infarction. Vascular: No hyperdense vessel or unexpected calcification. Skull: Normal. Negative for fracture or focal lesion. Sinuses/Orbits: No acute finding. Other: None. IMPRESSION: Mild diffuse cortical atrophy. Mild chronic ischemic white matter disease. No acute intracranial abnormality seen. Electronically Signed   By: Lupita RaiderJames  Green Jr M.D.   On: 02/11/2019 14:28    Cardiac Studies   N/A  Assessment   1. Active Problems: 2.   Hematoma 3.   Hematoma of groin 4.   Pressure injury of skin 5.   Plan   1. Hemoglobin stable - significantly net positive on IVF's, will d/c IVF's this morning. Does not complain of dyspnea - creatinine improving. Will restart home lasix tomorrow - would restart ACE-I prior to d/c if renal function returns to baseline.  Time Spent Directly with Patient:  I have spent a total of 25 minutes with the patient reviewing hospital notes, telemetry, EKGs, labs and examining the patient as well as establishing an assessment and plan that was discussed personally with the patient.  > 50% of time was spent in direct patient care.  Length of Stay:  LOS: 2 days   Chrystie NoseKenneth C. Keziah Avis, MD, [facility] CenterFACC, FACP  [place]  [facility]  Medical Director of the [facility] Lipid [facility]  Cardiovascular Risk Reduction Clinic Diplomate of the  American Board of Clinical Lipidology Attending Cardiologist  Direct Dial: [phone]  Fax: [phone]  Website:  [email or web address]  Aubrianne Molyneux C Parnika [place] 02/13/2019, 10:37 AM

## 2019-02-14 DIAGNOSIS — I2510 Atherosclerotic heart disease of native coronary artery without angina pectoris: Secondary | ICD-10-CM

## 2019-02-14 DIAGNOSIS — I4819 Other persistent atrial fibrillation: Secondary | ICD-10-CM

## 2019-02-14 DIAGNOSIS — N179 Acute kidney failure, unspecified: Secondary | ICD-10-CM

## 2019-02-14 DIAGNOSIS — S301XXD Contusion of abdominal wall, subsequent encounter: Secondary | ICD-10-CM

## 2019-02-14 DIAGNOSIS — Z95810 Presence of automatic (implantable) cardiac defibrillator: Secondary | ICD-10-CM

## 2019-02-14 LAB — PROTIME-INR
INR: 1.4 — ABNORMAL HIGH (ref 0.8–1.2)
Prothrombin Time: 17.2 seconds — ABNORMAL HIGH (ref 11.4–15.2)

## 2019-02-14 LAB — GLUCOSE, CAPILLARY
Glucose-Capillary: 137 mg/dL — ABNORMAL HIGH (ref 70–99)
Glucose-Capillary: 152 mg/dL — ABNORMAL HIGH (ref 70–99)
Glucose-Capillary: 132 mg/dL — ABNORMAL HIGH (ref 70–99)
Glucose-Capillary: 141 mg/dL — ABNORMAL HIGH (ref 70–99)

## 2019-02-14 NOTE — Progress Notes (Signed)
DAILY PROGRESS NOTE   Patient Name: Spencer Shelton. Date of Encounter: 02/14/2019 Cardiologist: No primary care provider on file.  Chief Complaint   No issues overnight  Patient Profile   Jakobee Shelton. is a 73 y.o. male with a hx of CHB s/p BiV AICD, chronic systolic heart failure/ischemic cardiomyopathy, CAD s/p CABG in 1982 and redo in 1996, hypertension, hyperlipidemia, type 2 diabetes who is being seen today status post inguinal hernia repair with subsequent hematoma while on coumadin.  Dr. Dwain Sarna is requesting cardiology input on medication management.  Subjective   Vitals stable overnight - labs pending today. BP up and down today.   Objective   Vitals:   02/13/19 2342 02/14/19 0359 02/14/19 0500 02/14/19 0733  BP: (!) 147/68 122/70  (!) 157/73  Pulse: 70 72  84  Resp: 20 20  (!) 23  Temp: 98.1 F (36.7 C) 98 F (36.7 C)  97.8 F (36.6 C)  TempSrc: Oral Oral    SpO2: 97% 96%  98%  Weight:   88.9 kg   Height:       No intake or output data in the 24 hours ending 02/14/19 1033  Filed Weights   02/11/19 2104 02/13/19 0500 02/14/19 0500  Weight: 90.5 kg 93.8 kg 88.9 kg    Physical Exam   General appearance: alert and no distress Neck: no carotid bruit, no JVD and thyroid not enlarged, symmetric, no tenderness/mass/nodules Lungs: clear to auscultation bilaterally Heart: regular rate and rhythm Abdomen: soft, non-tender; bowel sounds normal; no masses,  no organomegaly Extremities: extremities normal, atraumatic, no cyanosis or edema Pulses: 2+ and symmetric Skin: Skin color, texture, turgor normal. No rashes or lesions Neurologic: Grossly normal Psych: Pleasant  Inpatient Medications    Scheduled Meds: . acetaminophen  1,000 mg Oral Q6H  . brimonidine  1 drop Both Eyes BID   And  . timolol  1 drop Both Eyes BID  . docusate sodium  100 mg Oral BID  . doxycycline  100 mg Oral Q12H  . ezetimibe  10 mg Oral Daily  . furosemide  80 mg Oral BID   . insulin aspart  0-15 Units Subcutaneous TID WC  . insulin aspart  0-5 Units Subcutaneous QHS  . latanoprost  1 drop Both Eyes QHS  . metoprolol succinate  25 mg Oral Daily    Continuous Infusions:   PRN Meds: diphenhydrAMINE **OR** diphenhydrAMINE, morphine injection, nitroGLYCERIN, ondansetron **OR** ondansetron (ZOFRAN) IV, polyethylene glycol, simethicone, traMADol   Labs   Results for orders placed or performed during the hospital encounter of 02/11/19 (from the past 48 hour(s))  Glucose, capillary     Status: Abnormal   Collection Time: 02/12/19 12:18 PM  Result Value Ref Range   Glucose-Capillary 194 (H) 70 - 99 mg/dL  Glucose, capillary     Status: Abnormal   Collection Time: 02/12/19  4:32 PM  Result Value Ref Range   Glucose-Capillary 162 (H) 70 - 99 mg/dL  Glucose, capillary     Status: Abnormal   Collection Time: 02/12/19  9:22 PM  Result Value Ref Range   Glucose-Capillary 137 (H) 70 - 99 mg/dL   Comment 1 Notify RN    Comment 2 Document in Chart   CBC     Status: Abnormal   Collection Time: 02/12/19 11:15 PM  Result Value Ref Range   WBC 10.5 4.0 - 10.5 K/uL   RBC 2.83 (L) 4.22 - 5.81 MIL/uL   Hemoglobin 9.2 (L)  13.0 - 17.0 g/dL   HCT 27.4 (L) 39.0 - 52.0 %   MCV 96.8 80.0 - 100.0 fL   MCH 32.5 26.0 - 34.0 pg   MCHC 33.6 30.0 - 36.0 g/dL   RDW 14.1 11.5 - 15.5 %   Platelets 115 (L) 150 - 400 K/uL    Comment: REPEATED TO VERIFY Immature Platelet Fraction may be clinically indicated, consider ordering this additional test [id number] CONSISTENT WITH PREVIOUS RESULT    nRBC 0.0 0.0 - 0.2 %    Comment: Performed at [facility], [place] [address], [place], [place] [address]  Protime-INR     Status: Abnormal   Collection Time: 02/13/19 12:11 AM  Result Value Ref Range   Prothrombin Time 18.5 (H) 11.4 - 15.2 seconds   INR 1.6 (H) 0.8 - 1.2    Comment: (NOTE) INR goal varies based on device and disease states. Performed at [facility],  [place] [address], [place], [place] [address]   Basic metabolic panel     Status: Abnormal   Collection Time: 02/13/19 12:11 AM  Result Value Ref Range   Sodium 139 135 - 145 mmol/L   Potassium 4.1 3.5 - 5.1 mmol/L   Chloride 109 98 - 111 mmol/L   CO2 24 22 - 32 mmol/L   Glucose, Bld 130 (H) 70 - 99 mg/dL   BUN 32 (H) 8 - 23 mg/dL   Creatinine, Ser 1.34 (H) 0.61 - 1.24 mg/dL   Calcium 8.7 (L) 8.9 - 10.3 mg/dL   GFR calc non Af Amer 52 (L) >60 mL/min   GFR calc Af Amer >60 >60 mL/min   Anion gap 6 5 - 15    Comment: Performed at [facility] [address], [place], [place] [address]  Glucose, capillary     Status: Abnormal   Collection Time: 02/13/19  7:43 AM  Result Value Ref Range   Glucose-Capillary 133 (H) 70 - 99 mg/dL  Glucose, capillary     Status: Abnormal   Collection Time: 02/13/19 11:44 AM  Result Value Ref Range   Glucose-Capillary 199 (H) 70 - 99 mg/dL  Glucose, capillary     Status: Abnormal   Collection Time: 02/13/19  5:21 PM  Result Value Ref Range   Glucose-Capillary 151 (H) 70 - 99 mg/dL  Glucose, capillary     Status: Abnormal   Collection Time: 02/13/19  8:46 PM  Result Value Ref Range   Glucose-Capillary 182 (H) 70 - 99 mg/dL  Protime-INR     Status: Abnormal   Collection Time: 02/14/19  5:29 AM  Result Value Ref Range   Prothrombin Time 17.2 (H) 11.4 - 15.2 seconds   INR 1.4 (H) 0.8 - 1.2    Comment: (NOTE) INR goal varies based on device and disease states. Performed at [facility], [place] [address], [place], [place] [address]   Glucose, capillary     Status: Abnormal   Collection Time: 02/14/19  7:31 AM  Result Value Ref Range   Glucose-Capillary 137 (H) 70 - 99 mg/dL   Comment 1 Notify RN    Comment 2 Document in Chart     ECG   N/A  Telemetry   Paced rhythm - Personally Reviewed  Radiology    No results found.  Cardiac Studies   N/A  Assessment   Active Problems:   Atrial fibrillation (HCC)   Chronic systolic heart  failure (HCC)   Biventricular implantable cardioverter-defibrillator in situ  CAD (coronary artery disease), native coronary artery   Hematoma   Hematoma of groin   Pressure injury of skin   AKI (acute kidney injury) (HCC)   Plan   1. Labs pending today- if creatinine stable or improved, restart benezapril 40 mg daily. Plan restart lasix today.   Time Spent Directly with Patient:  I have spent a total of 25 minutes with the patient reviewing hospital notes, telemetry, EKGs, labs and examining the patient as well as establishing an assessment and plan that was discussed personally with the patient.  > 50% of time was spent in direct patient care.  Length of Stay:  LOS: 3 days   Chrystie NoseKenneth C. Butler Vegh, MD, [facility] CenterFACC, FACP  [place]  [facility]  Medical Director of the Advanced Lipid Disorders &  Cardiovascular Risk Reduction Clinic Diplomate of the American Board of Clinical Lipidology Attending Cardiologist  Direct Dial: [phone]  Fax: [phone]  Website:  [email or web address]  Lisette AbuKenneth C Maleiah Dula 02/14/2019, 10:33 AM

## 2019-02-14 NOTE — Progress Notes (Signed)
Patient ID: [name], male   DOB: Oct 28, 1945, 73 y.o.   MRN: [id number] [facility] Surgery Progress Note:   3 Days Post-Op  Subjective: Mental status is clear.   Objective: Vital signs in last 24 hours: Temp:  [97.7 F (36.5 C)-98.1 F (36.7 C)] 97.8 F (36.6 C) (08/30 0733) Pulse Rate:  [69-84] 84 (08/30 0733) Resp:  [18-28] 23 (08/30 0733) BP: (100-157)/(54-73) 157/73 (08/30 0733) SpO2:  [95 %-99 %] 98 % (08/30 0733) Weight:  [88.9 kg] 88.9 kg (08/30 0500)  Intake/Output from previous day: No intake/output data recorded. Intake/Output this shift: No intake/output data recorded.  Physical Exam: Work of breathing is not labored.  Ecchymosis is about the same.    Lab Results:  Results for orders placed or performed during the hospital encounter of 02/11/19 (from the past 48 hour(s))  Glucose, capillary     Status: Abnormal   Collection Time: 02/12/19 12:18 PM  Result Value Ref Range   Glucose-Capillary 194 (H) 70 - 99 mg/dL  Glucose, capillary     Status: Abnormal   Collection Time: 02/12/19  4:32 PM  Result Value Ref Range   Glucose-Capillary 162 (H) 70 - 99 mg/dL  Glucose, capillary     Status: Abnormal   Collection Time: 02/12/19  9:22 PM  Result Value Ref Range   Glucose-Capillary 137 (H) 70 - 99 mg/dL   Comment 1 Notify RN    Comment 2 Document in Chart   CBC     Status: Abnormal   Collection Time: 02/12/19 11:15 PM  Result Value Ref Range   WBC 10.5 4.0 - 10.5 K/uL   RBC 2.83 (L) 4.22 - 5.81 MIL/uL   Hemoglobin 9.2 (L) 13.0 - 17.0 g/dL   HCT 14.7 (L) 82.9 - 56.2 %   MCV 96.8 80.0 - 100.0 fL   MCH 32.5 26.0 - 34.0 pg   MCHC 33.6 30.0 - 36.0 g/dL   RDW 13.0 86.5 - 78.4 %   Platelets 115 (L) 150 - 400 K/uL    Comment: REPEATED TO VERIFY Immature Platelet Fraction may be clinically indicated, consider ordering this additional test [id number] CONSISTENT WITH PREVIOUS RESULT    nRBC 0.0 0.0 - 0.2 %    Comment: Performed at [facility],  [address] [place], [place] [address]  Protime-INR     Status: Abnormal   Collection Time: 02/13/19 12:11 AM  Result Value Ref Range   Prothrombin Time 18.5 (H) 11.4 - 15.2 seconds   INR 1.6 (H) 0.8 - 1.2    Comment: (NOTE) INR goal varies based on device and disease states. Performed at [facility], [address], [place], [place] [address]   Basic metabolic panel     Status: Abnormal   Collection Time: 02/13/19 12:11 AM  Result Value Ref Range   Sodium 139 135 - 145 mmol/L   Potassium 4.1 3.5 - 5.1 mmol/L   Chloride 109 98 - 111 mmol/L   CO2 24 22 - 32 mmol/L   Glucose, Bld 130 (H) 70 - 99 mg/dL   BUN 32 (H) 8 - 23 mg/dL   Creatinine, Ser 7.25 (H) 0.61 - 1.24 mg/dL   Calcium 8.7 (L) 8.9 - 10.3 mg/dL   GFR calc non Af Amer 52 (L) >60 mL/min   GFR calc Af Amer >60 >60 mL/min   Anion gap 6 5 - 15    Comment: Performed at [facility], [address],  [place], [place] [address]  Glucose, capillary     Status: Abnormal   Collection Time: 02/13/19  7:43 AM  Result Value Ref Range   Glucose-Capillary 133 (H) 70 - 99 mg/dL  Glucose, capillary     Status: Abnormal   Collection Time: 02/13/19 11:44 AM  Result Value Ref Range   Glucose-Capillary 199 (H) 70 - 99 mg/dL  Glucose, capillary     Status: Abnormal   Collection Time: 02/13/19  5:21 PM  Result Value Ref Range   Glucose-Capillary 151 (H) 70 - 99 mg/dL  Glucose, capillary     Status: Abnormal   Collection Time: 02/13/19  8:46 PM  Result Value Ref Range   Glucose-Capillary 182 (H) 70 - 99 mg/dL  Protime-INR     Status: Abnormal   Collection Time: 02/14/19  5:29 AM  Result Value Ref Range   Prothrombin Time 17.2 (H) 11.4 - 15.2 seconds   INR 1.4 (H) 0.8 - 1.2    Comment: (NOTE) INR goal varies based on device and disease states. Performed at [facility], [place] [address], [place], [place] [address]   Glucose, capillary     Status: Abnormal   Collection Time: 02/14/19  7:31 AM  Result Value Ref Range    Glucose-Capillary 137 (H) 70 - 99 mg/dL   Comment 1 Notify RN    Comment 2 Document in Chart     Radiology/Results: No results found.  Anti-infectives: Anti-infectives (From admission, onward)   Start     Dose/Rate Route Frequency Ordered Stop   02/12/19 1000  doxycycline (VIBRA-TABS) tablet 100 mg     100 mg Oral Every 12 hours 02/11/19 2041     02/11/19 1920  ciprofloxacin (CIPRO) 400 MG/200ML IVPB    Note to Pharmacy: Lisette Grinder   : cabinet override      02/11/19 1920 02/11/19 1920   02/11/19 1530  ciprofloxacin (CIPRO) IVPB 400 mg     400 mg 200 mL/hr over 60 Minutes Intravenous To Surgery 02/11/19 1513 02/11/19 2020      Assessment/Plan: Problem List: Patient Active Problem List   Diagnosis Date Noted  . Pressure injury of skin 02/12/2019  . Hematoma 02/11/2019  . Hematoma of groin 02/11/2019  . Right inguinal hernia 02/08/2019  . Ascites leaking out umbilical incision 12/08/7626  . Calculus of gallbladder with acute cholecystitis s/p lap chole 12/22/2013 12/22/2013  . CAD (coronary artery disease), native coronary artery 10/26/2013  . Hyperlipidemia   . Cirrhosis ([place])   . Ventricular tachycardia (paroxysmal) ([place]) 09/16/2013  . Biventricular implantable cardioverter-defibrillator in situ 04/16/2011  . Essential hypertension, benign 03/28/2010  . Atrial fibrillation ([place]) 03/28/2010  . Chronic systolic heart failure ([place]) 03/28/2010    Hg is 9.4.  Will recheck basic labs in AM in anticipation of adjusting BP meds prior to discharge.   3 Days Post-Op    LOS: 3 days   Matt B. Hassell Done, MD, [facility] [phone] beeper [phone]  02/14/2019 10:00 AM

## 2019-02-15 ENCOUNTER — Telehealth: Payer: Self-pay | Admitting: Pharmacist

## 2019-02-15 DIAGNOSIS — Z5181 Encounter for therapeutic drug level monitoring: Secondary | ICD-10-CM

## 2019-02-15 LAB — CBC WITH DIFFERENTIAL/PLATELET
Abs Immature Granulocytes: 0.07 10*3/uL (ref 0.00–0.07)
Basophils Absolute: 0 10*3/uL (ref 0.0–0.1)
Basophils Relative: 0 %
Eosinophils Absolute: 0.5 10*3/uL (ref 0.0–0.5)
Eosinophils Relative: 4 %
HCT: 31.6 % — ABNORMAL LOW (ref 39.0–52.0)
Hemoglobin: 10.7 g/dL — ABNORMAL LOW (ref 13.0–17.0)
Immature Granulocytes: 1 %
Lymphocytes Relative: 14 %
Lymphs Abs: 1.7 10*3/uL (ref 0.7–4.0)
MCH: 33.1 pg (ref 26.0–34.0)
MCHC: 33.9 g/dL (ref 30.0–36.0)
MCV: 97.8 fL (ref 80.0–100.0)
Monocytes Absolute: 2.2 10*3/uL — ABNORMAL HIGH (ref 0.1–1.0)
Monocytes Relative: 17 %
Neutro Abs: 8.1 10*3/uL — ABNORMAL HIGH (ref 1.7–7.7)
Neutrophils Relative %: 64 %
Platelets: 183 10*3/uL (ref 150–400)
RBC: 3.23 MIL/uL — ABNORMAL LOW (ref 4.22–5.81)
RDW: 14.7 % (ref 11.5–15.5)
WBC: 12.6 10*3/uL — ABNORMAL HIGH (ref 4.0–10.5)
nRBC: 0 % (ref 0.0–0.2)

## 2019-02-15 LAB — PROTIME-INR
INR: 1.4 — ABNORMAL HIGH (ref 0.8–1.2)
Prothrombin Time: 16.6 seconds — ABNORMAL HIGH (ref 11.4–15.2)

## 2019-02-15 LAB — BASIC METABOLIC PANEL
Anion gap: 12 (ref 5–15)
BUN: 23 mg/dL (ref 8–23)
CO2: 22 mmol/L (ref 22–32)
Calcium: 9.6 mg/dL (ref 8.9–10.3)
Chloride: 105 mmol/L (ref 98–111)
Creatinine, Ser: 1.17 mg/dL (ref 0.61–1.24)
GFR calc Af Amer: >60 mL/min (ref >60)
GFR calc non Af Amer: >60 mL/min (ref >60)
Glucose, Bld: 126 mg/dL — ABNORMAL HIGH (ref 70–99)
Potassium: 3.8 mmol/L (ref 3.5–5.1)
Sodium: 139 mmol/L (ref 135–145)

## 2019-02-15 LAB — GLUCOSE, CAPILLARY: Glucose-Capillary: 121 mg/dL — ABNORMAL HIGH (ref 70–99)

## 2019-02-15 NOTE — Discharge Instructions (Signed)
[facility]  UMBILICAL OR INGUINAL HERNIA REPAIR: POST OP INSTRUCTIONS Restart your coumadin at usual dose and benazepril usual dose tomorrow 9/1 Spencer Shelton will call for follow up with me on Friday and then if doing well can return home Will recheck your labs prior to clinic visit also.  Always review your discharge instruction sheet given to you by the facility where your surgery was performed. IF YOU HAVE DISABILITY OR FAMILY LEAVE FORMS, YOU MUST BRING THEM TO THE OFFICE FOR PROCESSING.   DO NOT GIVE THEM TO YOUR DOCTOR.  1. A  prescription for pain medication may be given to you upon discharge.  Take your pain medication as prescribed, if needed.  If narcotic pain medicine is not needed, then you may take acetaminophen (Tylenol), naprosyn (Alleve) or ibuprofen (Advil) as needed. 2. Take your usually prescribed medications unless otherwise directed. 3. If you need a refill on your pain medication, please contact your pharmacy.  They will contact our office to request authorization. Prescriptions will not be filled after 5 pm or on week-ends. 4. You should follow a light diet the first 24 hours after arrival home, such as soup and crackers, etc.  Be sure to include lots of fluids daily.  Resume your normal diet the day after surgery. 5. Most patients will experience some swelling and bruising around the umbilicus or in the groin and scrotum.  Ice packs and reclining will help.  Swelling and bruising can take several days to resolve.  6. It is common to experience some constipation if taking pain medication after surgery.  Increasing fluid intake and taking a stool softener (such as Colace) will usually help or prevent this problem from occurring.  A mild laxative (Milk of Magnesia or Miralax) should be taken according to package directions if there are no bowel movements after 48 hours. 7. Unless discharge instructions indicate otherwise, you may remove your bandages 48 hours  after surgery, and you may shower at that time.  You may have steri-strips (small skin tapes) in place directly over the incision.  These strips should be left on the skin for 7-10 days and will come off on their own.  If your surgeon used skin glue on the incision, you may shower in 24 hours.  The glue will flake off over the next 2-3 weeks.  Any sutures or staples will be removed at the office during your follow-up visit. 8. ACTIVITIES:  You may resume regular (light) daily activities beginning the next day--such as daily self-care, walking, climbing stairs--gradually increasing activities as tolerated.  You may have sexual intercourse when it is comfortable.  Refrain from any heavy lifting or straining until approved by your doctor. a. You may drive when you are no longer taking prescription pain medication, you can comfortably wear a seatbelt, and you can safely maneuver your car and apply brakes. b. RETURN TO WORK:  __________________________________________________________ 9. You should see your doctor in the office for a follow-up appointment approximately 2-3 weeks after your surgery.  Make sure that you call for this appointment within a day or two after you arrive home to insure a convenient appointment time. 10. OTHER INSTRUCTIONS:  __________________________________________________________________________________________________________________________________________________________________________________________  WHEN TO CALL YOUR DOCTOR: 1. Fever over 101.0 2. Inability to urinate 3. Nausea and/or vomiting 4. Extreme swelling or bruising 5. Continued bleeding from incision. 6. Increased pain, redness, or drainage from the incision  The clinic staff is available to answer your questions during regular business hours.  Please  dont hesitate to call and ask to speak to one of the nurses for clinical concerns.  If you have a medical emergency, go to the nearest emergency room or call 911.   A surgeon from [facility] is always on call at the hospital   [address], [place], [place], [place]  [address] ?  [address], [place], [place]   [address] [phone] ? [phone] ? FAX [phone] Web site: [email or web address]

## 2019-02-15 NOTE — Progress Notes (Signed)
Physical Therapy Treatment Patient Details Name: Spencer MAVITY Sr. MRN: [id number] DOB: [birth year] Today's Date: 02/15/2019    History of Present Illness Pt is a 73 y.o. male who underwent a right inguinal hernia repair on Monday 02/08/19. He was discharged home 02/09/19. He fell at home due to dizziness. He presented to the ED 02/11/19 with c/o dizziness and weakness. Hgb 6.8. He underwent right groin hematoma evacuation 02/11/19 and received 2 units PRBC.    PT Comments    Patient progressing well towards PT goals. Reports no pain today. Able to sit EOB and donn/doff socks/shoes and special ankle brace needed for ankle instability (baseline/chronic) with some assist and increased time. Tolerated gait training with supervision for safety, using IV pole for support to simulate use of SPC which pt uses at baseline. Eager to return home today. Encouraged sitting in chair for all meals. Will continue to follow.    Follow Up Recommendations  No PT follow up;Supervision for mobility/OOB     Equipment Recommendations  None recommended by PT    Recommendations for Other Services       Precautions / Restrictions Precautions Precautions: Fall Required Braces or Orthoses: Other Brace Other Brace: Rt ankle brace due to chronic ankle instability Restrictions Weight Bearing Restrictions: No    Mobility  Bed Mobility Overal bed mobility: Modified Independent             General bed mobility comments: Use of rail for support; increased time, no assist needed.  Transfers Overall transfer level: Needs assistance Equipment used: None Transfers: Sit to/from Stand Sit to Stand: Supervision         General transfer comment: Supervision for safety. Stood from [employer].  Ambulation/Gait Ambulation/Gait assistance: Supervision Gait Distance (Feet): 220 Feet Assistive device: IV Pole Gait Pattern/deviations: Step-through pattern;Trunk flexed;Decreased dorsiflexion - right Gait velocity:  good speed Gait velocity interpretation: 1.31 - 2.62 ft/sec, indicative of limited community ambulator General Gait Details: Pt with noted right foot external rotation (baseline), relying on IV pole for support to simulate SPC.   Stairs             Wheelchair Mobility    Modified Rankin (Stroke Patients Only)       Balance Overall balance assessment: Needs assistance Sitting-balance support: No upper extremity supported;Feet supported Sitting balance-Leahy Scale: Good Sitting balance - Comments: Able to donn/doff socks and shoes with assist needed to lace up brace, no LOB noted.   Standing balance support: During functional activity Standing balance-Leahy Scale: Fair                              Cognition Arousal/Alertness: Awake/alert Behavior During Therapy: WFL for tasks assessed/performed Overall Cognitive Status: Within Functional Limits for tasks assessed                                        Exercises      General Comments General comments (skin integrity, edema, etc.): BP stable.      Pertinent Vitals/Pain Pain Assessment: Faces Faces Pain Scale: No hurt    Home Living                      Prior Function            PT Goals (current goals can now be found in the care  plan section) Progress towards PT goals: Progressing toward goals    Frequency    Min 3X/week      PT Plan Current plan remains appropriate    Co-evaluation              AM-PAC PT "6 Clicks" Mobility   Outcome Measure  Help needed turning from your back to your side while in a flat bed without using bedrails?: None Help needed moving from lying on your back to sitting on the side of a flat bed without using bedrails?: None Help needed moving to and from a bed to a chair (including a wheelchair)?: None Help needed standing up from a chair using your arms (e.g., wheelchair or bedside chair)?: None Help needed to walk in hospital  room?: None Help needed climbing 3-5 steps with a railing? : A Little 6 Click Score: 23    End of Session Equipment Utilized During Treatment: Gait belt Activity Tolerance: Patient tolerated treatment well Patient left: in bed;with call bell/phone within reach;with bed alarm set Nurse Communication: Mobility status PT Visit Diagnosis: Unsteadiness on feet (R26.81)     Time: 7341-9379 PT Time Calculation (min) (ACUTE ONLY): 29 min  Charges:  $Gait Training: 8-22 mins $Therapeutic Activity: 8-22 mins                     Wray Kearns, PT, DPT Acute Rehabilitation Services Pager [phone] Office [place] 02/15/2019, 12:03 PM

## 2019-02-15 NOTE — Progress Notes (Signed)
4 Days Post-Op   Subjective/Chief Complaint: Doing great, wants to go, tol diet   Objective: Vital signs in last 24 hours: Temp:  [97.6 F (36.4 C)-98 F (36.7 C)] 97.8 F (36.6 C) (08/31 0758) Pulse Rate:  [72-101] 101 (08/31 0758) Resp:  [18-25] 18 (08/30 2342) BP: (124-138)/(63-79) 138/79 (08/31 0758) SpO2:  [96 %-99 %] 98 % (08/31 0758) Weight:  [88.8 kg] 88.8 kg (08/31 0500) Last BM Date: (PTA)  Intake/Output from previous day: 08/30 0701 - 08/31 0700 In: 480 [P.O.:480] Out: 200 [Urine:200] Intake/Output this shift: No intake/output data recorded.  Right groin with resolving ecchymosis abd soft nt   Lab Results:  Recent Labs    02/12/19 2315 02/15/19 0742  WBC 10.5 12.6*  HGB 9.2* 10.7*  HCT 27.4* 31.6*  PLT 115* 183   BMET Recent Labs    02/13/19 0011 02/15/19 0742  NA 139 139  K 4.1 3.8  CL 109 105  CO2 24 22  GLUCOSE 130* 126*  BUN 32* 23  CREATININE 1.34* 1.17  CALCIUM 8.7* 9.6   PT/INR Recent Labs    02/14/19 0529 02/15/19 0742  LABPROT 17.2* 16.6*  INR 1.4* 1.4*   ABG No results for input(s): PHART, HCO3 in the last 72 hours.  Invalid input(s): PCO2, PO2  Studies/Results: No results found.  Anti-infectives: Anti-infectives (From admission, onward)   Start     Dose/Rate Route Frequency Ordered Stop   02/12/19 1000  doxycycline (VIBRA-TABS) tablet 100 mg     100 mg Oral Every 12 hours 02/11/19 2041     02/11/19 1920  ciprofloxacin (CIPRO) 400 MG/200ML IVPB    Note to Pharmacy: Lisette Grinder   : cabinet override      02/11/19 1920 02/11/19 1920   02/11/19 1530  ciprofloxacin (CIPRO) IVPB 400 mg     400 mg 200 mL/hr over 60 Minutes Intravenous To Surgery 02/11/19 1513 02/11/19 2020      Assessment/Plan: POD 7/4 RIH/hematoma evacuation- Kehlani Vancamp 1. Neuro- tylenol only,  had fall while on oxycodone at home so will minimize narcotics 2. Pulm toilet, oob today, PT consult says no f/u needed 3. CV- restart home meds, will  hold coumadin until later this week, will await cards to see today- I am planning on discharge so will wait until they give recs 4. Renal- Cr normal today 5. GI-  carb modified 6. Endo- SSI for now, carb mod diet 7. Heme- hct stable 9. scds  Rolm Bookbinder 02/15/2019

## 2019-02-15 NOTE — Progress Notes (Signed)
Progress Note  Patient Name: Spencer CHERY Sr. Date of Encounter: 02/15/2019  Primary Cardiologist: Previously Dr. Wynonia Lawman, transitioned to Dr. Laveda Norman in [place], [place] Seen by Candee Furbish MD this admission  Primary electrophysiologist: Cristopher Peru MD  Subjective   Feels well, wants to discharge home  Inpatient Medications    Scheduled Meds: . acetaminophen  1,000 mg Oral Q6H  . brimonidine  1 drop Both Eyes BID   And  . timolol  1 drop Both Eyes BID  . docusate sodium  100 mg Oral BID  . doxycycline  100 mg Oral Q12H  . ezetimibe  10 mg Oral Daily  . furosemide  80 mg Oral BID  . insulin aspart  0-15 Units Subcutaneous TID WC  . insulin aspart  0-5 Units Subcutaneous QHS  . latanoprost  1 drop Both Eyes QHS  . metoprolol succinate  25 mg Oral Daily   Continuous Infusions:  PRN Meds: diphenhydrAMINE **OR** diphenhydrAMINE, morphine injection, nitroGLYCERIN, ondansetron **OR** ondansetron (ZOFRAN) IV, polyethylene glycol, simethicone, traMADol   Vital Signs    Vitals:   02/14/19 2052 02/14/19 2342 02/15/19 0500 02/15/19 0758  BP: 137/68 124/69  138/79  Pulse: 90 73  (!) 101  Resp: 19 18    Temp: 97.7 F (36.5 C) 97.6 F (36.4 C)  97.8 F (36.6 C)  TempSrc: Oral Oral  Oral  SpO2: 98% 99%  98%  Weight:   88.8 kg   Height:        Intake/Output Summary (Last 24 hours) at 02/15/2019 1114 Last data filed at 02/14/2019 1700 Gross per 24 hour  Intake 360 ml  Output -  Net 360 ml   Last 3 Weights 02/15/2019 02/14/2019 02/13/2019  Weight (lbs) 195 lb 12.3 oz 195 lb 15.8 oz 206 lb 12.7 oz  Weight (kg) 88.8 kg 88.9 kg 93.8 kg      Telemetry    V-pacing, PVCs with walking - Personally Reviewed  ECG    No new tracings - Personally Reviewed  Physical Exam   GEN: No acute distress.   Neck: No JVD Cardiac: RRR, no murmurs, rubs, or gallops.  Respiratory: Clear to auscultation bilaterally. GI: Soft, nontender, non-distended  MS: No edema; No  deformity. Neuro:  Nonfocal  Psych: Normal affect   Labs    High Sensitivity Troponin:  No results for input(s): TROPONINIHS in the last 720 hours.    Chemistry Recent Labs  Lab 02/12/19 0304 02/13/19 0011 02/15/19 0742  NA 137 139 139  K 3.9 4.1 3.8  CL 104 109 105  CO2 24 24 22   GLUCOSE 147* 130* 126*  BUN 41* 32* 23  CREATININE 1.65* 1.34* 1.17  CALCIUM 8.6* 8.7* 9.6  GFRNONAA 41* 52* >60  GFRAA 47* >60 >60  ANIONGAP 9 6 12      Hematology Recent Labs  Lab 02/12/19 0304 02/12/19 2315 02/15/19 0742  WBC 12.4* 10.5 12.6*  RBC 2.87* 2.83* 3.23*  HGB 9.4* 9.2* 10.7*  HCT 27.6* 27.4* 31.6*  MCV 96.2 96.8 97.8  MCH 32.8 32.5 33.1  MCHC 34.1 33.6 33.9  RDW 13.8 14.1 14.7  PLT 101* 115* 183    BNPNo results for input(s): BNP, PROBNP in the last 168 hours.   DDimer No results for input(s): DDIMER in the last 168 hours.   Radiology    No results found.  Cardiac Studies   TTE 08/06/18 : Summary: 1. LV systolic function is moderately reduced. 2. The anterior wall appears moderately  hypokinetic. 3. The apex appears severely hypokinetic. 4. Estimated left ventricle ejection fraction is 30 to 35%. 5. There is a pacemaker wire in the RV. 6. Left atrium chamber is moderately dilated. 7. LA diameter is 5.4 cm. 8. The aortic valve is mildly calcified. 9. There is mild mitral annular calcification. 10. There is moderate mitral regurgitation. 11. There is mild tricuspid rotation. 12. The estimated RV systolic pressure is normal. 13. The estimated RV systolic pressure is 32 mmHg. 14. The size of the visualized portion of the aortic root is within normal limits.  Carotid US 08/18/2017: Final Interpretation: Right Carotid: Velocities in the right ICA are consistent with a 1-39% stenosis.  Left Carotid: Velocities in the left ICA are consistent with a 1-39% stenosis.  The ECA appears >50% stenosed. Calcific plaque may obscure higher velocity  bilaterally .  Vertebrals: Both vertebral arteries were patent with antegrade flow.  Patient Profile     73 y.o. male with a hx of CHB s/p BiV AICD, chronic systolic heart failure/ischemic cardiomyopathy, CAD s/p CABG in 1982 and redo in 1996, hypertension, hyperlipidemia, type 2 diabetes who is being seen today status post inguinal hernia repair with subsequent hematoma while on coumadin.  Dr. Dwain Sarna is requesting cardiology input on medication management.  Assessment & Plan    1. Chronic systolic heart failure 2. Ischemic cardiomyopathy 3. CAD s/p remote CABG with redo in 1996 - not in an acute exacerbation - resume home lasix 80 mg BID, continue home BB - renal function improved - 1.17 (1.34), baseline 0.9-1.14 - restart home benazepril tomorrow - no ASA given coumadin   4. Paroxysmal Atrial fibrillation 5. Hx of CHB s/p ICD in place - restart coumadin when safe to do so per surgery - last ICD checks on 04/21/18 and 07/21/18 without any Afib occurrences   His cardiologist [facility] follows his INR. If he is able to return to [facility] prior to Friday, he may check his INR there. If he needs to stay in [place] for surgical follow up, will need to make an INR appt at [facility] will sign off.   Medication Recommendations:  As above Other recommendations (labs, testing, etc):  INR follow up Follow up as an outpatient:  INR and cards  For questions or updates, please contact [facility] Please consult [email or web address] for contact info under        Signed, Marcelino Duster, PA  02/15/2019, 11:14 AM

## 2019-02-15 NOTE — Telephone Encounter (Signed)
Patient may come Friday for INR lab draw at the lab

## 2019-02-15 NOTE — Progress Notes (Signed)
IV removed, discharge instructions reviewed with patient and wife.  Patient will discharge home with wife.

## 2019-02-18 ENCOUNTER — Other Ambulatory Visit: Payer: Self-pay

## 2019-02-18 ENCOUNTER — Other Ambulatory Visit: Payer: Medicare Other | Admitting: *Deleted

## 2019-02-18 ENCOUNTER — Telehealth: Payer: Self-pay | Admitting: Pharmacist

## 2019-02-18 DIAGNOSIS — Z5181 Encounter for therapeutic drug level monitoring: Secondary | ICD-10-CM

## 2019-02-18 LAB — PROTIME-INR
INR: 1.1 (ref 0.8–1.2)
Prothrombin Time: 11.6 s (ref 9.1–12.0)

## 2019-02-18 NOTE — Telephone Encounter (Signed)
Called pt, he resumed warfarin 2 days ago. Normal warfarin dose is 5mg  daily except 7.5mg  on Mondays. States his cardiologist in [facility] wanted him to take 10mg  x1 day, then 7.5mg  x1 day before resuming normal warfarin dose. Pt states Dr Donne Hazel wanted him to resume at a lower dose of 5mg  daily which is what pt has taken the past 2 days. He sees Dr Donne Hazel tomorrow at 11:30am for follow up, then is heading back to [facility] if given the go ahead, where his cardiologist manages his warfarin. Since INR remains very low today at 1.1, advised pt to take10mg  of warfarin today then 7.5mg  tomorrow, then resume normal dosing and follow up closely with managing MD next week. Provided him with Coumadin clinic # in case he ends up needing to stay in [place] longer for follow up (can add on another lab INR order early next week if needed). Pt also mentions his legs have been very swollen since yesterday. He has been elevating them, reports pitting edema. Denies pain and redness, both legs look about the same size. Sounds more like edema than DVT however INR is very low. Advised him to take an extra dose of Lasix tonight and to call over to Dr Cristal Generous office for follow up. He verbalized understanding.

## 2019-02-19 ENCOUNTER — Other Ambulatory Visit: Payer: Medicare Other

## 2019-02-19 NOTE — Discharge Summary (Signed)
Physician Discharge Summary  Patient ID: Spencer Shelton MRN: [id number] DOB/AGE: 12-07-1945 73 y.o.  Admit date: 02/11/2019 Discharge date: 02/19/2019  Admission Diagnoses: Right groin hematoma s/p rih repair DM CAD with CHF/afib  Discharge Diagnoses:  Active Problems:   Atrial fibrillation (HCC)   Chronic systolic heart failure (HCC)   Biventricular implantable cardioverter-defibrillator in situ   CAD (coronary artery disease), native coronary artery   Hematoma   Hematoma of groin   Pressure injury of skin   AKI (acute kidney injury) ([place])   Discharged Condition: good  Hospital Course: 73 yom I fixed a right groin hernia on then restarted coumadin. He fell and developed a large hematoma.  I evacuated this in OR and gave him some prbcs also. He did well postop and his creatinine returned to normal.  He was discharged.  Consults: cardiology  Significant Diagnostic Studies: none  Treatments: surgery: evacuation right groin hematoma   Disposition: home   Allergies as of 02/15/2019      Reactions   Statins Swelling, Other (See Comments)   "hurt all over"   Fenofibrate Rash   Penicillins Rash   Did it involve swelling of the face/tongue/throat, SOB, or low BP? No Did it involve sudden or severe rash/hives, skin peeling, or any reaction on the inside of your mouth or nose? No Did you need to seek medical attention at a hospital or doctor's office? No When did it last happen?30 +years If all above answers are "NO", may proceed with cephalosporin use.   Sulfonamide Derivatives Rash      Medication List    STOP taking these medications   oxyCODONE 5 MG immediate release tablet Commonly known as: Oxy IR/ROXICODONE     TAKE these medications   benazepril 40 MG tablet Commonly known as: LOTENSIN Take 40 mg by mouth daily.   CINNAMON PO Take 1 tablet by mouth 2 (two) times daily.   Combigan 0.2-0.5 % ophthalmic solution Generic drug:  brimonidine-timolol Place 1 drop into both eyes 2 (two) times daily.   COQ-10 PO Take 1 tablet by mouth daily.   ezetimibe 10 MG tablet Commonly known as: ZETIA Take 10 mg by mouth daily.   furosemide 80 MG tablet Commonly known as: LASIX TAKE 1 TABLET BY MOUTH TWICE DAILY What changed: when to take this   LIQUID TEARS OP Place 1 drop into both eyes daily as needed (dry eyes).   metoprolol succinate 25 MG 24 hr tablet Commonly known as: TOPROL-XL Take 25 mg by mouth daily.   multivitamin with minerals tablet Take 1 tablet by mouth daily.   nitroGLYCERIN 0.4 MG SL tablet Commonly known as: NITROSTAT Place 0.4 mg under the tongue every 5 (five) minutes x 3 doses as needed for chest pain.   polyethylene glycol 17 g packet Commonly known as: MIRALAX / GLYCOLAX Take 17 g by mouth daily as needed (constipation).   Travatan Z 0.004 % Soln ophthalmic solution Generic drug: Travoprost (BAK Free) Place 1 drop into both eyes at bedtime.   Vitamin B-12 2500 MCG Subl Place 2,500 mcg under the tongue daily.   warfarin 5 MG tablet Commonly known as: COUMADIN Take as directed by Coumadin clinic What changed:   how much to take  how to take this  when to take this  additional instructions      Follow-up Information    [facility] Follow up.   Specialty: Cardiology Why: Please call if INR is needed in [place].  Contact information: [address], [address] [place] [address] [phone]       Emelia Loron, MD Follow up in 4 day(s).   Specialty: General Surgery Contact information: [address] [place] [address] [phone]           Signed: Emelia Loron 02/19/2019, 9:37 AM

## 2020-07-24 IMAGING — CT CT HEAD WITHOUT CONTRAST
3 of 4 series · 13 of 47 positions shown, 15 images · non-contrast
Comparison: None.

CLINICAL DATA: Head injury.

EXAM:
CT HEAD WITHOUT CONTRAST
TECHNIQUE: Contiguous axial images were obtained from the base of the skull
through the vertex without intravenous contrast.

[Series 3: head without · axial · non-contrast · 0.44mm/px · z∈[-133,+7]mm · 7 of 38 slices shown, 9 images]
[im 5/38  brain]
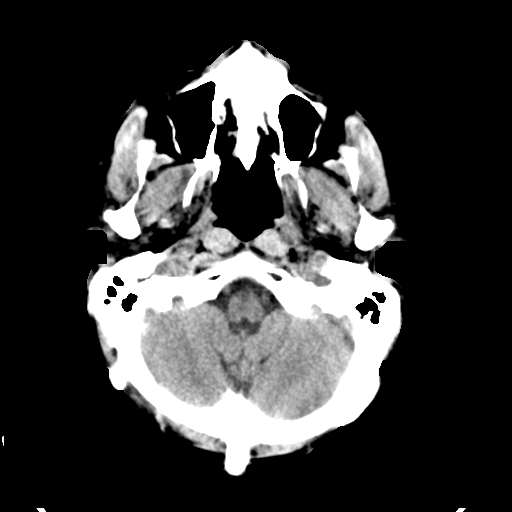
[im 5/38  bone]
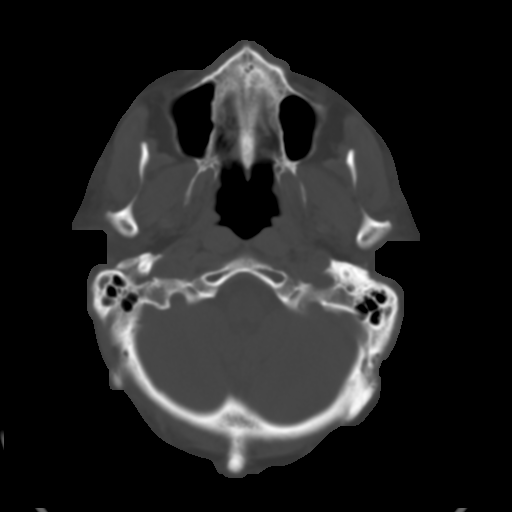
[im 10/38  brain]
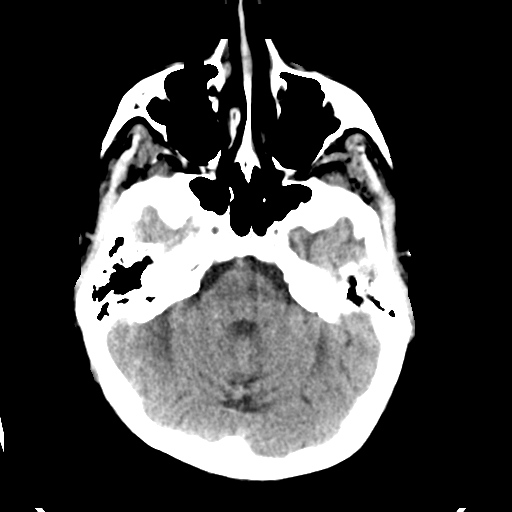
[im 14/38  brain]
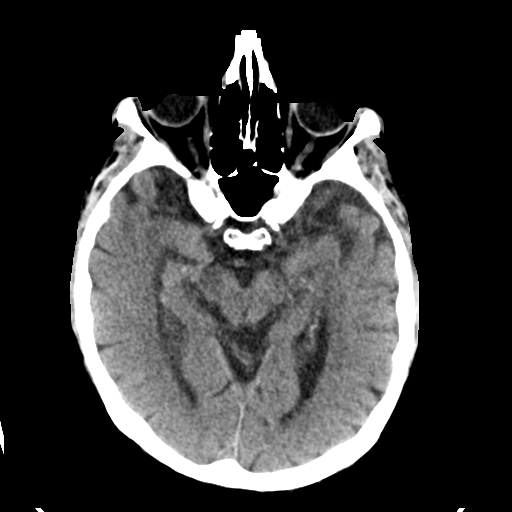
[im 19/38  brain]
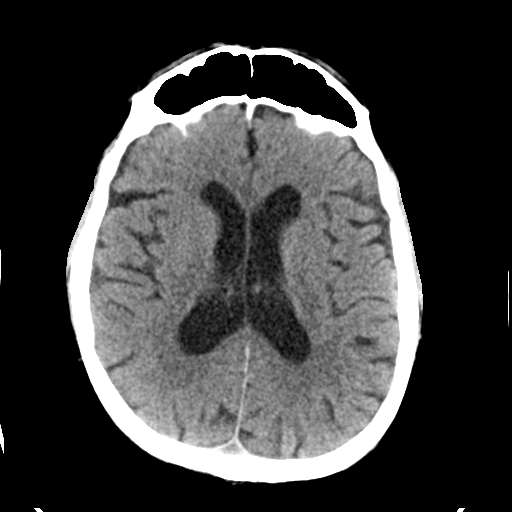
[im 24/38  brain]
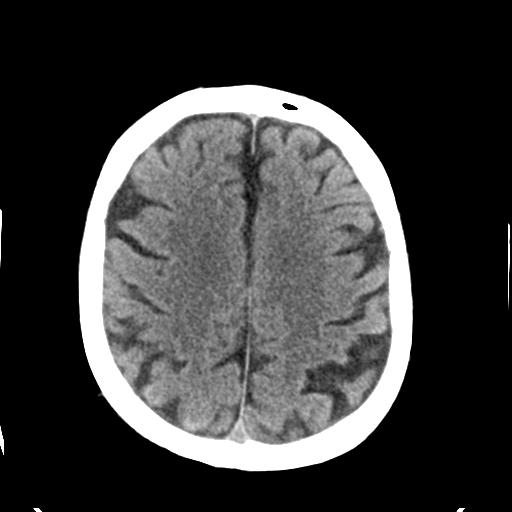
[im 24/38  bone]
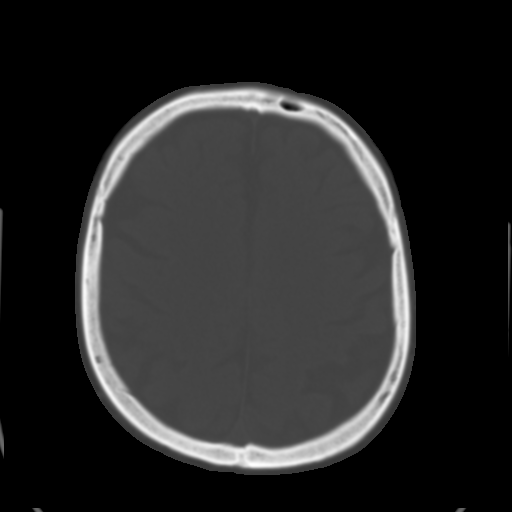
[im 28/38  brain]
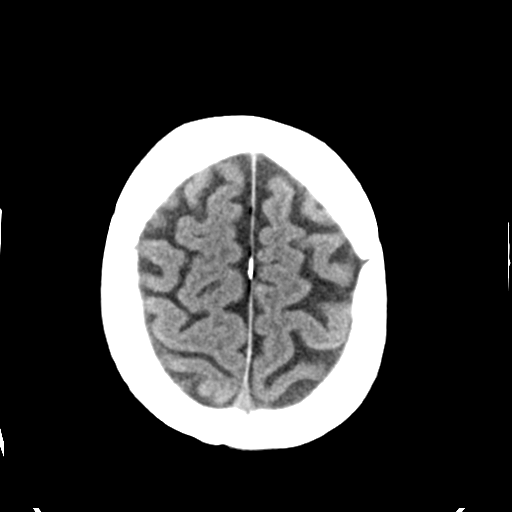
[im 33/38  brain]
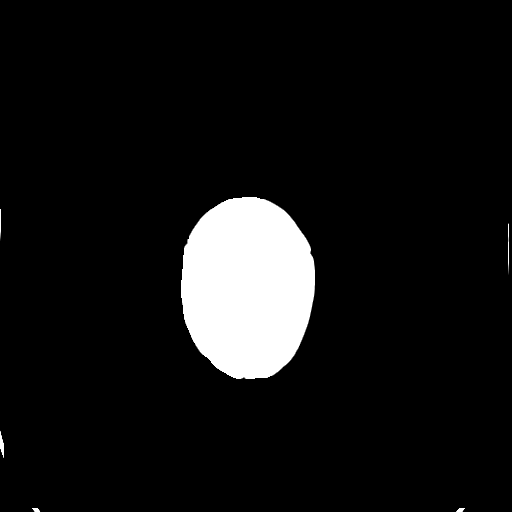

[Series 5: head without cor · coronal · non-contrast · 0.37mm/px · 3 of 80 slices shown]
[im 27/80  brain]
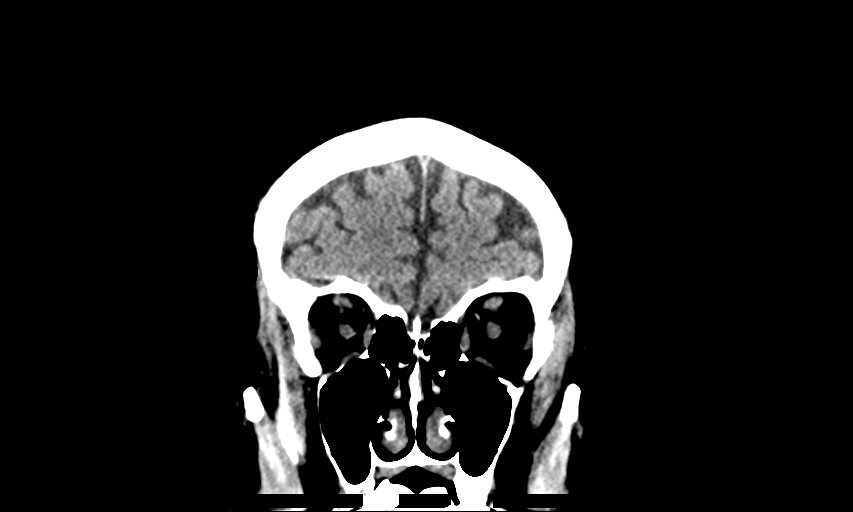
[im 36/80  brain]
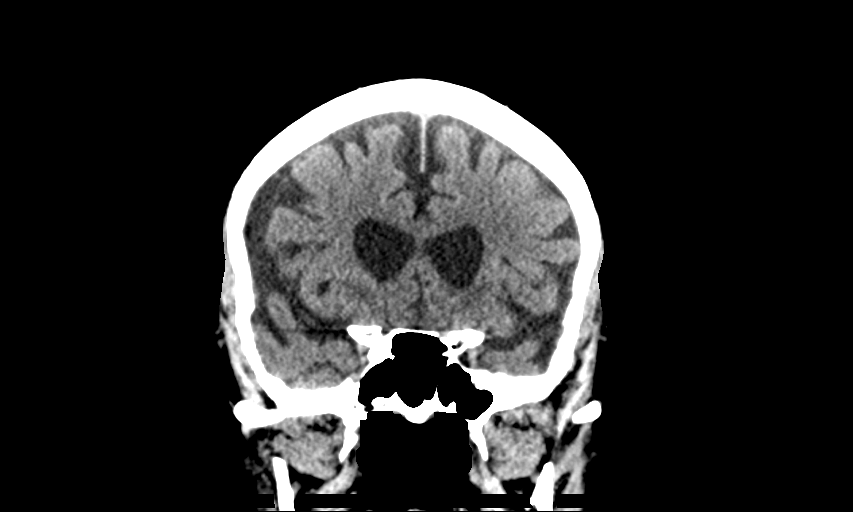
[im 44/80  brain]
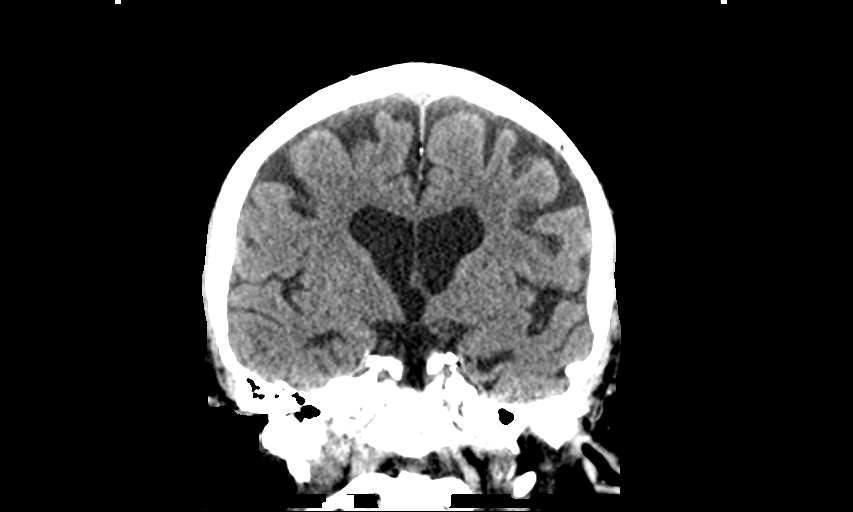

[Series 6: head without sag · sagittal · non-contrast · 0.37mm/px · 3 of 67 slices shown]
[im 23/67  brain]
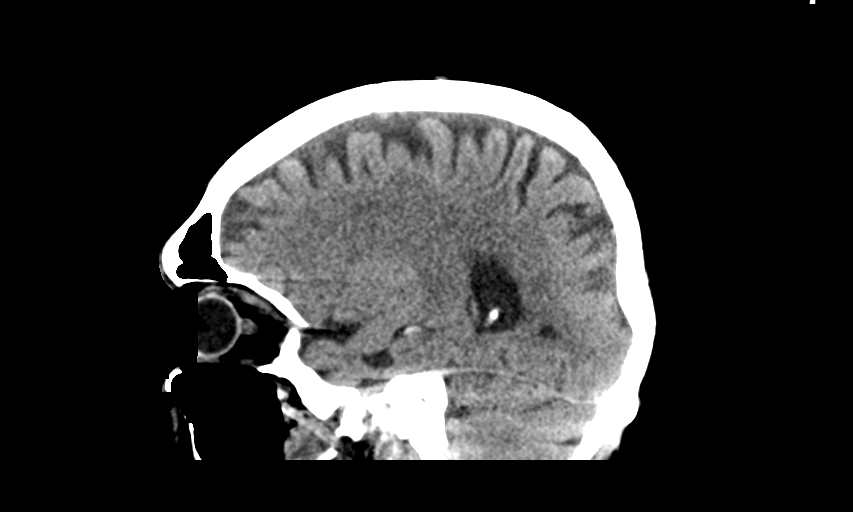
[im 34/67  brain]
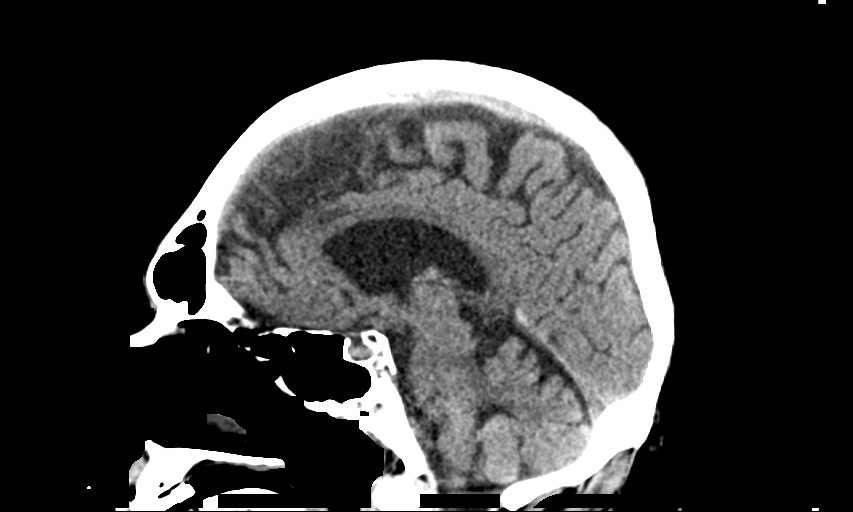
[im 45/67  brain]
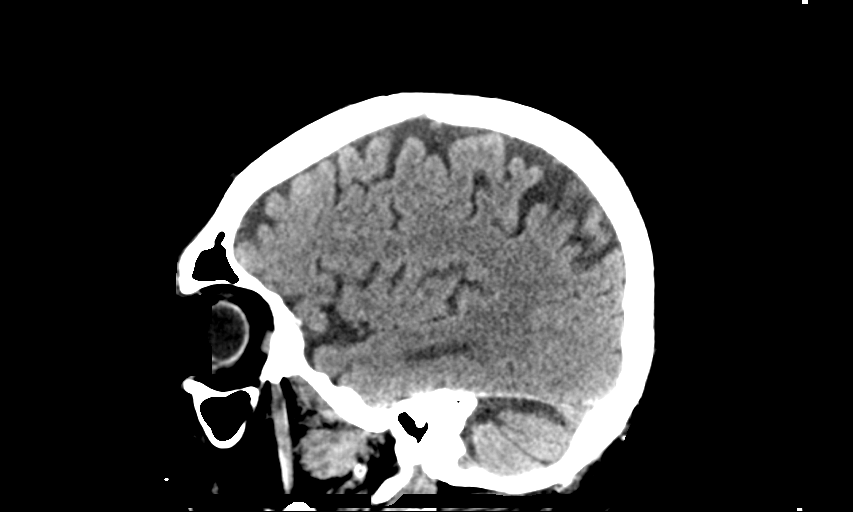

[13 of 47 positions shown; findings below may reference images not displayed]

FINDINGS: Brain: Mild diffuse cortical atrophy is noted. Mild chronic ischemic
white matter disease is noted. No mass effect or midline shift is
noted. Ventricular size is within normal limits. There is no
evidence of mass lesion, hemorrhage or acute infarction.

Vascular: No hyperdense vessel or unexpected calcification.

Skull: Normal. Negative for fracture or focal lesion.

Sinuses/Orbits: No acute finding.

Other: None.
IMPRESSION: Mild diffuse cortical atrophy. Mild chronic ischemic white matter
disease. No acute intracranial abnormality seen.

## 2020-07-24 IMAGING — DX CHEST - 2 VIEW
3 series · 3 of 3 positions shown · non-contrast
Comparison: 05/02/2014 radiographs

CLINICAL DATA: Dizziness.  Recent hernia surgery.

EXAM:
CHEST - 2 VIEW

[w chest lat (1 of 2)]
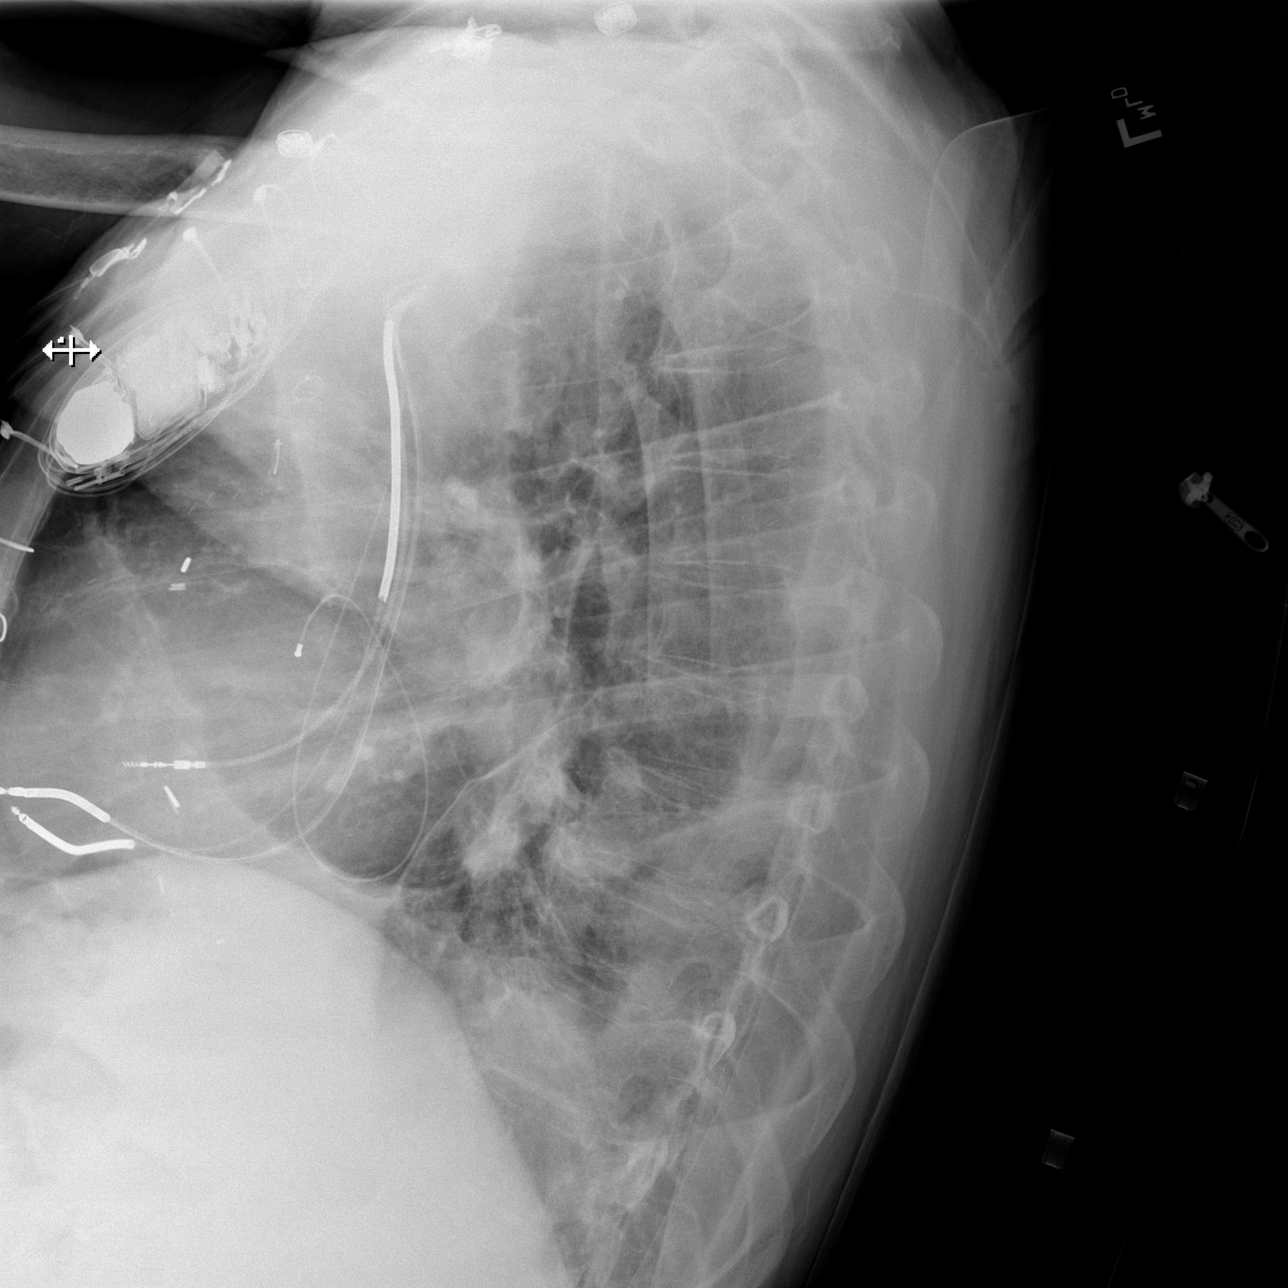

[w chest lat (2 of 2)]
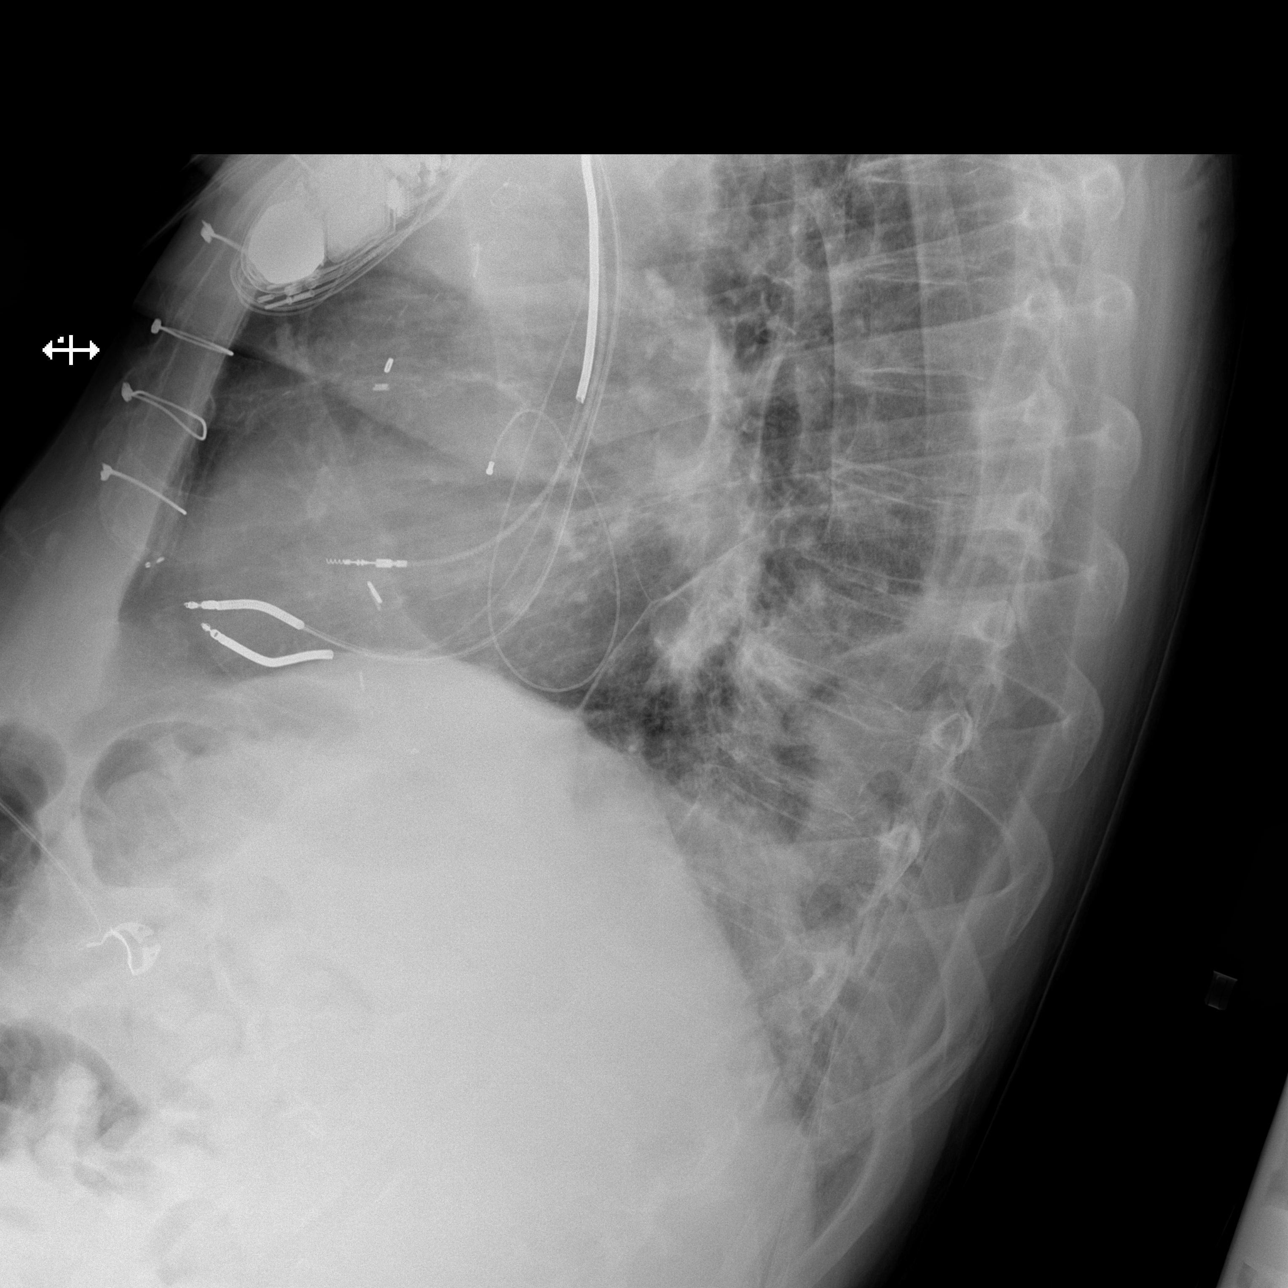

[x chest ap]
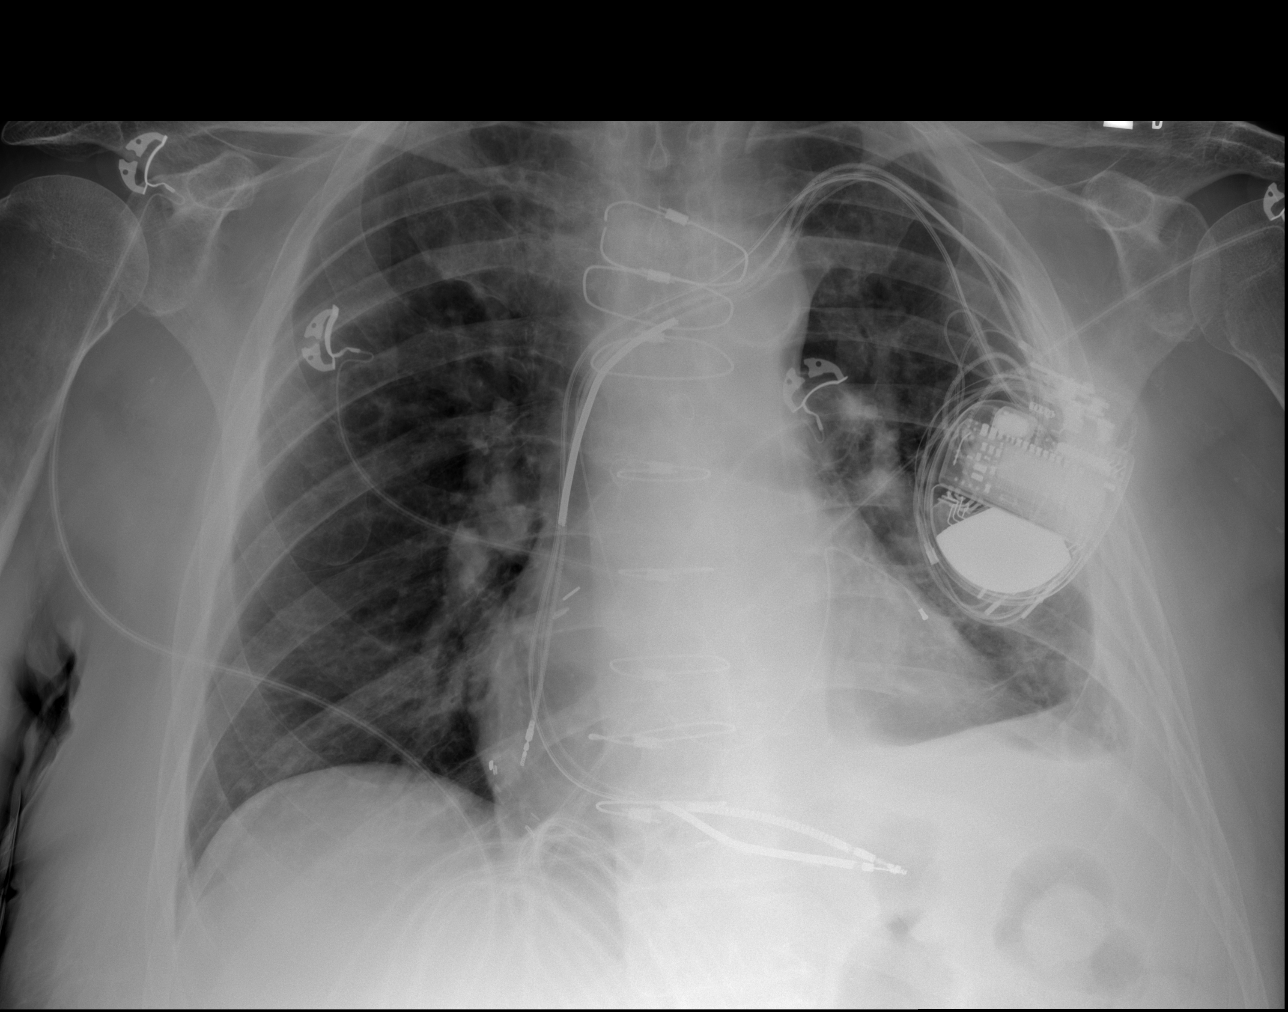

[3 of 3 positions shown; findings below may reference images not displayed]

FINDINGS: Cardiomegaly, CABG changes and LEFT AICD again identified.

LEFT lung and pleuroparenchymal scarring again noted.

There is no evidence of focal airspace disease, pulmonary edema,
suspicious pulmonary nodule/mass, pleural effusion, or pneumothorax.

No acute bony abnormalities are identified.
IMPRESSION: No evidence of acute cardiopulmonary disease.

## 2021-05-17 DEATH — deceased

## 2024-05-21 ENCOUNTER — Encounter (HOSPITAL_COMMUNITY): Payer: Self-pay | Admitting: General Surgery
